# Patient Record
Sex: Female | Born: 1970 | Race: Black or African American | Hispanic: No | Marital: Single | Smoking: Former smoker
Health system: Southern US, Community
[De-identification: ages and names within clinical notes are randomized; demographics above are authoritative.]

## PROBLEM LIST (undated history)

## (undated) DIAGNOSIS — R519 Headache, unspecified: Secondary | ICD-10-CM

## (undated) DIAGNOSIS — I2510 Atherosclerotic heart disease of native coronary artery without angina pectoris: Secondary | ICD-10-CM

## (undated) DIAGNOSIS — I219 Acute myocardial infarction, unspecified: Secondary | ICD-10-CM

## (undated) DIAGNOSIS — O2230 Deep phlebothrombosis in pregnancy, unspecified trimester: Secondary | ICD-10-CM

## (undated) HISTORY — DX: Headache, unspecified: R51.9

---

## 2015-05-09 HISTORY — PX: CORONARY STENT PLACEMENT: SHX1402

## 2017-11-04 ENCOUNTER — Emergency Department (HOSPITAL_COMMUNITY)
Admission: EM | Admit: 2017-11-04 | Discharge: 2017-11-04 | Disposition: A | Discharge status: Home / Self Care | Payer: Self-pay | Attending: Emergency Medicine | Admitting: Emergency Medicine

## 2017-11-04 ENCOUNTER — Encounter (HOSPITAL_COMMUNITY): Payer: Self-pay

## 2017-11-04 ENCOUNTER — Emergency Department (HOSPITAL_BASED_OUTPATIENT_CLINIC_OR_DEPARTMENT_OTHER)
Admit: 2017-11-04 | Discharge: 2017-11-04 | Disposition: A | Discharge status: Home / Self Care | Payer: Self-pay | Attending: Emergency Medicine | Admitting: Emergency Medicine

## 2017-11-04 ENCOUNTER — Other Ambulatory Visit: Payer: Self-pay

## 2017-11-04 ENCOUNTER — Emergency Department (HOSPITAL_COMMUNITY): Payer: Self-pay

## 2017-11-04 DIAGNOSIS — I252 Old myocardial infarction: Secondary | ICD-10-CM | POA: Insufficient documentation

## 2017-11-04 DIAGNOSIS — Z79899 Other long term (current) drug therapy: Secondary | ICD-10-CM | POA: Insufficient documentation

## 2017-11-04 DIAGNOSIS — M79609 Pain in unspecified limb: Secondary | ICD-10-CM

## 2017-11-04 DIAGNOSIS — M79605 Pain in left leg: Secondary | ICD-10-CM | POA: Insufficient documentation

## 2017-11-04 DIAGNOSIS — M7989 Other specified soft tissue disorders: Secondary | ICD-10-CM

## 2017-11-04 HISTORY — DX: Deep phlebothrombosis in pregnancy, unspecified trimester: O22.30

## 2017-11-04 HISTORY — DX: Acute myocardial infarction, unspecified: I21.9

## 2017-11-04 LAB — CBC
HCT: 36.5 % (ref 36.0–46.0)
Hemoglobin: 12.2 g/dL (ref 12.0–15.0)
MCH: 29.7 pg (ref 26.0–34.0)
MCHC: 33.4 g/dL (ref 30.0–36.0)
MCV: 88.8 fL (ref 78.0–100.0)
Platelets: 384 10*3/uL (ref 150–400)
RBC: 4.11 MIL/uL (ref 3.87–5.11)
RDW: 13.3 % (ref 11.5–15.5)
WBC: 5.1 10*3/uL (ref 4.0–10.5)

## 2017-11-04 LAB — BASIC METABOLIC PANEL
Anion gap: 10 (ref 5–15)
BUN: 10 mg/dL (ref 6–20)
CO2: 23 mmol/L (ref 22–32)
Calcium: 9 mg/dL (ref 8.9–10.3)
Chloride: 106 mmol/L (ref 101–111)
Creatinine, Ser: 0.77 mg/dL (ref 0.44–1.00)
GFR calc Af Amer: >60 mL/min (ref >60)
GFR calc non Af Amer: >60 mL/min (ref >60)
Glucose, Bld: 90 mg/dL (ref 65–99)
Potassium: 3.7 mmol/L (ref 3.5–5.1)
Sodium: 139 mmol/L (ref 135–145)

## 2017-11-04 MED ORDER — TRAMADOL HCL 50 MG PO TABS
50.0000 mg | ORAL_TABLET | Freq: Once | ORAL | Status: AC
  Administered 2017-11-04: 50 mg via ORAL
  Filled 2017-11-04: qty 1

## 2017-11-04 MED ORDER — TRAMADOL HCL 50 MG PO TABS: 50.0000 mg | ORAL_TABLET | Freq: Four times a day (QID) | ORAL | 0 refills | Status: DC | PRN

## 2017-11-04 MED ORDER — IBUPROFEN 200 MG PO TABS
600.0000 mg | ORAL_TABLET | Freq: Once | ORAL | Status: AC
  Administered 2017-11-04: 600 mg via ORAL
  Filled 2017-11-04: qty 1

## 2017-11-04 NOTE — ED Triage Notes (Signed)
Pt states she has pain and swelling to her left leg/knee area. She has hx of dvt and wants to be evaluated. She states the pain is worse today and worse with walking, described as sharp pain.

## 2017-11-04 NOTE — Progress Notes (Signed)
VASCULAR LAB PRELIMINARY  PRELIMINARY  PRELIMINARY  PRELIMINARY  Left lower extremity venous duplex completed.    Preliminary report:  There is no DVT noted in the left lower extremity.  There is age indeterminate superficial thrombosis noted throughout the left greater saphenous vein from ankle to just below the saphenofemoral junction.   Called report to Cathie Olden, Kileigh Ortmann, RVT 11/04/2017, 2:49 PM

## 2017-11-04 NOTE — ED Provider Notes (Addendum)
[facility] EMERGENCY DEPARTMENT Provider Note   CSN: [id number] Arrival date & time: 11/04/17  1208     History   Chief Complaint Chief Complaint  Patient presents with  . Leg Pain    HPI Kristina Krause is a 47 y.o. female.  HPI  47 year old female presents today with complaints of left leg pain.  Patient notes a significant past medical history of DVT.  She notes that this was after a pregnancy.  She notes since that time she has had chronic left lower leg swelling.  She notes the swelling in her leg often waxes and wanes but is always present, she notes today swelling is identical to her baseline with no changes.  She notes over the last 4 months she has had worsening pain in the left leg, she notes it originally started in the bottom of her foot, but now is having pain in her knee.  She reports is a constant ache, worse with movement, worse with ambulation.  She denies any significant fever, warmth to touch or redness.  Patient denies any trauma to the knee.  Past Medical History:  Diagnosis Date  . 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 Myocardial infarct (HCC)     There are no active problems to display for this patient.   History reviewed. No pertinent surgical history.   OB History   None      Home Medications    Prior to Admission medications   Medication Sig Start Date End Date Taking? Authorizing Provider  clopidogrel (PLAVIX) 75 MG tablet Take 75 mg by mouth daily.   Yes [provider]  ibuprofen (ADVIL,MOTRIN) 200 MG tablet Take 600 mg by mouth every 6 (six) hours as needed (for pain).   Yes [provider]  traMADol (ULTRAM) 50 MG tablet Take 1 tablet (50 mg total) by mouth every 6 (six) hours as needed. 11/04/17   Eyvonne Mechanic, PA-C    Family History History reviewed. No pertinent family history.  Social History Social History   Tobacco Use  . Smoking status: Never Smoker  . Smokeless tobacco:  Never Used  Substance Use Topics  . Alcohol use: Yes    Comment: occ  . Drug use: Not on file     Allergies   Aspirin   Review of Systems Review of Systems  All other systems reviewed and are negative.  Physical Exam Updated Vital Signs BP 122/63 (BP Location: Left Arm)   Pulse 70   Temp 98.3 F (36.8 C) (Oral)   Resp 18   LMP 11/04/2017   SpO2 100%   Physical Exam  Constitutional: She is oriented to person, place, and time. She appears well-developed and well-nourished.  HENT:  Head: Normocephalic and atraumatic.  Eyes: Pupils are equal, round, and reactive to light. Conjunctivae are normal. Right eye exhibits no discharge. Left eye exhibits no discharge. No scleral icterus.  Neck: Normal range of motion. No JVD present. No tracheal deviation present.  Pulmonary/Chest: Effort normal. No stridor.  Musculoskeletal:  Left leg with edema, no warmth to touch, tenderness in the posterior calf and knee diffusely, no redness, patient has flexion greater than 90 degrees- pedal pulse two plus cap refill less than 2 sec  Neurological: She is alert and oriented to person, place, and time. Coordination normal.  Psychiatric: She has a normal mood and affect. Her behavior is normal. Judgment and thought content normal.  Nursing note and vitals reviewed.  ED Treatments / Results  Labs (all labs ordered are listed, but only abnormal results are displayed) Labs Reviewed  CBC  BASIC METABOLIC PANEL    EKG None  Radiology Dg Knee Complete 4 Views Left  Result Date: 11/04/2017 CLINICAL DATA:  Left knee pain for 3 weeks without known injury. EXAM: LEFT KNEE - COMPLETE 4+ VIEW COMPARISON:  None. FINDINGS: No evidence of fracture, dislocation, or joint effusion. No evidence of arthropathy or other focal bone abnormality. Soft tissues are unremarkable. IMPRESSION: Normal left knee. Electronically Signed   By: Lupita Raider, M.D.   On: 11/04/2017 16:32    Procedures Procedures  (including critical care time)  Medications Ordered in ED Medications  ibuprofen (ADVIL,MOTRIN) tablet 600 mg (600 mg Oral Given 11/04/17 1525)     Initial Impression / Assessment and Plan / ED Course  I have reviewed the triage vital signs and the nursing notes.  Pertinent labs & imaging results that were available during my care of the patient were reviewed by me and considered in my medical decision making (see chart for details).       Labs: CBC, BMP  Imaging: Lower extremity venous, DG knee  Consults:  Therapeutics: Ibuprofen  Discharge Meds: Ultram  Assessment/Plan: 47 year old female presents today with complaints of leg and knee pain.  Patient has chronic edema to the left lower extremity this is unchanged.  She has no signs of infection, no signs of septic arthritis, no DVT.  Patient does have superficial thrombosis.  Patient with good perfusion to the distal extremity.  Patient will have outpatient follow-up with orthopedics, she is given strict return precautions, she verbalized understanding and agreement to today's plan.    Final Clinical Impressions(s) / ED Diagnoses   Final diagnoses:  Left leg pain    ED Discharge Orders        Ordered    traMADol (ULTRAM) 50 MG tablet  Every 6 hours PRN     11/04/17 1708       HedgesTinnie Gens, PA-C 11/04/17 1709    Mykira Hofmeister, Tinnie Gens, PA-C 11/04/17 1718    Pricilla Loveless, MD 11/10/17 [phone]

## 2017-11-04 NOTE — ED Notes (Signed)
Patient transported to X-ray 

## 2017-11-04 NOTE — ED Provider Notes (Signed)
Patient placed in Quick Look pathway, seen and evaluated   Chief Complaint: Pain and swelling of left lower leg  HPI:   Kristina Krause is a 47 y.o. female with history of previous DVT who presents for evaluation of pain and swelling to her left leg.  Patient is not currently on any blood thinners.  Reports she has chronic swelling but over the past several days it has been worsening and she does not typically have pain but reports constant aching pain in the left lower leg.  Denies chest pain or shortness of breath.  ROS: + leg swelling and pain, - fevers or redness, - chest pain, SOB  Physical Exam:   Gen: No distress  Neuro: Awake and Alert  Skin: Warm    Focused Exam: 2+ pitting edema over the left lower leg, no overlying erythema, the leg is tender to palpation, 2+ DP and TP pulses.   Initiation of care has begun. The patient has been counseled on the process, plan, and necessity for staying for the completion/evaluation, and the remainder of the medical screening examination.  DVT study and basic labs ordered from triage.    Dartha Lodge, PA-C 11/04/17 1246    Tegeler, Canary Brim, MD 11/04/17 [phone]

## 2017-11-04 NOTE — Discharge Instructions (Addendum)
Please read attached information. If you experience any new or worsening signs or symptoms please return to the emergency room for evaluation. Please follow-up with your primary care provider or specialist as discussed. Please use medication prescribed only as directed and discontinue taking if you have any concerning signs or symptoms.   °

## 2017-11-04 NOTE — ED Notes (Addendum)
Pt verbalizes understanding of d/c instructions. Pt received prescriptions. Pt taken to lobby in wheelchair at d/c with all belongings.   

## 2017-11-10 ENCOUNTER — Emergency Department (HOSPITAL_COMMUNITY)
Admission: EM | Admit: 2017-11-10 | Discharge: 2017-11-10 | Discharge status: Against Medical Advice | Payer: Self-pay | Attending: Emergency Medicine | Admitting: Emergency Medicine

## 2017-11-10 ENCOUNTER — Emergency Department (HOSPITAL_BASED_OUTPATIENT_CLINIC_OR_DEPARTMENT_OTHER)
Admit: 2017-11-10 | Discharge: 2017-11-10 | Disposition: A | Discharge status: Home / Self Care | Payer: Self-pay | Attending: Emergency Medicine | Admitting: Emergency Medicine

## 2017-11-10 ENCOUNTER — Encounter (HOSPITAL_COMMUNITY): Payer: Self-pay | Admitting: Emergency Medicine

## 2017-11-10 DIAGNOSIS — M79609 Pain in unspecified limb: Secondary | ICD-10-CM

## 2017-11-10 DIAGNOSIS — I8002 Phlebitis and thrombophlebitis of superficial vessels of left lower extremity: Secondary | ICD-10-CM | POA: Insufficient documentation

## 2017-11-10 DIAGNOSIS — Z86718 Personal history of other venous thrombosis and embolism: Secondary | ICD-10-CM | POA: Insufficient documentation

## 2017-11-10 DIAGNOSIS — Z7982 Long term (current) use of aspirin: Secondary | ICD-10-CM | POA: Insufficient documentation

## 2017-11-10 DIAGNOSIS — Z7901 Long term (current) use of anticoagulants: Secondary | ICD-10-CM | POA: Insufficient documentation

## 2017-11-10 MED ORDER — IBUPROFEN 600 MG PO TABS: 600.0000 mg | ORAL_TABLET | Freq: Four times a day (QID) | ORAL | 0 refills | Status: DC | PRN

## 2017-11-10 MED ORDER — ACETAMINOPHEN 325 MG PO TABS
650.0000 mg | ORAL_TABLET | Freq: Once | ORAL | Status: AC
  Administered 2017-11-10: 650 mg via ORAL
  Filled 2017-11-10: qty 2

## 2017-11-10 NOTE — Progress Notes (Signed)
Left lower extremity venous duplex has been completed. Negative for DVT. There is evidence of age indeterminate superficial vein thrombosis involving the great saphenous vein of the left lower extremity. Results were given to Fayrene HelperBowie Tran PA.  11/10/17 2:56 PM Kristina Krause RVT

## 2017-11-10 NOTE — ED Provider Notes (Signed)
[facility] EMERGENCY DEPARTMENT Provider Note   CSN: [id number] Arrival date & time: 11/10/17  0805     History   Chief Complaint Chief Complaint  Patient presents with  . Leg Pain    HPI Kristina Krause is a 47 y.o. female.  HPI   47 year old female with history of prior DVT, anxiety, presenting for evaluation of left knee pain.  Patient states she is always had trouble with her left leg with swelling and pain however it has increased in size and discomfort for more than a week.  Pain is primarily to the back of her left knee and thigh described as a throbbing heavy sensation worsening with prolonged standing.  At rest, pain did improve.  There is no associated fever, chills, lightheadedness, dizziness, chest pain, shortness of breath, productive cough, hemoptysis.  She denies any back pain, bowel bladder incontinence or saddle anesthesia.  No report of numbness or weakness.  Denies any recent injury.  Patient was seen on 11/04/2017 for her symptoms.  X-ray of her left knee, as well as a DVT study was performed which shows no definite evidence of DVT.  However there is evidence of superficial thrombophlebitis.  Patient was discharged home with pain medication.  Return again today stating that her symptoms have not improved.  She has been able to walk much, and currently using crutches.  Is requesting for work note.  Past Medical History:  Diagnosis Date  . 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 Myocardial infarct (HCC)     There are no active problems to display for this patient.   History reviewed. No pertinent surgical history.   OB History   None      Home Medications    Prior to Admission medications   Medication Sig Start Date End Date Taking? Authorizing Provider  aspirin EC 81 MG tablet Take 81 mg by mouth daily.   Yes [provider]  clopidogrel (PLAVIX) 75 MG tablet Take 75 mg by mouth daily.   Yes [provider]    lisinopril (PRINIVIL,ZESTRIL) 2.5 MG tablet Take 2.5 mg by mouth daily.   Yes [provider]  Metoprolol Succinate 25 MG CS24 Take 25 mg by mouth daily.   Yes [provider]  traMADol (ULTRAM) 50 MG tablet Take 1 tablet (50 mg total) by mouth every 6 (six) hours as needed. Patient taking differently: Take 50 mg by mouth every 6 (six) hours as needed for moderate pain.  11/04/17  Yes Hedges, Tinnie Gens, PA-C    Family History No family history on file.  Social History Social History   Tobacco Use  . Smoking status: Never Smoker  . Smokeless tobacco: Never Used  Substance Use Topics  . Alcohol use: Yes    Comment: occ  . Drug use: Not on file     Allergies   Aspirin   Review of Systems Review of Systems  All other systems reviewed and are negative.    Physical Exam Updated Vital Signs BP 115/72 (BP Location: Right Arm)   Pulse 62   Temp 98.2 F (36.8 C) (Oral)   Resp 17   LMP 11/04/2017   SpO2 99%   Physical Exam  Constitutional: She appears well-developed and well-nourished. No distress.  HENT:  Head: Atraumatic.  Eyes: Conjunctivae are normal.  Neck: Neck supple.  Cardiovascular: Normal rate, regular rhythm and intact distal pulses.  Pulmonary/Chest: Effort normal and breath sounds normal.  Musculoskeletal: She exhibits edema (  Left lower extremity: Nonpitting edema throughout  as compared to right, tenderness to popliteal fossa and posterior calf without palpable cords erythema or rash.  Knee is nontender to palpation with normal flexion and extension.).  Left lower extremity compartments are soft  Neurological: She is alert.  Skin: No rash noted.  Psychiatric: She has a normal mood and affect.  Nursing note and vitals reviewed.    ED Treatments / Results  Labs (all labs ordered are listed, but only abnormal results are displayed) Labs Reviewed - No data to display  EKG None  Radiology No results found.  Procedures Procedures  (including critical care time)  Medications Ordered in ED Medications  acetaminophen (TYLENOL) tablet 650 mg (has no administration in time range)     Initial Impression / Assessment and Plan / ED Course  I have reviewed the triage vital signs and the nursing notes.  Pertinent labs & imaging results that were available during my care of the patient were reviewed by me and considered in my medical decision making (see chart for details).     BP 115/72 (BP Location: Right Arm)   Pulse 62   Temp 98.2 F (36.8 C) (Oral)   Resp 17   LMP 11/04/2017   SpO2 99%    Final Clinical Impressions(s) / ED Diagnoses   Final diagnoses:  Superficial thrombophlebitis of left leg    ED Discharge Orders    None     3:53 PM Patient here with continued discomfort and pain involving her left lower extremity.  She was previously diagnosed with superficial thrombophlebitis.  She report history of DVT and was voicing concern.  A repeat ultrasound performed today without evidence of DVT but redemonstrate superficial thrombophlebitis.  Encourage warm compress, anti-inflammatory medication as needed.  Outpatient follow-up with PCP recommended.  Work note provided as requested.  I do not see any evidence of infection.  No significant discomfort with her knee concerning for bony or joint pathology.   Fayrene Helperran, Tymber Stallings, PA-C 11/10/17 1557    Eber HongMiller, Brian, MD 11/11/17 [phone]

## 2017-11-10 NOTE — Discharge Instructions (Signed)
Apply warm moist compress over the area of pain 3-5 times a day until symptoms improve.  Take ibuprofen with food as needed for pain.  Use number below to find a primary care provider for further management of your health.

## 2017-11-10 NOTE — ED Triage Notes (Addendum)
Patient complains of left knee pain started last Thursday, patient states when she puts pressure on the knee sharp pain increases. Seen for same last Thursday, negative xray, vascular duplex negative for DVT but showed "age indeterminate superficial thrombosis noted throughout the left greater saphenous vein from ankle to just below the saphenofemoral junction". Denies shortness of breath, denies any other symptoms. Alert and oriented and in no apparent distress. Reports history of DVT, patient takes plavix daily.

## 2017-11-10 NOTE — ED Notes (Addendum)
Pt came to this RN and stated she was going to another ED because her Ultra sound wasn't happening quickly enough. Chris PA met pt in hallway to explain risks of leaving without US or possible treatment. Pt became defensive and stated "We were looking at her like she was crazy" and patient then walked down hallway and left.  

## 2017-11-10 NOTE — ED Notes (Addendum)
Pt sitting in room, changing into gown.

## 2017-11-10 NOTE — ED Notes (Signed)
Patient transported to Ultrasound 

## 2017-11-10 NOTE — ED Notes (Signed)
US called and told Pt is still here and in room 9.

## 2017-11-10 NOTE — ED Notes (Signed)
[name] spoke with patient about waiting for vascular US and informed pt she would call vascular and get and ETA.

## 2017-11-22 ENCOUNTER — Encounter (HOSPITAL_COMMUNITY): Payer: Self-pay | Admitting: Emergency Medicine

## 2017-11-22 ENCOUNTER — Emergency Department (HOSPITAL_COMMUNITY)
Admission: EM | Admit: 2017-11-22 | Discharge: 2017-11-22 | Disposition: A | Discharge status: Home / Self Care | Payer: Self-pay | Attending: Emergency Medicine | Admitting: Emergency Medicine

## 2017-11-22 DIAGNOSIS — Z7902 Long term (current) use of antithrombotics/antiplatelets: Secondary | ICD-10-CM | POA: Insufficient documentation

## 2017-11-22 DIAGNOSIS — Z7982 Long term (current) use of aspirin: Secondary | ICD-10-CM | POA: Insufficient documentation

## 2017-11-22 DIAGNOSIS — Z79899 Other long term (current) drug therapy: Secondary | ICD-10-CM | POA: Insufficient documentation

## 2017-11-22 DIAGNOSIS — M25562 Pain in left knee: Secondary | ICD-10-CM | POA: Insufficient documentation

## 2017-11-22 NOTE — ED Triage Notes (Signed)
Per pt, states left leg pain for a couple of weeks-[facility]-states she was negative for DVT-states now having left knee pain-states it's "clicking"-no injury or trauma

## 2017-11-22 NOTE — ED Notes (Signed)
ED Provider at bedside. 

## 2017-11-22 NOTE — Discharge Instructions (Addendum)
Take Tylenol or other over-the-counter pain medicine as needed for pain.  Contact a doctor if: The pain does not stop. The pain changes or gets worse. You have a fever along with knee pain. Your knee gives out or locks up. Your knee swells, and becomes worse. Get help right away if: Your knee feels warm. You cannot move your knee. You have very bad knee pain. You have chest pain. You have trouble breathing. You develop a fever. You have an increased amount of swelling or color change.

## 2017-11-22 NOTE — ED Provider Notes (Addendum)
[facility]-EMERGENCY DEPT Provider Note   CSN: [id number] Arrival date & time: 11/22/17  0907     History   Chief Complaint Chief Complaint  Patient presents with  . Knee Pain    HPI Tienna Krause is a 47 y.o. female presenting for 2 weeks of left knee pain.  Patient has been seen in the emergency department twice in the last month for the same pain.  Patient states that pain is a sharp popping sensation along the medial side of her left knee.  Patient states that pain is 8/10 when it comes on while walking.  Patient states that she has taken ibuprofen for pain but this is not helped.  Patient states that pain is worse with walking and goes away when she is sitting or laying.  Patient is not in pain at this time. Patient has a pertinent past medical history of deep vein thrombosis of the left leg.  Patient has had 2 Doppler ultrasounds in the past month both negative for deep vein thromboses.  Patient states that this episode of pain does not feel like a DVT, she states is concerned about her meniscus.  Pertinent negatives fever, color change, warmth, rash, swelling, history of trauma.  HPI  Past Medical History:  Diagnosis Date  . 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 Myocardial infarct (HCC)     There are no active problems to display for this patient.   History reviewed. No pertinent surgical history.   OB History   None      Home Medications    Prior to Admission medications   Medication Sig Start Date End Date Taking? Authorizing Provider  aspirin EC 81 MG tablet Take 81 mg by mouth daily.    [provider]  clopidogrel (PLAVIX) 75 MG tablet Take 75 mg by mouth daily.    [provider]  ibuprofen (ADVIL,MOTRIN) 600 MG tablet Take 1 tablet (600 mg total) by mouth every 6 (six) hours as needed for moderate pain. 11/10/17   Fayrene Helper, PA-C  lisinopril (PRINIVIL,ZESTRIL) 2.5 MG tablet Take 2.5 mg by mouth daily.     [provider]  Metoprolol Succinate 25 MG CS24 Take 25 mg by mouth daily.    [provider]  traMADol (ULTRAM) 50 MG tablet Take 1 tablet (50 mg total) by mouth every 6 (six) hours as needed. Patient taking differently: Take 50 mg by mouth every 6 (six) hours as needed for moderate pain.  11/04/17   Eyvonne Mechanic, PA-C    Family History No family history on file.  Social History Social History   Tobacco Use  . Smoking status: Never Smoker  . Smokeless tobacco: Never Used  Substance Use Topics  . Alcohol use: Yes    Comment: occ  . Drug use: Not on file     Allergies   Aspirin   Review of Systems Review of Systems  Constitutional: Negative.  Negative for chills, fatigue and fever.  HENT: Negative.  Negative for congestion, ear pain, rhinorrhea, sore throat and trouble swallowing.   Eyes: Negative.  Negative for visual disturbance.  Respiratory: Negative.  Negative for cough, chest tightness, shortness of breath and wheezing.   Cardiovascular: Negative.  Negative for chest pain and leg swelling.  Gastrointestinal: Negative.  Negative for abdominal pain, blood in stool, diarrhea, nausea and vomiting.  Genitourinary: Negative.  Negative for dysuria, flank pain, hematuria and pelvic pain.  Musculoskeletal:  Left knee pain.  Skin: Negative.  Negative for color change and rash.  Neurological: Negative.  Negative for dizziness, syncope, weakness, light-headedness and headaches.     Physical Exam Updated Vital Signs BP 137/65 (BP Location: Right Arm)   Pulse 74   Temp 98 F (36.7 C) (Oral)   Resp 17   LMP 11/04/2017   SpO2 100%   Physical Exam  Constitutional: She is oriented to person, place, and time. She appears well-developed and well-nourished. No distress.  HENT:  Head: Normocephalic and atraumatic.  Neck: Normal range of motion. Neck supple. No tracheal deviation present.  Cardiovascular: Normal rate, regular rhythm, normal heart  sounds and intact distal pulses.  Pulmonary/Chest: Effort normal and breath sounds normal. No respiratory distress.  Abdominal: Soft. There is no tenderness.  Musculoskeletal: Normal range of motion.       Left knee: She exhibits no MCL laxity. Tenderness found. Medial joint line tenderness noted. No lateral joint line, no MCL, no LCL and no patellar tendon tenderness noted.  Left lower leg below knee is diffusely swollen more than the right leg.  Patient states that this is her normal amount of swelling of her left leg. No color change, no warmth, no palpable cords negative Homans bilaterally.  Lower legs, are not tender to palpation bilaterally.  Pedal pulses are intact bilaterally.  Lower extremity sensation/movement/strength intact bilaterally.  Left knee: Mild medial joint line tenderness to palpation.  McMurray test positive for pain, no clicking or popping or catching noted.  Negative anterior/posterior drawer.  Neurological: She is alert and oriented to person, place, and time.  Skin: Skin is dry. Capillary refill takes less than 2 seconds.  Psychiatric: She has a normal mood and affect. Her behavior is normal.     ED Treatments / Results  Labs (all labs ordered are listed, but only abnormal results are displayed) Labs Reviewed - No data to display  EKG None  Radiology No results found.  Procedures Procedures (including critical care time)  Medications Ordered in ED Medications - No data to display   Initial Impression / Assessment and Plan / ED Course  I have reviewed the triage vital signs and the nursing notes.  Pertinent labs & imaging results that were available during my care of the patient were reviewed by me and considered in my medical decision making (see chart for details).    Left knee pain: Patient presenting with 2 weeks of left knee pain.  History of present illness and physical exam suggests likely musculoskeletal etiology of left knee pain.  Imaging  from previous visit shows no acute abnormalities.  Patient denies any new trauma or injury to the knee since that visit.  Patient has also had 2 vascular ultrasound lower extremity venous studies this month.  Both studies show no evidence for a DVT.  Patient denies any new swelling to the lower left leg, color change, warmth that would suggest new DVT.  Patient denies shortness of breath, chest pain, syncope, headache, dyspnea.  I believe the patient is safe for discharge with outpatient follow-up to orthopedic specialist, referral given.  [facility]  Patient informed that she can take over-the-counter anti-inflammatories for her pain.  Patient given extensive return precautions in room regarding DVT and septic joint symptoms.  Patient states understanding of return precautions, patient also given return precautions handout and AVS. Patient is agreeable with plan.  Patient also seen by Dr. Patria Maneampos who  agrees with plan for discharge with follow-up.    Final Clinical Impressions(s) / ED Diagnoses   Final diagnoses:  Acute pain of left knee    ED Discharge Orders    None       Elizabeth Palau 11/22/17 1250    Elizabeth Palau 11/22/17 1253    Azalia Bilis, MD 11/23/17 [phone]

## 2019-04-14 ENCOUNTER — Emergency Department (HOSPITAL_COMMUNITY): Payer: Self-pay

## 2019-04-14 ENCOUNTER — Encounter (HOSPITAL_COMMUNITY): Payer: Self-pay | Admitting: Emergency Medicine

## 2019-04-14 ENCOUNTER — Other Ambulatory Visit: Payer: Self-pay

## 2019-04-14 ENCOUNTER — Emergency Department (HOSPITAL_COMMUNITY)
Admission: EM | Admit: 2019-04-14 | Discharge: 2019-04-14 | Disposition: A | Discharge status: Home / Self Care | Payer: Self-pay | Attending: Emergency Medicine | Admitting: Emergency Medicine

## 2019-04-14 DIAGNOSIS — Z7902 Long term (current) use of antithrombotics/antiplatelets: Secondary | ICD-10-CM | POA: Insufficient documentation

## 2019-04-14 DIAGNOSIS — Z955 Presence of coronary angioplasty implant and graft: Secondary | ICD-10-CM | POA: Insufficient documentation

## 2019-04-14 DIAGNOSIS — Z79899 Other long term (current) drug therapy: Secondary | ICD-10-CM | POA: Insufficient documentation

## 2019-04-14 DIAGNOSIS — Z7982 Long term (current) use of aspirin: Secondary | ICD-10-CM | POA: Insufficient documentation

## 2019-04-14 DIAGNOSIS — R002 Palpitations: Secondary | ICD-10-CM | POA: Insufficient documentation

## 2019-04-14 DIAGNOSIS — I252 Old myocardial infarction: Secondary | ICD-10-CM | POA: Insufficient documentation

## 2019-04-14 LAB — I-STAT BETA HCG BLOOD, ED (MC, WL, AP ONLY): I-stat hCG, quantitative: <5 m[IU]/mL (ref <5)

## 2019-04-14 LAB — CBC
HCT: 37.9 % (ref 36.0–46.0)
Hemoglobin: 12.7 g/dL (ref 12.0–15.0)
MCH: 31.3 pg (ref 26.0–34.0)
MCHC: 33.5 g/dL (ref 30.0–36.0)
MCV: 93.3 fL (ref 80.0–100.0)
Platelets: 362 10*3/uL (ref 150–400)
RBC: 4.06 MIL/uL (ref 3.87–5.11)
RDW: 11.9 % (ref 11.5–15.5)
WBC: 6.9 10*3/uL (ref 4.0–10.5)
nRBC: 0 % (ref 0.0–0.2)

## 2019-04-14 LAB — BASIC METABOLIC PANEL
Anion gap: 8 (ref 5–15)
BUN: 9 mg/dL (ref 6–20)
CO2: 23 mmol/L (ref 22–32)
Calcium: 9.2 mg/dL (ref 8.9–10.3)
Chloride: 104 mmol/L (ref 98–111)
Creatinine, Ser: 0.7 mg/dL (ref 0.44–1.00)
GFR calc Af Amer: >60 mL/min (ref >60)
GFR calc non Af Amer: >60 mL/min (ref >60)
Glucose, Bld: 97 mg/dL (ref 70–99)
Potassium: 4 mmol/L (ref 3.5–5.1)
Sodium: 135 mmol/L (ref 135–145)

## 2019-04-14 LAB — TROPONIN I (HIGH SENSITIVITY): Troponin I (High Sensitivity): 3 ng/L (ref <18)

## 2019-04-14 MED ORDER — METOPROLOL SUCCINATE 25 MG PO CS24: 25.0000 mg | EXTENDED_RELEASE_CAPSULE | Freq: Every day | ORAL | 0 refills | Status: DC

## 2019-04-14 MED ORDER — HYDROXYZINE HCL 25 MG PO TABS: 25.0000 mg | ORAL_TABLET | Freq: Four times a day (QID) | ORAL | 0 refills | Status: DC

## 2019-04-14 MED ORDER — SODIUM CHLORIDE 0.9% FLUSH: 3.0000 mL | Freq: Once | INTRAVENOUS | Status: DC

## 2019-04-14 MED ORDER — CLOPIDOGREL BISULFATE 75 MG PO TABS: 75.0000 mg | ORAL_TABLET | Freq: Every day | ORAL | 0 refills | Status: DC

## 2019-04-14 MED ORDER — LISINOPRIL 2.5 MG PO TABS: 2.5000 mg | ORAL_TABLET | Freq: Every day | ORAL | 0 refills | Status: DC

## 2019-04-14 NOTE — Discharge Instructions (Addendum)
You were seen in the ER for palpitations and increased anxiety.  Work-up included chest x-ray, EKG, heart enzymes and screening lab work.  These were negative and normal.  I do not think symptoms are related to your heart or heart attack.  Common causes of palpitations and increased anxiety is poor sleep, increased caffeine intake, dehydration or other social stressors.  I have given you prescription for hydroxyzine 25 mg.  Please take this every 6 hours for the next few days to see if your symptoms improve.  This medicine can help calm you down and help manage anxiety.  I have refilled your medicines.  You need to establish care with a cardiologist, call [facility] to make an appointment for routine follow-up there.  Make an appointment with the primary care doctor who can assess degree of anxiety and help you manage this long-term  Return to the ER immediately if there is chest pain, shortness of breath, palpitations, lightheadedness or passing out with exertion or activity

## 2019-04-14 NOTE — ED Triage Notes (Addendum)
Pt reports for the past two weeks she has been very anxious, intermittent panic attacks and palpitations. Pt reports she had a heart attack 4 years ago with a stent placement. Denies chest pain. Hx of anxiety, no medication regimen. Reports decreased palpitations at this time.

## 2019-04-14 NOTE — ED Provider Notes (Signed)
[facility]   CSN: [id number] Arrival date & time: 04/14/19  1633     History   Chief Complaint Chief Complaint  Patient presents with  . Anxiety  . Panic Attack  . Palpitations    HPI Kristina Krause is a 48 y.o. female with history of DVT, MIs s/p stent 2014 here for evaluation of "severe anxiety".  Onset 3 to 4 weeks ago.  Reports intermittent palpitations that occur randomly throughout the day.  She has associated anxiety, "feeling on the edge" and uneasiness during palpitations.  No chest pain or SOB or light headedness or syncope with this.  Her symptoms have happened randomly throughout the day while driving, trying to sleep.  Has also noticed insomnia and difficulty resting and falling asleep.  Reports 20 years ago after she delivered her daughter she was diagnosed with a DVT.  At that time she developed very similar symptoms including anxiety and palpitations.  This went on for several years, untreated and eventually resolved.  Her symptoms came back again 4 years ago when she had an MI and had a stent.  She goes on frequent walks in the park and states palpitations and anxiety are never exertional.  Denies any significant stressors in her life or significant life, family, financial stressors.  She is not sure why she would be anxious.  She was prescribed medicines for anxiety in the past but did not want to take them.  She drinks 1 cup of coffee every morning.  Occasional alcohol use socially.  No tobacco use.  Admits she has missed her metoprolol, lisinopril and Plavix for last 1 month because her primary care doctor is not in the area and is not able to refill her medicines.  She denies illicit drug use.  No recent illnesses.  No associated exertional chest pain, shortness of breath.  No syncope.  No lower extremity edema, calf tenderness.  Reports chronic left lower extremity swelling since diagnosis of DVT but this is unchanged.   Denies depressed mood, suicidal ideations or plan.     HPI  Past Medical History:  Diagnosis Date  . DVT (deep vein thrombosis) in pregnancy   . Myocardial infarct (HCC)     There are no active problems to display for this patient.   Past Surgical History:  Procedure Laterality Date  . CORONARY STENT PLACEMENT  05/2015     OB History   No obstetric history on file.      Home Medications    Prior to Admission medications   Medication Sig Start Date End Date Taking? Authorizing Provider  aspirin EC 81 MG tablet Take 81 mg by mouth daily.    [provider]  clopidogrel (PLAVIX) 75 MG tablet Take 1 tablet (75 mg total) by mouth daily. 04/14/19   Liberty HandyGibbons, Earnest Mcgillis J, PA-C  hydrOXYzine (ATARAX/VISTARIL) 25 MG tablet Take 1 tablet (25 mg total) by mouth every 6 (six) hours. 04/14/19   Liberty HandyGibbons, Latrica Clowers J, PA-C  ibuprofen (ADVIL,MOTRIN) 600 MG tablet Take 1 tablet (600 mg total) by mouth every 6 (six) hours as needed for moderate pain. 11/10/17   Fayrene Helperran, Bowie, PA-C  lisinopril (ZESTRIL) 2.5 MG tablet Take 1 tablet (2.5 mg total) by mouth daily. 04/14/19   Liberty HandyGibbons, Catalino Plascencia J, PA-C  Metoprolol Succinate 25 MG CS24 Take 25 mg by mouth daily. 04/14/19   Liberty HandyGibbons, Jemia Fata J, PA-C  traMADol (ULTRAM) 50 MG tablet Take 1 tablet (50 mg total) by mouth every  6 (six) hours as needed. Patient taking differently: Take 50 mg by mouth every 6 (six) hours as needed for moderate pain.  11/04/17   Okey Regal, PA-C    Family History No family history on file.  Social History Social History   Tobacco Use  . Smoking status: Never Smoker  . Smokeless tobacco: Never Used  Substance Use Topics  . Alcohol use: Yes    Comment: occ  . Drug use: Not on file     Allergies   Aspirin and Ibuprofen   Review of Systems Review of Systems  Cardiovascular: Positive for palpitations.  Psychiatric/Behavioral: Positive for sleep disturbance. The patient is nervous/anxious.   All other systems  reviewed and are negative.    Physical Exam Updated Vital Signs BP 128/83 (BP Location: Left Arm)   Pulse 72   Temp 98.3 F (36.8 C) (Oral)   Resp 16   Ht 5\' 5"  (1.651 m)   Wt 84.4 kg   LMP 04/07/2019   SpO2 98%   BMI 30.95 kg/m   Physical Exam Vitals signs and nursing note reviewed.  Constitutional:      General: She is not in acute distress.    Appearance: She is well-developed.     Comments: NAD.  HENT:     Head: Normocephalic and atraumatic.     Right Ear: External ear normal.     Left Ear: External ear normal.     Nose: Nose normal.  Eyes:     General: No scleral icterus.    Conjunctiva/sclera: Conjunctivae normal.  Neck:     Musculoskeletal: Normal range of motion and neck supple.  Cardiovascular:     Rate and Rhythm: Normal rate and regular rhythm.     Heart sounds: Normal heart sounds.     Comments: 1+ radial and DP pulses bilaterally.  Trace ankle edema on the left.  No calf tenderness bilaterally.  No calf asymmetry. Pulmonary:     Effort: Pulmonary effort is normal.     Breath sounds: Normal breath sounds.  Musculoskeletal: Normal range of motion.        General: No deformity.  Skin:    General: Skin is warm and dry.     Capillary Refill: Capillary refill takes less than 2 seconds.  Neurological:     Mental Status: She is alert and oriented to person, place, and time.  Psychiatric:        Behavior: Behavior normal.        Thought Content: Thought content normal.        Judgment: Judgment normal.      ED Treatments / Results  Labs (all labs ordered are listed, but only abnormal results are displayed) Labs Reviewed  BASIC METABOLIC PANEL  CBC  I-STAT BETA HCG BLOOD, ED ([facility], [facility], [facility] ONLY)  TROPONIN I (HIGH SENSITIVITY)  TROPONIN I (HIGH SENSITIVITY)    EKG None  Radiology Dg Chest 2 View  Result Date: 04/14/2019 CLINICAL DATA:  Chest pain EXAM: CHEST - 2 VIEW COMPARISON:  None. FINDINGS: The heart size and mediastinal contours are within  normal limits. Both lungs are clear. The visualized skeletal structures are unremarkable. IMPRESSION: No acute abnormality of the lungs. Electronically Signed   By: Eddie Candle M.D.   On: 04/14/2019 18:51    Procedures Procedures (including critical care time)  Medications Ordered in ED Medications  sodium chloride flush (NS) 0.9 % injection 3 mL (has no administration in time range)     Initial Impression /  Assessment and Plan / ED Course  I have reviewed the triage vital signs and the nursing notes.  Pertinent labs & imaging results that were available during my care of the patient were reviewed by me and considered in my medical decision making (see chart for details).  I have reviewed patient's EMR.   She reports palpitations and anxiety but no associated CP, SOB, light headedness or syncope. None of her symptoms are on exertion.  CP focused work up initiated in triage by Charity fundraiser.  I have reviewed this and completely normal.    No significant electrolyte abnormalities, anemia, leukocytosis.  EKG without arrhythmias.  CXR negative. Trop undetectable.  VS including pulse and BP normal and stable here.   I considered life threatening process unlikely.  No findings in work up to suggest electrolyte or anemia related palpitations, arrhythmias.  She has no exertional symptoms, CP, SOB.  She reports social/event related anxiety int the past. She had similar symptoms when she had dx of DVT and MI.  Her anxiety may be a large component.    She has been out of chronic meds for 1 month, these were refilled. Given cardiac history recommended establishing care with [facility] cardiology for routine follow up.  Rx for hydroxyzine to help with anxiety given.  Return precautions given. She is in agreement with this.    Final Clinical Impressions(s) / ED Diagnoses   Final diagnoses:  Palpitations    ED Discharge Orders         Ordered    hydrOXYzine (ATARAX/VISTARIL) 25 MG tablet  Every 6 hours      04/14/19 2049    clopidogrel (PLAVIX) 75 MG tablet  Daily     04/14/19 2049    lisinopril (ZESTRIL) 2.5 MG tablet  Daily     04/14/19 2049    Metoprolol Succinate 25 MG CS24  Daily     04/14/19 2049           Jerrell Mylar 04/14/19 2253    Tilden Fossa, MD 04/15/19 0030

## 2019-04-17 ENCOUNTER — Emergency Department (HOSPITAL_COMMUNITY)
Admission: EM | Admit: 2019-04-17 | Discharge: 2019-04-17 | Disposition: A | Discharge status: Home / Self Care | Payer: Self-pay | Attending: Emergency Medicine | Admitting: Emergency Medicine

## 2019-04-17 ENCOUNTER — Emergency Department (HOSPITAL_COMMUNITY): Payer: Self-pay

## 2019-04-17 ENCOUNTER — Encounter (HOSPITAL_COMMUNITY): Payer: Self-pay | Admitting: Emergency Medicine

## 2019-04-17 DIAGNOSIS — I2510 Atherosclerotic heart disease of native coronary artery without angina pectoris: Secondary | ICD-10-CM | POA: Insufficient documentation

## 2019-04-17 DIAGNOSIS — Z7982 Long term (current) use of aspirin: Secondary | ICD-10-CM | POA: Insufficient documentation

## 2019-04-17 DIAGNOSIS — R079 Chest pain, unspecified: Secondary | ICD-10-CM

## 2019-04-17 DIAGNOSIS — R0789 Other chest pain: Secondary | ICD-10-CM | POA: Insufficient documentation

## 2019-04-17 LAB — CBC
HCT: 37.9 % (ref 36.0–46.0)
Hemoglobin: 12.7 g/dL (ref 12.0–15.0)
MCH: 30.8 pg (ref 26.0–34.0)
MCHC: 33.5 g/dL (ref 30.0–36.0)
MCV: 92 fL (ref 80.0–100.0)
Platelets: 352 10*3/uL (ref 150–400)
RBC: 4.12 MIL/uL (ref 3.87–5.11)
RDW: 11.9 % (ref 11.5–15.5)
WBC: 4.7 10*3/uL (ref 4.0–10.5)
nRBC: 0 % (ref 0.0–0.2)

## 2019-04-17 LAB — BASIC METABOLIC PANEL
Anion gap: 10 (ref 5–15)
BUN: 9 mg/dL (ref 6–20)
CO2: 22 mmol/L (ref 22–32)
Calcium: 8.9 mg/dL (ref 8.9–10.3)
Chloride: 102 mmol/L (ref 98–111)
Creatinine, Ser: 0.72 mg/dL (ref 0.44–1.00)
GFR calc Af Amer: >60 mL/min (ref >60)
GFR calc non Af Amer: >60 mL/min (ref >60)
Glucose, Bld: 103 mg/dL — ABNORMAL HIGH (ref 70–99)
Potassium: 3.7 mmol/L (ref 3.5–5.1)
Sodium: 134 mmol/L — ABNORMAL LOW (ref 135–145)

## 2019-04-17 LAB — TROPONIN I (HIGH SENSITIVITY)
Troponin I (High Sensitivity): 3 ng/L (ref <18)
Troponin I (High Sensitivity): <2 ng/L (ref <18)

## 2019-04-17 MED ORDER — CLOPIDOGREL BISULFATE 75 MG PO TABS: 75.0000 mg | ORAL_TABLET | Freq: Every day | ORAL | 0 refills | Status: DC

## 2019-04-17 MED ORDER — HYDROXYZINE HCL 25 MG PO TABS: 25.0000 mg | ORAL_TABLET | Freq: Four times a day (QID) | ORAL | 0 refills | Status: DC

## 2019-04-17 MED ORDER — LISINOPRIL 2.5 MG PO TABS: 2.5000 mg | ORAL_TABLET | Freq: Every day | ORAL | 0 refills | Status: DC

## 2019-04-17 MED ORDER — SODIUM CHLORIDE 0.9% FLUSH: 3.0000 mL | Freq: Once | INTRAVENOUS | Status: DC

## 2019-04-17 MED ORDER — METOPROLOL SUCCINATE 25 MG PO CS24: 25.0000 mg | EXTENDED_RELEASE_CAPSULE | Freq: Every day | ORAL | 0 refills | Status: DC

## 2019-04-17 MED FILL — LISINOPRIL 2.5 MG TABLET: 2.5 | 30 days supply | Qty: 30 | Fill #0

## 2019-04-17 MED FILL — METOPROLOL SUCCINATE ER 25: 25 | 30 days supply | Qty: 30 | Fill #0

## 2019-04-17 MED FILL — CLOPIDOGREL 75 MG TABLET: 75 | 30 days supply | Qty: 30 | Fill #0

## 2019-04-17 MED FILL — hydrOXYzine HCL 25 MG TABS: 25 | 4 days supply | Qty: 15 | Fill #0

## 2019-04-17 NOTE — ED Notes (Signed)
Patient verbalizes understanding of discharge instructions. Opportunity for questioning and answers were provided. Armband removed by staff, pt discharged from ED.  

## 2019-04-17 NOTE — Care Management (Signed)
ED CM consulted by EDP Petrucelli for medication assistance. EDCM reviewed chart and spoke with the pt about [facility] MATCH program ($3 co pay for each Rx through [facility] program, does not include refills, 7 day expiration of [place] letter and choice of pharmacies). Pt is eligible for [facility] MATCH program (unable to find pt listed in PROCARE per cardholder name inquiry) and has agreed to accept [facility] under terms discussed. PROCARE information entered. [place] letter completed and provided to pt.  EDCM updated EDP and ED RN.   Hezikiah Retzloff J. Clydene Laming, RN, BSN, [place] [phone]  [facility] set up appointment with [facility] on 05/17/2019 @ 3:30.  Spoke with pt at bedside and advised to please arrive 15 min early and take a picture ID and your current medications.  Pt verbalizes understanding of keeping appointment.

## 2019-04-17 NOTE — Discharge Instructions (Signed)
You were seen in the emergency department today for chest pain. Your work-up in the emergency department has been overall reassuring. Your labs have been fairly normal and or similar to previous blood work you have had done. Your EKG and the enzyme we use to check your heart did not show an acute heart attack at this time. Your chest x-ray was normal.   We have filled a 1 month supply of your prior prescriptions in the emergency department.  We are also sending you home with Atarax, medicine to take every 6 hours as needed for anxiety/stress.  We have prescribed you new medication(s) today. Discuss the medications prescribed today with your pharmacist as they can have adverse effects and interactions with your other medicines including over the counter and prescribed medications. Seek medical evaluation if you start to experience new or abnormal symptoms after taking one of these medicines, seek care immediately if you start to experience difficulty breathing, feeling of your throat closing, facial swelling, or rash as these could be indications of a more serious allergic reaction   We would like you to follow up closely with the cardiologist provided in your discharge instructions within 1-3 days.  We would also like you to follow-up with the primary care appointment we have established for you within the next 1 month.  Return to the ER immediately should you experience any new or worsening symptoms including but not limited to return of pain, worsened pain, vomiting, shortness of breath, dizziness, lightheadedness, passing out, or any other concerns that you may have.

## 2019-04-17 NOTE — ED Provider Notes (Signed)
[place] EMERGENCY DEPARTMENT Provider Note   CSN: [id number] Arrival date & time: 04/17/19  1025     History   Chief Complaint Chief Complaint  Patient presents with  . Chest Pain    HPI Kristina Krause is a 48 y.o. female with a history of CAD s/p PCI in 2014 in [place] & DVT during pregnancy > 20 years ago- no longer on anticoagulation who returns to the ED with complaints of chest discomfort since 20:00 last night. Patient states it feels like a pressure to her non-radiating central chest associated with anxiety/increased stress. No alleviating/aggravating factors. No change with exertion, deep breath, or supine. No intervention PTA. Seen in the ED for similar a few days ago but this feels a bit different.  Denies nausea, vomiting, diaphoresis, dyspnea, lightheadedness, dizziness, syncope,leg pain/swelling, hemoptysis, recent surgery/trauma, recent long travel, hormone use, personal hx of cancer, or hx of DVT/PE.  She states she has been quite stressed/anxious recently.  She has not had her medicines for the past 1 month secondary to possible insurance here in [place].  She was unable to fill her prescriptions from her last ER visit secondary to cost.  She states this pain does not feel the same as prior to her stent placement.        HPI  Past Medical History:  Diagnosis Date  . DVT (deep vein thrombosis) in pregnancy   . Myocardial infarct ([place])     There are no active problems to display for this patient.   Past Surgical History:  Procedure Laterality Date  . CORONARY STENT PLACEMENT  05/2015     OB History   No obstetric history on file.      Home Medications    Prior to Admission medications   Medication Sig Start Date End Date Taking? Authorizing Provider  aspirin EC 81 MG tablet Take 81 mg by mouth daily.    [provider]  clopidogrel (PLAVIX) 75 MG tablet Take 1 tablet (75 mg total) by mouth daily. 04/14/19   Kinnie Feil, PA-C  hydrOXYzine (ATARAX/VISTARIL) 25 MG tablet Take 1 tablet (25 mg total) by mouth every 6 (six) hours. 04/14/19   Kinnie Feil, PA-C  ibuprofen (ADVIL,MOTRIN) 600 MG tablet Take 1 tablet (600 mg total) by mouth every 6 (six) hours as needed for moderate pain. 11/10/17   Domenic Moras, PA-C  lisinopril (ZESTRIL) 2.5 MG tablet Take 1 tablet (2.5 mg total) by mouth daily. 04/14/19   Kinnie Feil, PA-C  Metoprolol Succinate 25 MG CS24 Take 25 mg by mouth daily. 04/14/19   Kinnie Feil, PA-C  traMADol (ULTRAM) 50 MG tablet Take 1 tablet (50 mg total) by mouth every 6 (six) hours as needed. Patient taking differently: Take 50 mg by mouth every 6 (six) hours as needed for moderate pain.  11/04/17   Okey Regal, PA-C    Family History No family history on file.  Social History Social History   Tobacco Use  . Smoking status: Never Smoker  . Smokeless tobacco: Never Used  Substance Use Topics  . Alcohol use: Yes    Comment: occ  . Drug use: Not on file     Allergies   Aspirin and Ibuprofen   Review of Systems Review of Systems  Constitutional: Negative for chills and fever.  HENT: Negative for congestion, ear pain and sore throat.   Respiratory: Negative for shortness of breath and wheezing.   Cardiovascular: Positive for chest pain.  Negative for leg swelling.  Gastrointestinal: Negative for abdominal pain, diarrhea, nausea and vomiting.  Neurological: Negative for dizziness, syncope, light-headedness and headaches.  Psychiatric/Behavioral: Negative for suicidal ideas. The patient is nervous/anxious.   All other systems reviewed and are negative.    Physical Exam Updated Vital Signs BP 129/84 (BP Location: Left Arm)   Pulse 64   Temp 98.6 F (37 C) (Oral)   Resp 18   LMP 04/07/2019   SpO2 98%   Physical Exam Vitals signs and nursing note reviewed.  Constitutional:      General: She is not in acute distress.    Appearance: She is well-developed. She  is not toxic-appearing.  HENT:     Head: Normocephalic and atraumatic.  Eyes:     General:        Right eye: No discharge.        Left eye: No discharge.     Conjunctiva/sclera: Conjunctivae normal.  Neck:     Musculoskeletal: Neck supple.  Cardiovascular:     Rate and Rhythm: Normal rate and regular rhythm.     Pulses:          Radial pulses are 2+ on the right side and 2+ on the left side.  Pulmonary:     Effort: Pulmonary effort is normal. No respiratory distress.     Breath sounds: Normal breath sounds. No wheezing, rhonchi or rales.  Chest:     Chest wall: No tenderness.  Abdominal:     General: There is no distension.     Palpations: Abdomen is soft.     Tenderness: There is no abdominal tenderness.  Musculoskeletal:     Right lower leg: She exhibits no tenderness. No edema.     Left lower leg: She exhibits no tenderness. No edema.  Skin:    General: Skin is warm and dry.     Findings: No rash.  Neurological:     Mental Status: She is alert.     Comments: Clear speech.   Psychiatric:        Behavior: Behavior normal.      ED Treatments / Results  Labs (all labs ordered are listed, but only abnormal results are displayed) Labs Reviewed  BASIC METABOLIC PANEL - Abnormal; Notable for the following components:      Result Value   Sodium 134 (*)    Glucose, Bld 103 (*)    All other components within normal limits  CBC  I-STAT BETA HCG BLOOD, ED ([facility], [facility], [facility] ONLY)  TROPONIN I (HIGH SENSITIVITY)  TROPONIN I (HIGH SENSITIVITY)    EKG EKG Interpretation  Date/Time:  Monday April 17 2019 10:35:58 EST Ventricular Rate:  67 PR Interval:  124 QRS Duration: 76 QT Interval:  390 QTC Calculation: 412 R Axis:   7 Text Interpretation: Normal sinus rhythm Septal infarct , age undetermined Abnormal ECG No significant change since last tracing Confirmed by Melene PlanFloyd, Dan [phone]([id number]) on 04/17/2019 11:42:00 AM   Radiology No results found.  Procedures Procedures  (including critical care time)  Medications Ordered in ED Medications  sodium chloride flush (NS) 0.9 % injection 3 mL (has no administration in time range)     Initial Impression / Assessment and Plan / ED Course  I have reviewed the triage vital signs and the nursing notes.  Pertinent labs & imaging results that were available during my care of the patient were reviewed by me and considered in my medical decision making (see chart for details).  Patient presents to the emergency department with chest discomfort & anxiety. Patient nontoxic appearing, in no apparent distress, vitals without significant abnormality. Fairly benign physical exam.  Work-up in the ER reviewed:  CBC: No anemia/leukocytosis.  BMP: No significant electrolyte derangement or renal dysfunction.  Troponin: 3, <2 EKG: NO significant change from prior CXR:  Negative, without infiltrate, effusion, pneumothorax, or fracture/dislocation.   Low risk [place], not hypoxic or tachycardic, doubt PE.  No widened mediastinum on imaging, symmetric pulses, doubt dissection.  Chest x-ray without pneumothorax.  Labs without critical anemia.  Patient does have hx of CAD however feel her chest pain is very atypical, chest pain > 12 hours, troponin downtrending and very low with initial 3, repeat < 2, EKG without significant change from prior, patient does not want to be admitted for her chest pain & I again do not suspect this is ACS and do not feel admission is absolutely necessary at this time. Feel she would benefit from cardiology follow up however.  She appears appropriate for discharge home.  Case management was consulted and assisted with delivery of patient's medicines for a 1 month supply to the emergency department.  They have also assisted with setting up primary care follow-up appointment.  Appreciate assistance.  I discussed results, treatment plan, need for follow-up, and return precautions with the patient. Provided  opportunity for questions, patient confirmed understanding and is in agreement with plan.   Findings and plan of care discussed with supervising physician Dr. Adela Lank who is in agreement.   Final Clinical Impressions(s) / ED Diagnoses   Final diagnoses:  Chest pain, unspecified type    ED Discharge Orders         Ordered    clopidogrel (PLAVIX) 75 MG tablet  Daily     04/17/19 1449    hydrOXYzine (ATARAX/VISTARIL) 25 MG tablet  Every 6 hours     04/17/19 1449    Metoprolol Succinate 25 MG CS24  Daily     04/17/19 1449    lisinopril (ZESTRIL) 2.5 MG tablet  Daily     04/17/19 1449           Petrucelli, [place] R, PA-C 04/17/19 1512    Melene Plan, DO 04/17/19 1518

## 2019-04-17 NOTE — ED Triage Notes (Signed)
Pt back today with c/o chest pain center of her chest , pt was seen 2 days and was told it was anxiety

## 2019-05-17 ENCOUNTER — Ambulatory Visit (INDEPENDENT_AMBULATORY_CARE_PROVIDER_SITE_OTHER): Payer: Self-pay | Admitting: Primary Care

## 2019-05-17 ENCOUNTER — Other Ambulatory Visit: Payer: Self-pay

## 2019-05-17 ENCOUNTER — Encounter (INDEPENDENT_AMBULATORY_CARE_PROVIDER_SITE_OTHER): Payer: Self-pay | Admitting: Primary Care

## 2019-05-17 VITALS — BP 132/84 | HR 84 | Temp 97.3°F | Ht 65.0 in | Wt 203.2 lb

## 2019-05-17 DIAGNOSIS — F411 Generalized anxiety disorder: Secondary | ICD-10-CM

## 2019-05-17 DIAGNOSIS — Z6833 Body mass index (BMI) 33.0-33.9, adult: Secondary | ICD-10-CM

## 2019-05-17 DIAGNOSIS — E6609 Other obesity due to excess calories: Secondary | ICD-10-CM

## 2019-05-17 DIAGNOSIS — Z13220 Encounter for screening for lipoid disorders: Secondary | ICD-10-CM

## 2019-05-17 DIAGNOSIS — N912 Amenorrhea, unspecified: Secondary | ICD-10-CM

## 2019-05-17 DIAGNOSIS — Z7689 Persons encountering health services in other specified circumstances: Secondary | ICD-10-CM

## 2019-05-17 MED ORDER — CLOPIDOGREL BISULFATE 75 MG PO TABS: 75.0000 mg | ORAL_TABLET | Freq: Every day | ORAL | 5 refills | Status: DC

## 2019-05-17 MED ORDER — LISINOPRIL 2.5 MG PO TABS: 2.5000 mg | ORAL_TABLET | Freq: Every day | ORAL | 5 refills | Status: DC

## 2019-05-17 MED ORDER — METOPROLOL SUCCINATE 25 MG PO CS24: 25.0000 mg | EXTENDED_RELEASE_CAPSULE | Freq: Every day | ORAL | 5 refills | Status: DC

## 2019-05-17 NOTE — Patient Instructions (Signed)

## 2019-05-17 NOTE — Progress Notes (Signed)
New Patient Office Visit  Subjective:  Patient ID: Kristina Krause, female    DOB: [birth year]  Age: 48 y.o. MRN: [id number]  CC:  Chief Complaint  Patient presents with  . New Patient (Initial Visit)    chest pain    HPI Kristina Krause presents for today to establish care. She has had several "panic attacks" in which her heart races and pass out. Bp slightly elevated reviewed nots previously Bp wnl.   Past Medical History:  Diagnosis Date  . DVT (deep vein thrombosis) in pregnancy   . Myocardial infarct [facility])     Past Surgical History:  Procedure Laterality Date  . CORONARY STENT PLACEMENT  05/2015    History reviewed. No pertinent family history.  Social History   Socioeconomic History  . Marital status: Single    Spouse name: Not on file  . Number of children: Not on file  . Years of education: Not on file  . Highest education level: Not on file  Occupational History  . Not on file  Tobacco Use  . Smoking status: Never Smoker  . Smokeless tobacco: Never Used  Substance and Sexual Activity  . Alcohol use: Yes    Comment: occ  . Drug use: Not on file  . Sexual activity: Not on file  Other Topics Concern  . Not on file  Social History Narrative  . Not on file   Social Determinants of Health   Financial Resource Strain:   . Difficulty of Paying Living Expenses: Not on file  Food Insecurity:   . Worried About Charity fundraiser in the Last Year: Not on file  . Ran Out of Food in the Last Year: Not on file  Transportation Needs:   . Lack of Transportation (Medical): Not on file  . Lack of Transportation (Non-Medical): Not on file  Physical Activity:   . Days of Exercise per Week: Not on file  . Minutes of Exercise per Session: Not on file  Stress:   . Feeling of Stress : Not on file  Social Connections:   . Frequency of Communication with Friends and Family: Not on file  . Frequency of Social Gatherings with Friends and Family: Not on file  . Attends  Religious Services: Not on file  . Active Member of Clubs or Organizations: Not on file  . Attends Archivist Meetings: Not on file  . Marital Status: Not on file  Intimate Partner Violence:   . Fear of Current or Ex-Partner: Not on file  . Emotionally Abused: Not on file  . Physically Abused: Not on file  . Sexually Abused: Not on file    ROS Review of Systems  Cardiovascular: Positive for palpitations.  All other systems reviewed and are negative.   Objective:   Today's Vitals: BP 132/84 (BP Location: Left Arm, Patient Position: Sitting, Cuff Size: Normal)   Pulse 84   Temp (!) 97.3 F (36.3 C) (Temporal)   Ht 5\' 5"  (1.651 m)   Wt 203 lb 3.2 oz (92.2 kg)   LMP 03/14/2019   SpO2 96%   BMI 33.81 kg/m   Physical Exam Vitals signs reviewed.  Constitutional:      Appearance: Normal appearance. She is obese.  HENT:     Head: Normocephalic and atraumatic.     Right Ear: Tympanic membrane normal.     Left Ear: Tympanic membrane normal.     Nose: Nose normal.  Eyes:     Extraocular Movements: Extraocular  movements intact.     Pupils: Pupils are equal, round, and reactive to light.  Neck:     Musculoskeletal: Normal range of motion and neck supple.  Cardiovascular:     Rate and Rhythm: Normal rate and regular rhythm.     Pulses: Normal pulses.     Heart sounds: Normal heart sounds.  Pulmonary:     Effort: Pulmonary effort is normal.     Breath sounds: Normal breath sounds.  Abdominal:     General: Bowel sounds are normal.     Palpations: Abdomen is soft.  Musculoskeletal: Normal range of motion.  Skin:    General: Skin is warm and dry.  Neurological:     Mental Status: She is alert and oriented to person, place, and time.  Psychiatric:        Mood and Affect: Mood normal.        Thought Content: Thought content normal.     Assessment & Plan:  Tally was seen today for new patient (initial visit).  Diagnoses and all orders for this  visit:  Encounter to establish care Gwinda Passe, NP-C will be your  (PCP) mastered prepared that is able to that will  diagnosed and treatment able to answer health concern as well as continuing care of varied medical conditions, not limited by cause, organ system, or diagnosis.   Generalized anxiety disorder  Class 1 obesity due to excess calories without serious comorbidity with body mass index (BMI) of 33.0 to 33.9 in adult Obesity is 30-39 indicating an excess in caloric intake or underlining conditions. This may lead to other co-morbidities .ie CVD, DM. Lifestyle modifications of diet and exercise may reduce obesity.   Screening, lipid -     Lipid panel; Future  Amenorrhea She is having  absence of menstrual cycle for at least 3 cycles if previously normal menstruation or 6 months with irregular cycles. Rule out vaginal dysfunction.  Other orders -     lisinopril (ZESTRIL) 2.5 MG tablet; Take 1 tablet (2.5 mg total) by mouth daily. -     Metoprolol Succinate 25 MG CS24; Take 25 mg by mouth daily. -     clopidogrel (PLAVIX) 75 MG tablet; Take 1 tablet (75 mg total) by mouth daily.    Outpatient Encounter Medications as of 05/17/2019  Medication Sig  . clopidogrel (PLAVIX) 75 MG tablet Take 1 tablet (75 mg total) by mouth daily.  Marland Kitchen lisinopril (ZESTRIL) 2.5 MG tablet Take 1 tablet (2.5 mg total) by mouth daily.  . Metoprolol Succinate 25 MG CS24 Take 25 mg by mouth daily.  . [DISCONTINUED] clopidogrel (PLAVIX) 75 MG tablet Take 1 tablet (75 mg total) by mouth daily.  . [DISCONTINUED] lisinopril (ZESTRIL) 2.5 MG tablet Take 1 tablet (2.5 mg total) by mouth daily.  . [DISCONTINUED] Metoprolol Succinate 25 MG CS24 Take 25 mg by mouth daily.  Marland Kitchen aspirin EC 81 MG tablet Take 81 mg by mouth daily.  . hydrOXYzine (ATARAX/VISTARIL) 25 MG tablet Take 1 tablet (25 mg total) by mouth every 6 (six) hours. (Patient not taking: Reported on 04/17/2019)  . [DISCONTINUED] ibuprofen (ADVIL,MOTRIN)  600 MG tablet Take 1 tablet (600 mg total) by mouth every 6 (six) hours as needed for moderate pain. (Patient not taking: Reported on 04/17/2019)  . [DISCONTINUED] traMADol (ULTRAM) 50 MG tablet Take 1 tablet (50 mg total) by mouth every 6 (six) hours as needed. (Patient not taking: Reported on 04/17/2019)   No facility-administered encounter medications on file as of  05/17/2019.    Follow-up: Return for schedule pap.   Grayce SessionsMichelle P Edwards, NP

## 2019-05-25 ENCOUNTER — Encounter (INDEPENDENT_AMBULATORY_CARE_PROVIDER_SITE_OTHER): Payer: Self-pay | Admitting: Primary Care

## 2019-05-25 ENCOUNTER — Other Ambulatory Visit: Payer: Self-pay

## 2019-05-25 ENCOUNTER — Ambulatory Visit (INDEPENDENT_AMBULATORY_CARE_PROVIDER_SITE_OTHER): Payer: Self-pay | Admitting: Primary Care

## 2019-05-25 ENCOUNTER — Other Ambulatory Visit (HOSPITAL_COMMUNITY)
Admission: RE | Admit: 2019-05-25 | Discharge: 2019-05-25 | Disposition: A | Discharge status: Home / Self Care | Payer: Medicaid Other | Source: Ambulatory Visit | Attending: Primary Care | Admitting: Primary Care

## 2019-05-25 VITALS — BP 119/69 | HR 58 | Temp 97.3°F | Ht 65.0 in | Wt 198.0 lb

## 2019-05-25 DIAGNOSIS — Z124 Encounter for screening for malignant neoplasm of cervix: Secondary | ICD-10-CM

## 2019-05-25 DIAGNOSIS — N951 Menopausal and female climacteric states: Secondary | ICD-10-CM

## 2019-05-25 DIAGNOSIS — Z131 Encounter for screening for diabetes mellitus: Secondary | ICD-10-CM

## 2019-05-25 DIAGNOSIS — Z1231 Encounter for screening mammogram for malignant neoplasm of breast: Secondary | ICD-10-CM

## 2019-05-25 DIAGNOSIS — Z113 Encounter for screening for infections with a predominantly sexual mode of transmission: Secondary | ICD-10-CM

## 2019-05-25 DIAGNOSIS — Z114 Encounter for screening for human immunodeficiency virus [HIV]: Secondary | ICD-10-CM

## 2019-05-25 NOTE — Progress Notes (Signed)
Established Patient Office Visit  Subjective:  Patient ID: Kristina Krause, female    DOB: 04-Feb-1971  Age: 48 y.o. MRN: [id number]  CC:  Chief Complaint  Patient presents with  . Gynecologic Exam    HPI Kristina Krause presents for well woman gyn exam.   Past Medical History:  Diagnosis Date  . DVT (deep vein thrombosis) in pregnancy   . Myocardial infarct [facility])     Past Surgical History:  Procedure Laterality Date  . CORONARY STENT PLACEMENT  05/2015    History reviewed. No pertinent family history.  Social History   Socioeconomic History  . Marital status: Single    Spouse name: Not on file  . Number of children: Not on file  . Years of education: Not on file  . Highest education level: Not on file  Occupational History  . Not on file  Tobacco Use  . Smoking status: Never Smoker  . Smokeless tobacco: Never Used  Substance and Sexual Activity  . Alcohol use: Yes    Comment: occ  . Drug use: Not on file  . Sexual activity: Not on file  Other Topics Concern  . Not on file  Social History Narrative  . Not on file   Social Determinants of Health   Financial Resource Strain:   . Difficulty of Paying Living Expenses: Not on file  Food Insecurity:   . Worried About Charity fundraiser in the Last Year: Not on file  . Ran Out of Food in the Last Year: Not on file  Transportation Needs:   . Lack of Transportation (Medical): Not on file  . Lack of Transportation (Non-Medical): Not on file  Physical Activity:   . Days of Exercise per Week: Not on file  . Minutes of Exercise per Session: Not on file  Stress:   . Feeling of Stress : Not on file  Social Connections:   . Frequency of Communication with Friends and Family: Not on file  . Frequency of Social Gatherings with Friends and Family: Not on file  . Attends Religious Services: Not on file  . Active Member of Clubs or Organizations: Not on file  . Attends Archivist Meetings: Not on file  .  Marital Status: Not on file  Intimate Partner Violence:   . Fear of Current or Ex-Partner: Not on file  . Emotionally Abused: Not on file  . Physically Abused: Not on file  . Sexually Abused: Not on file    Outpatient Medications Prior to Visit  Medication Sig Dispense Refill  . aspirin EC 81 MG tablet Take 81 mg by mouth daily.    . clopidogrel (PLAVIX) 75 MG tablet Take 1 tablet (75 mg total) by mouth daily. 30 tablet 5  . lisinopril (ZESTRIL) 2.5 MG tablet Take 1 tablet (2.5 mg total) by mouth daily. 30 tablet 5  . Metoprolol Succinate 25 MG CS24 Take 25 mg by mouth daily. 30 capsule 5  . hydrOXYzine (ATARAX/VISTARIL) 25 MG tablet Take 1 tablet (25 mg total) by mouth every 6 (six) hours. (Patient not taking: Reported on 04/17/2019) 15 tablet 0   No facility-administered medications prior to visit.    Allergies  Allergen Reactions  . Aspirin Nausea Only    Pt reports anything above 81 mg causes stomach aches    . Ibuprofen     cramping    ROS Review of Systems  All other systems reviewed and are negative.     Objective:  Physical Exam CONSTITUTIONAL: Well-developed, well-nourished obese female in no acute distress.  HENT:  Normocephalic, atraumatic, External right and left ear normal. Oropharynx is clear and moist EYES: Conjunctivae and EOM are normal. Pupils are equal, round, and reactive to light. No scleral icterus.  NECK: Normal range of motion, supple, no masses.  Normal thyroid.  SKIN: Skin is warm and dry. No rash noted. Not diaphoretic. No erythema. No pallor. NEUROLGIC: Alert and oriented to person, place, and time. Normal reflexes, muscle tone coordination. No cranial nerve deficit noted. PSYCHIATRIC: Normal mood and affect. Normal behavior. Normal judgment and thought content. CARDIOVASCULAR: Normal heart rate noted, regular rhythm RESPIRATORY: Clear to auscultation bilaterally. Effort and breath sounds normal, no problems with respiration noted. BREASTS:  refer for mammogram and taught SBE ABDOMEN: Soft, normal bowel sounds, no distention noted.  No tenderness, rebound or guarding.  PELVIC: Normal appearing external genitalia; normal appearing vaginal mucosa and cervix.  No abnormal discharge noted.  Pap smear obtained.  Normal uterine size, no other palpable masses, no uterine or adnexal tenderness. MUSCULOSKELETAL: Normal range of motion. No tenderness.  No cyanosis, clubbing, or edema.  BP 119/69 (BP Location: Right Arm, Patient Position: Sitting, Cuff Size: Large)   Pulse (!) 58   Temp (!) 97.3 F (36.3 C) (Temporal)   Ht 5\' 5"  (1.651 m)   Wt 198 lb (89.8 kg)   SpO2 99%   BMI 32.95 kg/m  Wt Readings from Last 3 Encounters:  05/25/19 198 lb (89.8 kg)  05/17/19 203 lb 3.2 oz (92.2 kg)  04/14/19 186 lb (84.4 kg)     There are no preventive care reminders to display for this patient.  There are no preventive care reminders to display for this patient.  No results found for: TSH Lab Results  Component Value Date   WBC 4.7 04/17/2019   HGB 12.7 04/17/2019   HCT 37.9 04/17/2019   MCV 92.0 04/17/2019   PLT 352 04/17/2019   Lab Results  Component Value Date   NA 134 (L) 04/17/2019   K 3.7 04/17/2019   CO2 22 04/17/2019   GLUCOSE 103 (H) 04/17/2019   BUN 9 04/17/2019   CREATININE 0.72 04/17/2019   CALCIUM 8.9 04/17/2019   ANIONGAP 10 04/17/2019   No results found for: CHOL No results found for: HDL No results found for: LDLCALC No results found for: TRIG No results found for: CHOLHDL No results found for: 13/02/2019    Assessment & Plan:   Problem List Items Addressed This Visit    None    Visit Diagnoses    Cervical cancer screening    -  Primary   Relevant Orders   Cytology - PAP([place])   Screening examination for sexually transmitted disease       Relevant Orders   Cervicovaginal ancillary only   HIV antibody (with reflex)   Encounter for screening for HIV       Encounter for screening mammogram for  malignant neoplasm of breast       Relevant Orders   MM Digital Screening      No orders of the defined types were placed in this encounter.   Follow-up: Return if symptoms worsen or fail to improve.    [id number], NP

## 2019-05-25 NOTE — Patient Instructions (Signed)
The USPSTF recommendations screening of cervical cancer every 3 years with cervical cytology.  All women age 49 to 65 years are at risk for cervical cancer because of potential exposure to high risk HPV types to sexual intercourse and should be screened.  Certainly risk factors further increased risk for cervical cancer including HIV infection, a compromised immune system, and utero exposure to diethylstilbestrol and previous treatment of high-grade precancerous lesions or cervical cancer.  Women with these risk factors should receive individual follow-up 

## 2019-05-25 NOTE — Progress Notes (Signed)
5'5"  97.3 198  

## 2019-05-26 LAB — FSH/LH
FSH: 17.1 m[IU]/mL
LH: 12.3 m[IU]/mL

## 2019-05-26 LAB — CERVICOVAGINAL ANCILLARY ONLY
Bacterial Vaginitis (gardnerella): NEGATIVE
Candida Glabrata: NEGATIVE
Candida Vaginitis: NEGATIVE
Chlamydia: NEGATIVE
Comment: NEGATIVE
Comment: NEGATIVE
Comment: NEGATIVE
Comment: NEGATIVE
Comment: NEGATIVE
Comment: NORMAL
Neisseria Gonorrhea: NEGATIVE
Trichomonas: NEGATIVE

## 2019-05-26 LAB — HIV ANTIBODY (ROUTINE TESTING W REFLEX): HIV Screen 4th Generation wRfx: NONREACTIVE

## 2019-05-26 LAB — PROGESTERONE: Progesterone: 9.6 ng/mL

## 2019-05-26 LAB — CYTOLOGY - PAP
Adequacy: ABSENT
Diagnosis: NEGATIVE

## 2019-06-07 ENCOUNTER — Encounter: Payer: Self-pay | Admitting: Primary Care

## 2019-06-20 ENCOUNTER — Telehealth: Payer: Self-pay

## 2019-06-20 NOTE — Telephone Encounter (Signed)
Called pt to changed scheduled appointment to virtual. Left message to call back.

## 2019-06-21 NOTE — Telephone Encounter (Signed)
Pt called and changed appointment to virtual.

## 2019-06-21 NOTE — Telephone Encounter (Signed)
Attempted to contact pt x 2 to change appointment to virtual. Left message to call back.  

## 2019-06-22 ENCOUNTER — Encounter: Payer: Self-pay | Admitting: Cardiology

## 2019-06-22 ENCOUNTER — Telehealth (INDEPENDENT_AMBULATORY_CARE_PROVIDER_SITE_OTHER): Payer: Self-pay | Admitting: Cardiology

## 2019-06-22 DIAGNOSIS — Z7189 Other specified counseling: Secondary | ICD-10-CM

## 2019-06-22 DIAGNOSIS — Z8249 Family history of ischemic heart disease and other diseases of the circulatory system: Secondary | ICD-10-CM

## 2019-06-22 DIAGNOSIS — R079 Chest pain, unspecified: Secondary | ICD-10-CM

## 2019-06-22 DIAGNOSIS — I2510 Atherosclerotic heart disease of native coronary artery without angina pectoris: Secondary | ICD-10-CM

## 2019-06-22 NOTE — Progress Notes (Signed)
Virtual Visit via Video Note   This visit type was conducted due to national recommendations for restrictions regarding the COVID-19 Pandemic (e.g. social distancing) in an effort to limit this patient's exposure and mitigate transmission in our community.  Due to her co-morbid illnesses, this patient is at least at moderate risk for complications without adequate follow up.  This format is felt to be most appropriate for this patient at this time.  All issues noted in this document were discussed and addressed.  A limited physical exam was performed with this format.  Please refer to the patient's chart for her consent to telehealth for [facility].   Date:  06/22/2019   ID:  Kristina Krause, DOB 1970/07/29, MRN [id number]  Patient Location: Home Provider Location: Home  PCP:  [name], NP  Cardiologist:  Buford Dresser, MD  Electrophysiologist:  None   Evaluation Performed:  Consultation - Kristina Krause was referred by Juluis Mire, NP for the evaluation of chest pain.  Chief Complaint:  Chest pain  History of Present Illness:    Kristina Krause is a 49 y.o. female with CAD s/p PCI 2016 (in [place]--no records), DVT during pregnancy in remote past, panic attacks who is seen as a new consult at the request of Juluis Mire for evaluation of chest pain.  The patient does not have symptoms concerning for COVID-19 infection (fever, chills, cough, or new shortness of breath).   Starting in October 2020, she began to get more symptoms. She was driving, immediately hit with a sense of fear, worried that she was having another heart attack. It affected her life, had to quit her job, had to stop driving because of the frequency of this. She went to the ER for this, was given a medication for anxiety but this made her symptoms worse. Trying to work on breathing techniques, management strategies to help symptoms naturally.  She is a Psychologist, sport and exercise, works in the hospital, hears a  lot of scary stories that has her worried. Has been back in [place] for about 2.5 years.  Symptoms before 2016 MI: Prior to MI, was in a very stressful time, had moved back home with her mom. No anxiety/palpitation sensations. Noticed that she was short of breath climbing stairs, chest was heavy with exertion. Then had an episode which she thought was indigestion, progressively more pain in the chest, didn't respond to medications, called EMS, found to have heart attack. [facility] to [facility] (now [facility]  Symptoms -Quality/associated symptoms: feels butterflies in her stomach, then feels numb in her chest, feels short of breath, feels heart beating hard/fast in her chest, feels like she might pass out.  -Frequency: about once/week but difficult to predict -Duration: lasts 30 minutes, waxing/waning -Aggravating/alleviating factors: worse with wine, caffeine.  -Prior cardiac history: MI in 2016 with stent in [place] -Prior ECG: 05/15/2019 NSR -Prior workup: in ER, normal troponins, normal ECG -Alcohol: tries to drink red wine for heart health -Tobacco: former smoker briefly in her early 67s, smoked for 2 years, quit after her heart attack -Comorbidities: never been told she has regular diabetes, was told she had gestational diabetes.  -Exercise level: trying to increase, rare intentional walking/exercise -Family history:  Mother and father each had MI later in life. Father died age 88, mother is living in her 3s  Is not on a statin. Started briefly and then stopped per personal preference. We discussed guidelines. She is willing to think about it.   Past Medical History:  Diagnosis Date  . DVT (deep vein thrombosis) in pregnancy   . Myocardial infarct [facility])    Past Surgical History:  Procedure Laterality Date  . CORONARY STENT PLACEMENT  05/2015     Current Meds  Medication Sig  . aspirin EC 81 MG tablet Take 81 mg by mouth daily.  . clopidogrel (PLAVIX) 75 MG tablet Take 1 tablet (75  mg total) by mouth daily.  Marland Kitchen lisinopril (ZESTRIL) 2.5 MG tablet Take 1 tablet (2.5 mg total) by mouth daily.  . Metoprolol Succinate 25 MG CS24 Take 25 mg by mouth daily.     Allergies:   Aspirin and Ibuprofen   Social History   Tobacco Use  . Smoking status: Former Smoker    Types: Cigarettes  . Smokeless tobacco: Never Used  Substance Use Topics  . Alcohol use: Yes    Comment: occ  . Drug use: Not on file     Family Hx: Mother and father each had MI later in life. Father died age 54, mother is living in her 1s  ROS:   Please see the history of present illness.    All other systems reviewed and are negative.   Prior CV studies:   The following studies were reviewed today: No records available, queried Care Everywhere  Labs/Other Tests and Data Reviewed:    EKG:  An ECG dated 04/17/19 was personally reviewed today and demonstrated:  NSR  Recent Labs: 04/17/2019: BUN 9; Creatinine, Ser 0.72; Hemoglobin 12.7; Platelets 352; Potassium 3.7; Sodium 134   Recent Lipid Panel No results found for: CHOL, TRIG, HDL, CHOLHDL, LDLCALC, LDLDIRECT  Wt Readings from Last 3 Encounters:  05/25/19 198 lb (89.8 kg)  05/17/19 203 lb 3.2 oz (92.2 kg)  04/14/19 186 lb (84.4 kg)     Objective:    Vital Signs:  There were no vitals taken for this visit.   VITAL SIGNS:  reviewed GEN:  no acute distress EYES:  sclerae anicteric, EOMI - Extraocular Movements Intact RESPIRATORY:  normal respiratory effort, symmetric expansion CARDIOVASCULAR:  no visible JVD SKIN:  no rash, lesions or ulcers. MUSCULOSKELETAL:  no obvious deformities. NEURO:  alert and oriented x 3, no obvious focal deficit PSYCH:  normal affect  ASSESSMENT & PLAN:    1. Chest pain, unspecified type   2. Coronary artery disease involving native coronary artery of native heart, angina presence unspecified   3. Cardiac risk counseling   4. Counseling on health promotion and disease prevention   5. Family history of  heart disease    Chest pain, unclear etiology, with a history of known CAD s/p MI and PCI in 2016 (no records available) -symptoms not like her prior, atypical, but she is higher risk with known ASCVD history -discussed treadmill stress, nuclear stress/lexiscan. Discussed pros and cons of each, including but not limited to false positive/false negative risk, radiation risk, and risk of IV contrast dye. Based on shared decision making, decision was made to pursue lexiscan nuclear stress test -counseled that if this is normal, she should consider non-cardiac sources of symptoms, but need to rule out the heart first  CV risk and secondary prevention: also has family history (though not premature) of CAD -recommend heart healthy/Mediterranean diet, with whole grains, fruits, vegetable, fish, lean meats, nuts, and olive oil. Limit salt. -recommend moderate walking, 3-5 times/week for 30-50 minutes each session. Aim for at least 150 minutes.week. Goal should be pace of 3 miles/hours, or walking 1.5 miles in 30 minutes -recommend avoidance  of tobacco products. Avoid excess alcohol. -reviewed medications. No indication for DAPT, consider stopping clopidogrel after stress test. Also not on statin. We discussed today. She will consider.  COVID-19 Education: The signs and symptoms of COVID-19 were discussed with the patient and how to seek care for testing (follow up with PCP or arrange E-visit).  The importance of social distancing was discussed today.  Time:   Today, I have spent 33 minutes via videa with the patient with telehealth technology discussing the above problems.    Patient Instructions  Medication Instructions:  Your Physician recommend you continue on your current medication as directed.    *If you need a refill on your cardiac medications before your next appointment, please call your pharmacy*  Lab Work: Your physician recommends that you return for lab work ( Fasting  Lipid)   Testing/Procedures: Your physician has requested that you have a lexiscan myoview. For further information please visit [email or web address] Please follow instruction sheet, as given. [address]   Follow-Up: At [facility], you and your health needs are our priority.  As part of our continuing mission to provide you with exceptional heart care, we have created designated Provider Care Teams.  These Care Teams include your primary Cardiologist (physician) and Advanced Practice Providers (APPs -  Physician Assistants and Nurse Practitioners) who all work together to provide you with the care you need, when you need it.  Your next appointment:   4 week(s)  The format for your next appointment:   In Person  Provider:   Jodelle Red, MD  You are scheduled for a Myocardial Perfusion Imaging Study.  Please arrive 15 minutes prior to your appointment time for registration and insurance purposes.  The test will take approximately 3 to 4 hours to complete; you may bring reading material.  If someone comes with you to your appointment, they will need to remain in the main lobby due to limited space in the testing area. **If you are pregnant or breastfeeding, please notify the nuclear lab prior to your appointment**  How to prepare for your Myocardial Perfusion Test: . Do not eat or drink 3 hours prior to your test, except you may have water. . Do not consume products containing caffeine (regular or decaffeinated) 12 hours prior to your test. (ex: coffee, chocolate, sodas, tea). . Do bring a list of your current medications with you.  If not listed below, you may take your medications as normal. . Do wear comfortable clothes (no dresses or overalls) and walking shoes, tennis shoes preferred (No heels or open toe shoes are allowed). . Do NOT wear cologne, perfume, aftershave, or lotions (deodorant is allowed). . If these instructions are not followed, your  test will have to be rescheduled.  Please report to [address], [address] for your test.  If you have questions or concerns about your appointment, you can call the Nuclear Lab at [phone].  If you cannot keep your appointment, please provide 24 hours notification to the Nuclear Lab, to avoid a possible $50 charge to your account.     Signed, Jodelle Red, MD  06/22/2019 11:16 AM    [facility]

## 2019-06-22 NOTE — Patient Instructions (Signed)
Medication Instructions:  Your Physician recommend you continue on your current medication as directed.    *If you need a refill on your cardiac medications before your next appointment, please call your pharmacy*  Lab Work: Your physician recommends that you return for lab work ( Fasting Lipid)   Testing/Procedures: Your physician has requested that you have a lexiscan myoview. For further information please visit [email or web address] Please follow instruction sheet, as given. [address]   Follow-Up: At [facility], you and your health needs are our priority.  As part of our continuing mission to provide you with exceptional heart care, we have created designated Provider Care Teams.  These Care Teams include your primary Cardiologist (physician) and Advanced Practice Providers (APPs -  Physician Assistants and Nurse Practitioners) who all work together to provide you with the care you need, when you need it.  Your next appointment:   4 week(s)  The format for your next appointment:   In Person  Provider:   Jodelle Red, MD  You are scheduled for a Myocardial Perfusion Imaging Study.  Please arrive 15 minutes prior to your appointment time for registration and insurance purposes.  The test will take approximately 3 to 4 hours to complete; you may bring reading material.  If someone comes with you to your appointment, they will need to remain in the main lobby due to limited space in the testing area. **If you are pregnant or breastfeeding, please notify the nuclear lab prior to your appointment**  How to prepare for your Myocardial Perfusion Test: . Do not eat or drink 3 hours prior to your test, except you may have water. . Do not consume products containing caffeine (regular or decaffeinated) 12 hours prior to your test. (ex: coffee, chocolate, sodas, tea). . Do bring a list of your current medications with you.  If not listed below, you may take  your medications as normal. . Do wear comfortable clothes (no dresses or overalls) and walking shoes, tennis shoes preferred (No heels or open toe shoes are allowed). . Do NOT wear cologne, perfume, aftershave, or lotions (deodorant is allowed). . If these instructions are not followed, your test will have to be rescheduled.  Please report to [address] for your test.  If you have questions or concerns about your appointment, you can call the Nuclear Lab at [phone].  If you cannot keep your appointment, please provide 24 hours notification to the Nuclear Lab, to avoid a possible $50 charge to your account.

## 2019-06-26 ENCOUNTER — Telehealth (INDEPENDENT_AMBULATORY_CARE_PROVIDER_SITE_OTHER): Payer: Self-pay

## 2019-06-26 ENCOUNTER — Encounter (INDEPENDENT_AMBULATORY_CARE_PROVIDER_SITE_OTHER): Payer: Self-pay | Admitting: Primary Care

## 2019-06-26 ENCOUNTER — Telehealth (HOSPITAL_COMMUNITY): Payer: Self-pay

## 2019-06-26 NOTE — Telephone Encounter (Signed)
CMA was able to see results, but PCP had not addressed them. Please address so that patient can be informed of lab results.

## 2019-06-26 NOTE — Telephone Encounter (Signed)
Patient called in regards to lab results from Dec 17.  Please address.

## 2019-06-26 NOTE — Progress Notes (Signed)
I have called patient to inform her of labs. Labs are all normal.

## 2019-06-26 NOTE — Telephone Encounter (Signed)
Instructions left on the patient's answering machine, Asked to call back with any questions. S.Kyelle Urbas EMTP 

## 2019-06-29 ENCOUNTER — Ambulatory Visit (HOSPITAL_COMMUNITY): Payer: Self-pay | Attending: Cardiology

## 2019-06-29 ENCOUNTER — Other Ambulatory Visit: Payer: Self-pay

## 2019-06-29 VITALS — Ht 65.0 in | Wt 198.0 lb

## 2019-06-29 DIAGNOSIS — I2510 Atherosclerotic heart disease of native coronary artery without angina pectoris: Secondary | ICD-10-CM | POA: Insufficient documentation

## 2019-06-29 DIAGNOSIS — R079 Chest pain, unspecified: Secondary | ICD-10-CM | POA: Insufficient documentation

## 2019-06-29 LAB — MYOCARDIAL PERFUSION IMAGING
LV dias vol: 73 mL (ref 46–106)
LV sys vol: 32 mL
Peak HR: 129 {beats}/min
Rest HR: 71 {beats}/min
SDS: 2
SRS: 4
SSS: 6
TID: 1

## 2019-06-29 MED ORDER — REGADENOSON 0.4 MG/5ML IV SOLN
0.4000 mg | Freq: Once | INTRAVENOUS | Status: AC
  Administered 2019-06-29: 0.4 mg via INTRAVENOUS

## 2019-06-29 MED ORDER — TECHNETIUM TC 99M TETROFOSMIN IV KIT
10.3000 | PACK | Freq: Once | INTRAVENOUS | Status: AC | PRN
  Administered 2019-06-29: 10.3 via INTRAVENOUS
  Filled 2019-06-29: qty 11

## 2019-06-29 MED ORDER — TECHNETIUM TC 99M TETROFOSMIN IV KIT
32.4000 | PACK | Freq: Once | INTRAVENOUS | Status: AC | PRN
  Administered 2019-06-29: 32.4 via INTRAVENOUS
  Filled 2019-06-29: qty 33

## 2019-07-12 ENCOUNTER — Ambulatory Visit: Payer: Medicaid Other

## 2019-07-21 ENCOUNTER — Ambulatory Visit: Payer: Self-pay | Admitting: Cardiology

## 2019-07-27 ENCOUNTER — Telehealth: Payer: Self-pay

## 2019-07-27 NOTE — Telephone Encounter (Signed)
Attempted to contact pt to reschedule appointment due to weather conditions. Left message to call back.

## 2019-07-28 ENCOUNTER — Ambulatory Visit: Payer: Self-pay | Admitting: Cardiology

## 2019-07-28 NOTE — Telephone Encounter (Signed)
Appointment rescheduled for 3/3

## 2019-08-09 ENCOUNTER — Ambulatory Visit: Payer: Self-pay | Admitting: Cardiology

## 2019-08-14 ENCOUNTER — Emergency Department (HOSPITAL_COMMUNITY)
Admission: EM | Admit: 2019-08-14 | Discharge: 2019-08-14 | Disposition: A | Discharge status: Home / Self Care | Payer: Medicaid Other | Attending: Emergency Medicine | Admitting: Emergency Medicine

## 2019-08-14 ENCOUNTER — Emergency Department (HOSPITAL_COMMUNITY): Payer: Medicaid Other

## 2019-08-14 ENCOUNTER — Other Ambulatory Visit: Payer: Self-pay

## 2019-08-14 ENCOUNTER — Encounter (HOSPITAL_COMMUNITY): Payer: Self-pay | Admitting: Emergency Medicine

## 2019-08-14 DIAGNOSIS — Z955 Presence of coronary angioplasty implant and graft: Secondary | ICD-10-CM | POA: Insufficient documentation

## 2019-08-14 DIAGNOSIS — I2510 Atherosclerotic heart disease of native coronary artery without angina pectoris: Secondary | ICD-10-CM | POA: Insufficient documentation

## 2019-08-14 DIAGNOSIS — Z87891 Personal history of nicotine dependence: Secondary | ICD-10-CM | POA: Insufficient documentation

## 2019-08-14 DIAGNOSIS — R2240 Localized swelling, mass and lump, unspecified lower limb: Secondary | ICD-10-CM | POA: Insufficient documentation

## 2019-08-14 DIAGNOSIS — R0789 Other chest pain: Secondary | ICD-10-CM

## 2019-08-14 DIAGNOSIS — F419 Anxiety disorder, unspecified: Secondary | ICD-10-CM | POA: Insufficient documentation

## 2019-08-14 DIAGNOSIS — Z7982 Long term (current) use of aspirin: Secondary | ICD-10-CM | POA: Insufficient documentation

## 2019-08-14 DIAGNOSIS — Z79899 Other long term (current) drug therapy: Secondary | ICD-10-CM | POA: Insufficient documentation

## 2019-08-14 DIAGNOSIS — I252 Old myocardial infarction: Secondary | ICD-10-CM | POA: Insufficient documentation

## 2019-08-14 LAB — CBC
HCT: 37.8 % (ref 36.0–46.0)
Hemoglobin: 12.6 g/dL (ref 12.0–15.0)
MCH: 30.4 pg (ref 26.0–34.0)
MCHC: 33.3 g/dL (ref 30.0–36.0)
MCV: 91.1 fL (ref 80.0–100.0)
Platelets: 407 10*3/uL — ABNORMAL HIGH (ref 150–400)
RBC: 4.15 MIL/uL (ref 3.87–5.11)
RDW: 12 % (ref 11.5–15.5)
WBC: 4.7 10*3/uL (ref 4.0–10.5)
nRBC: 0 % (ref 0.0–0.2)

## 2019-08-14 LAB — BASIC METABOLIC PANEL
Anion gap: 9 (ref 5–15)
BUN: 6 mg/dL (ref 6–20)
CO2: 23 mmol/L (ref 22–32)
Calcium: 9 mg/dL (ref 8.9–10.3)
Chloride: 106 mmol/L (ref 98–111)
Creatinine, Ser: 0.76 mg/dL (ref 0.44–1.00)
GFR calc Af Amer: >60 mL/min (ref >60)
GFR calc non Af Amer: >60 mL/min (ref >60)
Glucose, Bld: 105 mg/dL — ABNORMAL HIGH (ref 70–99)
Potassium: 3.9 mmol/L (ref 3.5–5.1)
Sodium: 138 mmol/L (ref 135–145)

## 2019-08-14 LAB — I-STAT BETA HCG BLOOD, ED (MC, WL, AP ONLY): I-stat hCG, quantitative: <5 m[IU]/mL (ref <5)

## 2019-08-14 LAB — TROPONIN I (HIGH SENSITIVITY)
Troponin I (High Sensitivity): 3 ng/L (ref <18)
Troponin I (High Sensitivity): 3 ng/L (ref <18)

## 2019-08-14 MED ORDER — SODIUM CHLORIDE 0.9% FLUSH: 3.0000 mL | Freq: Once | INTRAVENOUS | Status: DC

## 2019-08-14 NOTE — ED Provider Notes (Signed)
[facility] EMERGENCY DEPARTMENT Provider Note   CSN: [id number] Arrival date & time: 08/14/19  1324     History Chief Complaint  Patient presents with  . Chest Pain    Kristina Krause is a 49 y.o. female.  Patient with history of MI 4 years ago on Plavix status post stenting presents the emergency department with chest discomfort which started yesterday.  Patient went woke up several times during the night.  She admits to feeling very anxious.  She has a 3 out of 10 mid chest discomfort that does not radiate.  She describes it as a light pressure.  She does not have any associated diaphoresis or vomiting.  Her symptoms feel very different than when she had her heart attack a few years ago.  Patient denies any fevers, cough, shortness of breath.  She endorses mild chronic lower extremity swelling which is unchanged.  No abdominal pain, diarrhea, urinary symptoms.  She is taking her medications.  She had a Myoview stress test on 06/29/2019 which was normal.  History of DVT with pregnancy.        Past Medical History:  Diagnosis Date  . DVT (deep vein thrombosis) in pregnancy   . Myocardial infarct [facility])     Patient Active Problem List   Diagnosis Date Noted  . CAD (coronary artery disease) 06/22/2019    Past Surgical History:  Procedure Laterality Date  . CORONARY STENT PLACEMENT  05/2015     OB History   No obstetric history on file.     No family history on file.  Social History   Tobacco Use  . Smoking status: Former Smoker    Types: Cigarettes  . Smokeless tobacco: Never Used  Substance Use Topics  . Alcohol use: Yes    Comment: occ  . Drug use: Not on file    Home Medications Prior to Admission medications   Medication Sig Start Date End Date Taking? Authorizing Provider  aspirin EC 81 MG tablet Take 81 mg by mouth daily.    [provider]  clopidogrel (PLAVIX) 75 MG tablet Take 1 tablet (75 mg total) by mouth daily. 05/17/19    [name], NP  lisinopril (ZESTRIL) 2.5 MG tablet Take 1 tablet (2.5 mg total) by mouth daily. 05/17/19   [name], NP  Metoprolol Succinate 25 MG CS24 Take 25 mg by mouth daily. 05/17/19   [name], NP    Allergies    Aspirin and Ibuprofen  Review of Systems   Review of Systems  Constitutional: Negative for diaphoresis and fever.  Eyes: Negative for redness.  Respiratory: Negative for cough and shortness of breath.   Cardiovascular: Positive for chest pain. Negative for palpitations and leg swelling.  Gastrointestinal: Negative for abdominal pain, nausea and vomiting.  Genitourinary: Negative for dysuria.  Musculoskeletal: Negative for back pain and neck pain.  Skin: Negative for rash.  Neurological: Negative for syncope and light-headedness.  Psychiatric/Behavioral: The patient is nervous/anxious.     Physical Exam Updated Vital Signs BP (!) 129/56 (BP Location: Right Arm)   Pulse 86   Temp 97.8 F (36.6 C) (Oral)   Resp 18   LMP 07/21/2019   SpO2 100%   Physical Exam Vitals and nursing note reviewed.  Constitutional:      Appearance: She is well-developed. She is not diaphoretic.  HENT:     Head: Normocephalic and atraumatic.     Mouth/Throat:     Mouth: Mucous membranes are  not dry.  Eyes:     Conjunctiva/sclera: Conjunctivae normal.  Neck:     Vascular: Normal carotid pulses. No carotid bruit or JVD.     Trachea: Trachea normal. No tracheal deviation.  Cardiovascular:     Rate and Rhythm: Normal rate and regular rhythm.     Pulses: No decreased pulses.     Heart sounds: Normal heart sounds, S1 normal and S2 normal. No murmur.  Pulmonary:     Effort: Pulmonary effort is normal. No respiratory distress.     Breath sounds: No wheezing, rhonchi or rales.  Chest:     Chest wall: No tenderness.  Abdominal:     General: Bowel sounds are normal.     Palpations: Abdomen is soft.     Tenderness: There is no abdominal tenderness. There  is no guarding or rebound.  Musculoskeletal:        General: Normal range of motion.     Cervical back: Normal range of motion and neck supple. No muscular tenderness.     Right lower leg: No tenderness. No edema.     Left lower leg: No tenderness. No edema.  Skin:    General: Skin is warm and dry.     Coloration: Skin is not pale.  Neurological:     Mental Status: She is alert.  Psychiatric:        Mood and Affect: Mood is anxious.     ED Results / Procedures / Treatments   Labs (all labs ordered are listed, but only abnormal results are displayed) Labs Reviewed  BASIC METABOLIC PANEL - Abnormal; Notable for the following components:      Result Value   Glucose, Bld 105 (*)    All other components within normal limits  CBC - Abnormal; Notable for the following components:   Platelets 407 (*)    All other components within normal limits  I-STAT BETA HCG BLOOD, ED ([facility], [facility], [facility] ONLY)  TROPONIN I (HIGH SENSITIVITY)  TROPONIN I (HIGH SENSITIVITY)    EKG EKG Interpretation  Date/Time:  Monday August 14 2019 13:25:55 EST Ventricular Rate:  83 PR Interval:  130 QRS Duration: 74 QT Interval:  358 QTC Calculation: 420 R Axis:   29 Text Interpretation: Normal sinus rhythm Septal infarct , age undetermined Abnormal ECG No significant change since last tracing Confirmed by Tilden Fossa [phone]) on 08/14/2019 3:47:47 PM   Radiology DG Chest 2 View  Result Date: 08/14/2019 CLINICAL DATA:  Chest pain. EXAM: CHEST - 2 VIEW COMPARISON:  April 17, 2019. FINDINGS: The heart size and mediastinal contours are within normal limits. Both lungs are clear. No pneumothorax or pleural effusion is noted. The visualized skeletal structures are unremarkable. IMPRESSION: No active cardiopulmonary disease. Electronically Signed   By: Lupita Raider M.D.   On: 08/14/2019 13:51    Procedures Procedures (including critical care time)  Medications Ordered in ED Medications  sodium chloride flush  (NS) 0.9 % injection 3 mL (has no administration in time range)    ED Course  I have reviewed the triage vital signs and the nursing notes.  Pertinent labs & imaging results that were available during my care of the patient were reviewed by me and considered in my medical decision making (see chart for details).  Patient seen and examined.  History of previous MI and stent.  Symptoms are somewhat atypical for ACS.  First troponin negative.  EKG reviewed and is normal and unchanged.  Chest x-ray is clear.  Awaiting second troponin.  Patient clearly states that her current symptoms are different than her previous MI.  Symptoms are not suspicious for PE at this time.  Patient is not tachycardic, hypoxic, hypotensive or complaining of shortness of breath.  Vital signs reviewed and are as follows: BP (!) 129/56 (BP Location: Right Arm)   Pulse 86   Temp 97.8 F (36.6 C) (Oral)   Resp 18   LMP 07/21/2019   SpO2 100%   5:40 PM Troponin unchanged. Pt updated. Discussed with Dr. Madilyn Hook.   Plan: D/c home.  Patient is comfortable with this.  She states that she has an upcoming appointment with her cardiologist, Dr. Cristal Deer, tomorrow.  Patient was counseled to return with severe chest pain, especially if the pain is crushing or pressure-like and spreads to the arms, back, neck, or jaw, or if they have sweating, nausea, or shortness of breath with the pain. They were encouraged to call 911 with these symptoms.   They were also told to return if their chest pain gets worse and does not go away with rest, they have an attack of chest pain lasting longer than usual despite rest and treatment with the medications their caregiver has prescribed, if they wake from sleep with chest pain or shortness of breath, if they feel dizzy or faint, if they have chest pain not typical of their usual pain, or if they have any other emergent concerns regarding their health.  The patient verbalized understanding and  agreed.       MDM Rules/Calculators/A&P                      Patient presents with chest discomfort in setting of previous MI, recent reassuring stress test.  Patient admits to feeling very anxious regarding her chest symptoms.  Her current symptoms are different than her previous MI.  It is atypical in the sense that she does not have associated vomiting, diaphoresis and symptoms occur at rest and not with exertion.  Pain also does not radiate.  Work-up in the ED today with 2 normal troponins, nonischemic EKG, clear chest x-ray.  Her vital signs are reassuring and she is not tachycardic, hypoxic, or hypotensive.  Patient looks very comfortable although a bit anxious.  She has upcoming follow-up with her cardiologist (tomorrow) and would also likely benefit from treatment of anxiety.  Will defer this to primary care doctor.   Final Clinical Impression(s) / ED Diagnoses Final diagnoses:  Chest discomfort    Rx / DC Orders ED Discharge Orders    None       Renne Crigler, Cordelia Poche 08/14/19 1745    Tilden Fossa, MD 08/14/19 1859

## 2019-08-14 NOTE — ED Triage Notes (Signed)
Pt c/o non-radiating central chest pain that started this am. Denies shortness of breath. Hx MI and stent.

## 2019-08-14 NOTE — Discharge Instructions (Signed)
Please read and follow all provided instructions.  Your diagnoses today include:  1. Chest discomfort     Tests performed today include:  An EKG of your heart  A chest x-ray  Cardiac enzymes - a blood test for heart muscle damage  Blood counts and electrolytes  Vital signs. See below for your results today.   Medications prescribed:   None  Take any prescribed medications only as directed.  Follow-up instructions: Please follow-up with your primary care provider as soon as you can for further evaluation of your symptoms.   Return instructions:  SEEK IMMEDIATE MEDICAL ATTENTION IF:  You have severe chest pain, especially if the pain is crushing or pressure-like and spreads to the arms, back, neck, or jaw, or if you have sweating, nausea (feeling sick to your stomach), or shortness of breath. THIS IS AN EMERGENCY. Don't wait to see if the pain will go away. Get medical help at once. Call 911 or 0 (operator). DO NOT drive yourself to the hospital.   Your chest pain gets worse and does not go away with rest.   You have an attack of chest pain lasting longer than usual, despite rest and treatment with the medications your caregiver has prescribed.   You wake from sleep with chest pain or shortness of breath.  You feel dizzy or faint.  You have chest pain not typical of your usual pain for which you originally saw your caregiver.   You have any other emergent concerns regarding your health.  Additional Information: Chest pain comes from many different causes. Your caregiver has diagnosed you as having chest pain that is not specific for one problem, but does not require admission.  You are at low risk for an acute heart condition or other serious illness.   Your vital signs today were: BP 122/62   Pulse (!) 56   Temp 97.8 F (36.6 C) (Oral)   Resp 14   LMP 07/21/2019   SpO2 100%  If your blood pressure (BP) was elevated above 135/85 this visit, please have this  repeated by your doctor within one month. --------------

## 2019-08-15 ENCOUNTER — Encounter: Payer: Self-pay | Admitting: Cardiology

## 2019-08-15 ENCOUNTER — Ambulatory Visit (INDEPENDENT_AMBULATORY_CARE_PROVIDER_SITE_OTHER): Payer: Self-pay | Admitting: Cardiology

## 2019-08-15 VITALS — BP 112/76 | HR 60 | Temp 97.3°F | Ht 64.0 in | Wt 205.0 lb

## 2019-08-15 DIAGNOSIS — I2510 Atherosclerotic heart disease of native coronary artery without angina pectoris: Secondary | ICD-10-CM

## 2019-08-15 DIAGNOSIS — Z9861 Coronary angioplasty status: Secondary | ICD-10-CM | POA: Insufficient documentation

## 2019-08-15 DIAGNOSIS — Z7189 Other specified counseling: Secondary | ICD-10-CM

## 2019-08-15 DIAGNOSIS — Z8249 Family history of ischemic heart disease and other diseases of the circulatory system: Secondary | ICD-10-CM

## 2019-08-15 NOTE — Progress Notes (Signed)
Cardiology Office Note:    Date:  08/15/2019   ID:  [name], DOB [birth year], MRN [id number]  PCP:  [name], NP  Cardiologist:  Buford Dresser, MD  Referring MD: [name], NP   CC: follow up  History of Present Illness:    Kristina Krause is a 49 y.o. female with a hx of CAD s/p PCI 2016 (in [place]--no records), DVT during pregnancy in remote past, panic attacks who is seen for follow up. I initially met her 06/12/19 via televisit for chest pain.  Cardiac history: MI treated in 2016 at [facility] in [place] (now [place]. [place] Had [place] 06/2019 for atypical chest pain--low risk, normal EF, small scar but no ischemia.   Today: Went to ER yesterday afternoon for chest pain. Noted as 3/10 chest discomfort, nonradiating, nonexertional. No associated symptoms. hsTn 3, 3. ECG without acute changes. Low suspicion for cardiac etiology per notes.  Today feeling better, no chest discomfort. Her menses started today, and she has noticed occasional varied symptoms with her menses in the past as well.  Struggling a lot with anxiety, has affected her ability to hold a job. We discussed this at length today. She no longer feels like she can drive significant distances because she is so worried. She has had intermittent issues with this in the past and has tried medications (recently hydroxyzine, but she believes zoloft and buspar in the past as well). She has previously been able to manage by using relaxation techniques and improving her diet, but this has not improved her symptoms significantly at this time.   Denies shortness of breath at rest or with normal exertion. No PND, orthopnea, LE edema or unexpected weight gain. No syncope.  Past Medical History:  Diagnosis Date  . DVT (deep vein thrombosis) in pregnancy   . Myocardial infarct [facility]     Past Surgical History:  Procedure Laterality Date  . CORONARY STENT PLACEMENT  05/2015    Current Medications: Current  Outpatient Medications on File Prior to Visit  Medication Sig  . aspirin EC 81 MG tablet Take 81 mg by mouth daily.  . clopidogrel (PLAVIX) 75 MG tablet Take 1 tablet (75 mg total) by mouth daily.  Marland Kitchen lisinopril (ZESTRIL) 2.5 MG tablet Take 1 tablet (2.5 mg total) by mouth daily.  . Metoprolol Succinate 25 MG CS24 Take 25 mg by mouth daily.   No current facility-administered medications on file prior to visit.     Allergies:   Aspirin and Ibuprofen   Social History   Tobacco Use  . Smoking status: Former Smoker    Types: Cigarettes  . Smokeless tobacco: Never Used  Substance Use Topics  . Alcohol use: Yes    Comment: occ  . Drug use: Not on file    Family History: Mother and father each had MI later in life. Father died age 20, mother is living in her 38s  ROS:   Please see the history of present illness.  Additional pertinent ROS otherwise unremarkable    EKGs/Labs/Other Studies Reviewed:    The following studies were reviewed today: Nuclear stress 06/29/19  The left ventricular ejection fraction is normal (55-65%).  Nuclear stress EF: 57%. Mild hypokinesis of the septal wall region  There was no ST segment deviation noted during stress.  Findings consistent with prior myocardial infarction.  This is an intermediate risk study based upon prior anteroseptal infarct pattern. However, there is no ischemia identified.  EKG:  EKG is personally reviewed.  The ekg ordered today demonstrates NSR, prior septal infarct  Recent Labs: 08/14/2019: BUN 6; Creatinine, Ser 0.76; Hemoglobin 12.6; Platelets 407; Potassium 3.9; Sodium 138  Recent Lipid Panel No results found for: CHOL, TRIG, HDL, CHOLHDL, VLDL, LDLCALC, LDLDIRECT  Physical Exam:    VS:  BP 112/76   Pulse 60   Temp (!) 97.3 F (36.3 C)   Ht 5' 4"  (1.626 m)   Wt 205 lb (93 kg)   LMP 07/21/2019   SpO2 98%   BMI 35.19 kg/m     Wt Readings from Last 3 Encounters:  08/15/19 205 lb (93 kg)  06/29/19 198 lb  (89.8 kg)  05/25/19 198 lb (89.8 kg)    GEN: Well nourished, well developed in no acute distress HEENT: Normal, moist mucous membranes NECK: No JVD CARDIAC: regular rhythm, normal S1 and S2, no rubs or gallops. No murmurs. VASCULAR: Radial and DP pulses 2+ bilaterally. No carotid bruits RESPIRATORY:  Clear to auscultation without rales, wheezing or rhonchi  ABDOMEN: Soft, non-tender, non-distended MUSCULOSKELETAL:  Ambulates independently SKIN: Warm and dry, no edema NEUROLOGIC:  Alert and oriented x 3. No focal neuro deficits noted. PSYCHIATRIC:  Normal affect    ASSESSMENT:    1. Coronary artery disease involving native coronary artery of native heart, angina presence unspecified   2. S/P coronary angioplasty   3. Family history of heart disease   4. Cardiac risk counseling   5. Counseling on health promotion and disease prevention    PLAN:    History of CAD with prior MI and PCI in [place]: has intermittent atypical chest pain.  -ECG unchanged today -lexiscan without ischemia 06/2019 -we spoke at length today--significant lifestyle limiting anxiety symptoms. I recommended she discuss with her PCP to see if she needs medication to help her with this, as diet/lifestyle changes alone have not been able to manage -she continues on aspirin 81 mg and clopidogrel 75 mg daily. We may be able to stop the clopidogrel, would like records from [place] if possible -on metoprolol and lisinopril. Unclear what her EF was at the time of her MI, records would be helpful. EF on nuclear stress 57% -I would recommend a statin given her known CAD history. We have previously discussed. She prefers to manage with lifestyle, but I would continue to readdress given the guidelines  Cardiac risk counseling and prevention recommendations: -recommend heart healthy/Mediterranean diet, with whole grains, fruits, vegetable, fish, lean meats, nuts, and olive oil. Limit salt. -recommend moderate walking, 3-5  times/week for 30-50 minutes each session. Aim for at least 150 minutes.week. Goal should be pace of 3 miles/hours, or walking 1.5 miles in 30 minutes -recommend avoidance of tobacco products. Avoid excess alcohol.  Plan for follow up: 6 mos or sooner as needed  Buford Dresser, MD, PhD [place]  [facility]    Medication Adjustments/Labs and Tests Ordered: Current medicines are reviewed at length with the patient today.  Concerns regarding medicines are outlined above.  Orders Placed This Encounter  Procedures  . EKG 12-Lead   No orders of the defined types were placed in this encounter.   Patient Instructions  Medication Instructions:  Your Physician recommend you continue on your current medication as directed.    *If you need a refill on your cardiac medications before your next appointment, please call your pharmacy*   Lab Work: Your physician recommends that you return for lab work today ( lipid)  If you have labs (blood work) drawn today and  your tests are completely normal, you will receive your results only by: Marland Kitchen MyChart Message (if you have MyChart) OR . A paper copy in the mail If you have any lab test that is abnormal or we need to change your treatment, we will call you to review the results.   Testing/Procedures: None   Follow-Up: At [facility], you and your health needs are our priority.  As part of our continuing mission to provide you with exceptional heart care, we have created designated Provider Care Teams.  These Care Teams include your primary Cardiologist (physician) and Advanced Practice Providers (APPs -  Physician Assistants and Nurse Practitioners) who all work together to provide you with the care you need, when you need it.  We recommend signing up for the patient portal called "MyChart".  Sign up information is provided on this After Visit Summary.  MyChart is used to connect with patients for Virtual Visits (Telemedicine).   Patients are able to view lab/test results, encounter notes, upcoming appointments, etc.  Non-urgent messages can be sent to your provider as well.   To learn more about what you can do with MyChart, go to [email or web address].    Your next appointment:   6 month(s)  The format for your next appointment:   In Person  Provider:   Buford Dresser, MD      Signed, Buford Dresser, MD PhD 08/15/2019 6:17 PM    [place]

## 2019-08-15 NOTE — Patient Instructions (Signed)
Medication Instructions:  Your Physician recommend you continue on your current medication as directed.    *If you need a refill on your cardiac medications before your next appointment, please call your pharmacy*   Lab Work: Your physician recommends that you return for lab work today ( lipid)  If you have labs (blood work) drawn today and your tests are completely normal, you will receive your results only by: Marland Kitchen MyChart Message (if you have MyChart) OR . A paper copy in the mail If you have any lab test that is abnormal or we need to change your treatment, we will call you to review the results.   Testing/Procedures: None   Follow-Up: At [facility], you and your health needs are our priority.  As part of our continuing mission to provide you with exceptional heart care, we have created designated Provider Care Teams.  These Care Teams include your primary Cardiologist (physician) and Advanced Practice Providers (APPs -  Physician Assistants and Nurse Practitioners) who all work together to provide you with the care you need, when you need it.  We recommend signing up for the patient portal called "MyChart".  Sign up information is provided on this After Visit Summary.  MyChart is used to connect with patients for Virtual Visits (Telemedicine).  Patients are able to view lab/test results, encounter notes, upcoming appointments, etc.  Non-urgent messages can be sent to your provider as well.   To learn more about what you can do with MyChart, go to [email or web address].    Your next appointment:   6 month(s)  The format for your next appointment:   In Person  Provider:   Jodelle Red, MD

## 2019-08-16 ENCOUNTER — Telehealth: Payer: Self-pay | Admitting: Cardiology

## 2019-08-16 ENCOUNTER — Telehealth: Payer: Self-pay

## 2019-08-16 DIAGNOSIS — Z79899 Other long term (current) drug therapy: Secondary | ICD-10-CM

## 2019-08-16 LAB — LIPID PANEL
Chol/HDL Ratio: 3.9 ratio (ref 0.0–4.4)
Cholesterol, Total: 235 mg/dL — ABNORMAL HIGH (ref 100–199)
HDL: 60 mg/dL (ref >39)
LDL Chol Calc (NIH): 157 mg/dL — ABNORMAL HIGH (ref 0–99)
Triglycerides: 104 mg/dL (ref 0–149)
VLDL Cholesterol Cal: 18 mg/dL (ref 5–40)

## 2019-08-16 MED ORDER — ROSUVASTATIN CALCIUM 20 MG PO TABS: 20.0000 mg | ORAL_TABLET | Freq: Every day | ORAL | 11 refills | Status: DC

## 2019-08-16 NOTE — Telephone Encounter (Signed)
Pt updated with lab results

## 2019-08-16 NOTE — Addendum Note (Signed)
Addended by: Parke Poisson on: 08/16/2019 03:32 PM   Modules accepted: Orders

## 2019-08-16 NOTE — Telephone Encounter (Signed)
Kristina Krause is returning Kristina Krause's phone call in regards to lab results.

## 2019-08-16 NOTE — Telephone Encounter (Signed)
See previous encounter

## 2019-09-22 ENCOUNTER — Encounter (HOSPITAL_COMMUNITY): Payer: Self-pay

## 2019-09-22 ENCOUNTER — Emergency Department (HOSPITAL_COMMUNITY)
Admission: EM | Admit: 2019-09-22 | Discharge: 2019-09-22 | Disposition: A | Discharge status: Home / Self Care | Payer: Medicaid Other | Attending: Emergency Medicine | Admitting: Emergency Medicine

## 2019-09-22 ENCOUNTER — Other Ambulatory Visit: Payer: Self-pay

## 2019-09-22 ENCOUNTER — Ambulatory Visit (HOSPITAL_COMMUNITY)
Admission: EM | Admit: 2019-09-22 | Discharge: 2019-09-22 | Disposition: A | Discharge status: Home / Self Care | Payer: Self-pay

## 2019-09-22 ENCOUNTER — Encounter (HOSPITAL_COMMUNITY): Payer: Self-pay | Admitting: Emergency Medicine

## 2019-09-22 DIAGNOSIS — R002 Palpitations: Secondary | ICD-10-CM | POA: Insufficient documentation

## 2019-09-22 DIAGNOSIS — Z5321 Procedure and treatment not carried out due to patient leaving prior to being seen by health care provider: Secondary | ICD-10-CM | POA: Insufficient documentation

## 2019-09-22 NOTE — ED Triage Notes (Signed)
Patient reports feeling like she has an arrhthymia or an irregular heart beat over the past week. Reports hx of anxiety and palpitations but feels like this is different. Denies any chest pain or shortness of breath.

## 2019-09-22 NOTE — ED Notes (Signed)
Patient came up to RN stating she could no longer wait and is leaving.

## 2019-09-22 NOTE — ED Triage Notes (Addendum)
Pt presents to UC with palpitations x 2 days. Pt states she have anxiety and panic attacks history, but this time she states "feels different". Pt reports she had a heart in 2016, pt have 1 stent. Pt denies chest pain, nausea, vomiting.

## 2019-10-06 ENCOUNTER — Other Ambulatory Visit: Payer: Self-pay | Admitting: Cardiology

## 2019-10-06 MED ORDER — CLOPIDOGREL BISULFATE 75 MG PO TABS: 75.0000 mg | ORAL_TABLET | Freq: Every day | ORAL | 11 refills | Status: DC

## 2019-10-06 MED ORDER — LISINOPRIL 2.5 MG PO TABS: 2.5000 mg | ORAL_TABLET | Freq: Every day | ORAL | 11 refills | Status: DC

## 2019-10-06 MED ORDER — METOPROLOL SUCCINATE 25 MG PO CS24: 25.0000 mg | EXTENDED_RELEASE_CAPSULE | Freq: Every day | ORAL | 11 refills | Status: DC

## 2019-10-06 NOTE — Telephone Encounter (Signed)
 *  STAT* If patient is at the pharmacy, call can be transferred to refill team.   1. Which medications need to be refilled? (please list name of each medication and dose if known)   lisinopril (ZESTRIL) 2.5 MG tablet clopidogrel (PLAVIX) 75 MG tablet Metoprolol Succinate 25 MG CS24  2. Which pharmacy/location (including street and city if local pharmacy) is medication to be sent to?   [id number] - [place], [place] - [address] AT [facility] [address]  3. Do they need a 30 day or 90 day supply? 30 days   PATIENT IS COMPLETELY OUT OF MEDICATION

## 2019-10-09 ENCOUNTER — Other Ambulatory Visit: Payer: Self-pay | Admitting: Cardiology

## 2019-10-17 ENCOUNTER — Ambulatory Visit (INDEPENDENT_AMBULATORY_CARE_PROVIDER_SITE_OTHER): Payer: Self-pay | Admitting: Cardiology

## 2019-10-17 ENCOUNTER — Other Ambulatory Visit: Payer: Self-pay

## 2019-10-17 ENCOUNTER — Telehealth: Payer: Self-pay | Admitting: Radiology

## 2019-10-17 VITALS — BP 118/78 | HR 60 | Ht 65.0 in | Wt 210.4 lb

## 2019-10-17 DIAGNOSIS — Z9861 Coronary angioplasty status: Secondary | ICD-10-CM

## 2019-10-17 DIAGNOSIS — R002 Palpitations: Secondary | ICD-10-CM

## 2019-10-17 DIAGNOSIS — I2510 Atherosclerotic heart disease of native coronary artery without angina pectoris: Secondary | ICD-10-CM

## 2019-10-17 DIAGNOSIS — Z8249 Family history of ischemic heart disease and other diseases of the circulatory system: Secondary | ICD-10-CM

## 2019-10-17 DIAGNOSIS — Z7189 Other specified counseling: Secondary | ICD-10-CM

## 2019-10-17 MED ORDER — ASPIRIN EC 81 MG PO TBEC: 81.0000 mg | DELAYED_RELEASE_TABLET | Freq: Every day | ORAL | Status: DC

## 2019-10-17 NOTE — Progress Notes (Signed)
Cardiology Office Note:    Date:  10/17/2019   ID:  Kristina Krause, DOB 1971-03-31, MRN [id number]  PCP:  Kristina Perna, NP  Cardiologist:  Kristina Dresser, MD  Referring MD: Kristina Perna, NP   CC: follow up  History of Present Illness:    Kristina Krause is a 49 y.o. female with a hx of CAD s/p PCI 2016 (in [place]--no records), DVT during pregnancy in remote past, panic attacks who is seen for follow up. I initially met her 06/12/19 via televisit for chest pain.  Cardiac history: MI treated in 2016 at [facility] in [place] (now [place]. [place] Had Progress 06/2019 for atypical chest pain--low risk, normal EF, small scar but no ischemia.   Today: For a week before her 09/22/19 ER visit, she was having palpitations. She was noticing palpitations very frequently. Went to ER, ECG sinus with a PVC. Didn't wait to be seen. Never had this type of sensation before. No syncope. No clear trigger. Better when she lays down, tries to relax. Sometimes is constant for more than 24 hours.  Was constant for the first two weeks, now easing up some. Tried to eat more meat, helped somewhat. Wonders if she has low magnesium as she read about this online.   Palpitations haven't happened yet today, but did happen yesterday. Discussed options for monitoring today.  Denies chest pain, shortness of breath at rest or with normal exertion. No PND, orthopnea, LE edema or unexpected weight gain. No syncope or palpitations.  Past Medical History:  Diagnosis Date  . DVT (deep vein thrombosis) in pregnancy   . Myocardial infarct [facility])     Past Surgical History:  Procedure Laterality Date  . CORONARY STENT PLACEMENT  05/2015    Current Medications: Current Outpatient Medications on File Prior to Visit  Medication Sig  . lisinopril (ZESTRIL) 2.5 MG tablet Take 1 tablet (2.5 mg total) by mouth daily.  . metoprolol succinate (TOPROL-XL) 25 MG 24 hr tablet Take 1 tablet (25 mg total) by mouth daily.  .  rosuvastatin (CRESTOR) 20 MG tablet Take 1 tablet (20 mg total) by mouth daily.  . clopidogrel (PLAVIX) 75 MG tablet Take 1 tablet (75 mg total) by mouth daily. (Patient not taking: Reported on 10/17/2019)   No current facility-administered medications on file prior to visit.     Allergies:   Aspirin and Ibuprofen   Social History   Tobacco Use  . Smoking status: Former Smoker    Types: Cigarettes  . Smokeless tobacco: Never Used  Substance Use Topics  . Alcohol use: Yes    Comment: occ  . Drug use: Not on file    Family History: Mother and father each had MI later in life. Father died age 55, mother is living in her 25s  ROS:   Please see the history of present illness.  Additional pertinent ROS otherwise unremarkable    EKGs/Labs/Other Studies Reviewed:    The following studies were reviewed today: Nuclear stress 06/29/19  The left ventricular ejection fraction is normal (55-65%).  Nuclear stress EF: 57%. Mild hypokinesis of the septal wall region  There was no ST segment deviation noted during stress.  Findings consistent with prior myocardial infarction.  This is an intermediate risk study based upon prior anteroseptal infarct pattern. However, there is no ischemia identified.  EKG:  EKG is personally reviewed.  The ekg ordered today demonstrates NSR, lack of anterior forces in V1-V2 similar to prior  Recent Labs: 08/14/2019: BUN 6;  Creatinine, Ser 0.76; Hemoglobin 12.6; Platelets 407; Potassium 3.9; Sodium 138  Recent Lipid Panel    Component Value Date/Time   CHOL 235 (H) 08/15/2019 1550   TRIG 104 08/15/2019 1550   HDL 60 08/15/2019 1550   CHOLHDL 3.9 08/15/2019 1550   LDLCALC 157 (H) 08/15/2019 1550    Physical Exam:    VS:  BP 118/78   Pulse 60   Ht 5' 5"  (1.651 m)   Wt 210 lb 6.4 oz (95.4 kg)   SpO2 99%   BMI 35.01 kg/m     Wt Readings from Last 3 Encounters:  10/17/19 210 lb 6.4 oz (95.4 kg)  09/22/19 188 lb (85.3 kg)  08/15/19 205 lb (93  kg)    GEN: Well nourished, well developed in no acute distress HEENT: Normal, moist mucous membranes NECK: No JVD CARDIAC: regular rhythm, normal S1 and S2, no rubs or gallops. No murmur. VASCULAR: Radial and DP pulses 2+ bilaterally. No carotid bruits RESPIRATORY:  Clear to auscultation without rales, wheezing or rhonchi  ABDOMEN: Soft, non-tender, non-distended MUSCULOSKELETAL:  Ambulates independently SKIN: Warm and dry, no edema NEUROLOGIC:  Alert and oriented x 3. No focal neuro deficits noted. PSYCHIATRIC:  Normal affect    ASSESSMENT:    1. Palpitations   2. Coronary artery disease involving native coronary artery of native heart, angina presence unspecified   3. S/P coronary angioplasty   4. Family history of heart disease   5. Cardiac risk counseling   6. Counseling on health promotion and disease prevention    PLAN:    Palpitations: -7 day zio monitor ordered today, instructed on use  History of CAD with prior MI and PCI in [place]: has intermittent atypical chest pain.  -ECG unchanged today -lexiscan without ischemia 06/2019 -she continues on aspirin 81 mg and clopidogrel 75 mg daily. We may be able to stop the clopidogrel, would like records from [place] if possible -on metoprolol and lisinopril. Unclear what her EF was at the time of her MI, records would be helpful. EF on nuclear stress 57% -I would recommend a statin given her known CAD history. We have previously discussed. She prefers to manage with lifestyle, but I would continue to readdress given the guidelines  Cardiac risk counseling and prevention recommendations: Secondary prevention given known history, also with family history of heart disease. -recommend heart healthy/Mediterranean diet, with whole grains, fruits, vegetable, fish, lean meats, nuts, and olive oil. Limit salt. -recommend moderate walking, 3-5 times/week for 30-50 minutes each session. Aim for at least 150 minutes.week. Goal should be pace of  3 miles/hours, or walking 1.5 miles in 30 minutes -recommend avoidance of tobacco products. Avoid excess alcohol.  Plan for follow up: 6 mos or sooner as needed  Kristina Dresser, MD, PhD [place]  [facility]    Medication Adjustments/Labs and Tests Ordered: Current medicines are reviewed at length with the patient today.  Concerns regarding medicines are outlined above.  Orders Placed This Encounter  Procedures  . LONG TERM MONITOR (3-14 DAYS)  . EKG 12-Lead   Meds ordered this encounter  Medications  . aspirin EC 81 MG tablet    Sig: Take 1 tablet (81 mg total) by mouth daily.    Dispense:       Patient Instructions  Medication Instructions:  Resume Aspirin 81 mg daily  *If you need a refill on your cardiac medications before your next appointment, please call your pharmacy*   Lab Work: None   Testing/Procedures:  Our physician has recommended that you wear an 7  DAY ZIO-PATCH monitor. The Zio patch cardiac monitor continuously records heart rhythm data for up to 14 days, this is for patients being evaluated for multiple types heart rhythms. For the first 24 hours post application, please avoid getting the Zio monitor wet in the shower or by excessive sweating during exercise. After that, feel free to carry on with regular activities. Keep soaps and lotions away from the ZIO XT Patch.   Someone will call from our office to mail monitor.      Follow-Up: At [facility], you and your health needs are our priority.  As part of our continuing mission to provide you with exceptional heart care, we have created designated Provider Care Teams.  These Care Teams include your primary Cardiologist (physician) and Advanced Practice Providers (APPs -  Physician Assistants and Nurse Practitioners) who all work together to provide you with the care you need, when you need it.  We recommend signing up for the patient portal called "MyChart".  Sign up information is  provided on this After Visit Summary.  MyChart is used to connect with patients for Virtual Visits (Telemedicine).  Patients are able to view lab/test results, encounter notes, upcoming appointments, etc.  Non-urgent messages can be sent to your provider as well.   To learn more about what you can do with MyChart, go to [email or web address].    Your next appointment:   6 month(s)  The format for your next appointment:   In Person  Provider:   Buford Dresser, MD  [place] Instructions   Your physician has requested you wear your ZIO patch monitor___7____days.   This is a single patch monitor.  Ir[employer] supplies one patch monitor per enrollment.  Additional stickers are not available.   Please do not apply patch if you will be having a Nuclear Stress Test, Echocardiogram, Cardiac CT, MRI, or Chest Xray during the time frame you would be wearing the monitor. The patch cannot be worn during these tests.  You cannot remove and re-apply the ZIO XT patch monitor.   Your ZIO patch monitor will be sent USPS Priority mail from [facility] directly to your home address. The monitor may also be mailed to a PO BOX if home delivery is not available.   It may take 3-5 days to receive your monitor after you have been enrolled.   Once you have received you monitor, please review enclosed instructions.  Your monitor has already been registered assigning a specific monitor serial # to you.   Applying the monitor   Shave hair from upper left chest.   Hold abrader disc by orange tab.  Rub abrader in 40 strokes over left upper chest as indicated in your monitor instructions.   Clean area with 4 enclosed alcohol pads .  Use all pads to assure are is cleaned thoroughly.  Let dry.   Apply patch as indicated in monitor instructions.  Patch will be place under collarbone on left side of chest with arrow pointing upward.   Rub patch adhesive wings for 2 minutes.Remove white  label marked "1".  Remove white label marked "2".  Rub patch adhesive wings for 2 additional minutes.   While looking in a mirror, press and release button in center of patch.  A small green light will flash 3-4 times .  This will be your only indicator the monitor has been turned on.     Do not  shower for the first 24 hours.  You may shower after the first 24 hours.   Press button if you feel a symptom. You will hear a small click.  Record Date, Time and Symptom in the Patient Log Book.   When you are ready to remove patch, follow instructions on last 2 pages of Patient Log Book.  Stick patch monitor onto last page of Patient Log Book.   Place Patient Log Book in [place] box.  Use locking tab on box and tape box closed securely.  The Orange and [employer] has [employer] on it.  Please place in mailbox as soon as possible.  Your physician should have your test results approximately 7 days after the monitor has been mailed back to [facility]   Call [place] at [phone] if you have questions regarding your ZIO XT patch monitor.  Call them immediately if you see an orange light blinking on your monitor.   If your monitor falls off in less than 4 days contact our Monitor department at [phone].  If your monitor becomes loose or falls off after 4 days call [employer] at [phone] for suggestions on securing your monitor.      Signed, Kristina Dresser, MD PhD 10/17/2019     JAARS

## 2019-10-17 NOTE — Patient Instructions (Signed)
Medication Instructions:  Resume Aspirin 81 mg daily  *If you need a refill on your cardiac medications before your next appointment, please call your pharmacy*   Lab Work: None   Testing/Procedures: Our physician has recommended that you wear an Gate monitor. The Zio patch cardiac monitor continuously records heart rhythm data for up to 14 days, this is for patients being evaluated for multiple types heart rhythms. For the first 24 hours post application, please avoid getting the Zio monitor wet in the shower or by excessive sweating during exercise. After that, feel free to carry on with regular activities. Keep soaps and lotions away from the ZIO XT Patch.   Someone will call from our office to mail monitor.      Follow-Up: At [facility], you and your health needs are our priority.  As part of our continuing mission to provide you with exceptional heart care, we have created designated Provider Care Teams.  These Care Teams include your primary Cardiologist (physician) and Advanced Practice Providers (APPs -  Physician Assistants and Nurse Practitioners) who all work together to provide you with the care you need, when you need it.  We recommend signing up for the patient portal called "MyChart".  Sign up information is provided on this After Visit Summary.  MyChart is used to connect with patients for Virtual Visits (Telemedicine).  Patients are able to view lab/test results, encounter notes, upcoming appointments, etc.  Non-urgent messages can be sent to your provider as well.   To learn more about what you can do with MyChart, go to [email or web address].    Your next appointment:   6 month(s)  The format for your next appointment:   In Person  Provider:   Buford Dresser, MD  [place] Instructions   Your physician has requested you wear your ZIO patch monitor___7____days.   This is a single patch monitor.  Irhythm supplies one patch  monitor per enrollment.  Additional stickers are not available.   Please do not apply patch if you will be having a Nuclear Stress Test, Echocardiogram, Cardiac CT, MRI, or Chest Xray during the time frame you would be wearing the monitor. The patch cannot be worn during these tests.  You cannot remove and re-apply the ZIO XT patch monitor.   Your ZIO patch monitor will be sent USPS Priority mail from [facility] directly to your home address. The monitor may also be mailed to a PO BOX if home delivery is not available.   It may take 3-5 days to receive your monitor after you have been enrolled.   Once you have received you monitor, please review enclosed instructions.  Your monitor has already been registered assigning a specific monitor serial # to you.   Applying the monitor   Shave hair from upper left chest.   Hold abrader disc by orange tab.  Rub abrader in 40 strokes over left upper chest as indicated in your monitor instructions.   Clean area with 4 enclosed alcohol pads .  Use all pads to assure are is cleaned thoroughly.  Let dry.   Apply patch as indicated in monitor instructions.  Patch will be place under collarbone on left side of chest with arrow pointing upward.   Rub patch adhesive wings for 2 minutes.Remove white label marked "1".  Remove white label marked "2".  Rub patch adhesive wings for 2 additional minutes.   While looking in a mirror, press and release button  in center of patch.  A small green light will flash 3-4 times .  This will be your only indicator the monitor has been turned on.     Do not shower for the first 24 hours.  You may shower after the first 24 hours.   Press button if you feel a symptom. You will hear a small click.  Record Date, Time and Symptom in the Patient Log Book.   When you are ready to remove patch, follow instructions on last 2 pages of Patient Log Book.  Stick patch monitor onto last page of Patient Log Book.   Place Patient  Log Book in [place] box.  Use locking tab on box and tape box closed securely.  The Orange and [employer] has [employer] on it.  Please place in mailbox as soon as possible.  Your physician should have your test results approximately 7 days after the monitor has been mailed back to [facility].   Call [facility] Customer Care at [phone] if you have questions regarding your ZIO XT patch monitor.  Call them immediately if you see an orange light blinking on your monitor.   If your monitor falls off in less than 4 days contact our Monitor department at [phone].  If your monitor becomes loose or falls off after 4 days call [employer] at [phone] for suggestions on securing your monitor.

## 2019-10-17 NOTE — Telephone Encounter (Signed)
Enrolled patient for a 7 day Zio monitor to be mailed to patients home.  

## 2019-10-22 ENCOUNTER — Ambulatory Visit (INDEPENDENT_AMBULATORY_CARE_PROVIDER_SITE_OTHER): Payer: Self-pay

## 2019-10-22 DIAGNOSIS — R002 Palpitations: Secondary | ICD-10-CM

## 2019-12-05 ENCOUNTER — Encounter: Payer: Self-pay | Admitting: Cardiology

## 2019-12-15 ENCOUNTER — Telehealth: Payer: Self-pay | Admitting: Cardiology

## 2019-12-15 NOTE — Telephone Encounter (Signed)
Patient c/o Palpitations:  High priority if patient c/o lightheadedness, shortness of breath, or chest pain  1) How long have you had palpitations/irregular HR/ Afib? Are you having the symptoms now? A month, yes  2) Are you currently experiencing lightheadedness, SOB or CP? no  3) Do you have a history of afib (atrial fibrillation) or irregular heart rhythm? no  4) Have you checked your BP or HR? (document readings if available): BP is normal  5) Are you experiencing any other symptoms? no  Patient states she has been having an irregular heart rhythm and it has not been getting any better. She state some weeks better than others and that it comes and goes. She is not having any other symptoms and is scheduled 12/18/2019 for a virtual appointment.

## 2019-12-15 NOTE — Telephone Encounter (Signed)
Spoke to pt who report she is still having the same symptoms of palpation but feel they have worsened this week. She report symptoms is occuring off and on all day and doesn't know if it could be related to her menstrual cycle. Pt denies dizziness or feeling like she is going to black out, but voiced she just feels real anxious during episode.  Pt also report she wore monitor for 14 days and mailed it back 2 weeks ago. Results have not been uploaded to chart but will route to MD for further recommendations.

## 2019-12-18 ENCOUNTER — Telehealth (INDEPENDENT_AMBULATORY_CARE_PROVIDER_SITE_OTHER): Payer: Self-pay | Admitting: Cardiology

## 2019-12-18 ENCOUNTER — Encounter: Payer: Self-pay | Admitting: Cardiology

## 2019-12-18 DIAGNOSIS — Z8249 Family history of ischemic heart disease and other diseases of the circulatory system: Secondary | ICD-10-CM

## 2019-12-18 DIAGNOSIS — Z9861 Coronary angioplasty status: Secondary | ICD-10-CM

## 2019-12-18 DIAGNOSIS — R002 Palpitations: Secondary | ICD-10-CM

## 2019-12-18 DIAGNOSIS — I2510 Atherosclerotic heart disease of native coronary artery without angina pectoris: Secondary | ICD-10-CM

## 2019-12-18 NOTE — Telephone Encounter (Signed)
Pt discussed concerns with MD at virtual appointment today 7/12.

## 2019-12-18 NOTE — Patient Instructions (Signed)
Medication Instructions:  Your Physician recommend you continue on your current medication as directed.    *If you need a refill on your cardiac medications before your next appointment, please call your pharmacy*   Lab Work: Your physician recommends that you return for lab work (TSH, T3, free t4, CBC, BMP)   If you have labs (blood work) drawn today and your tests are completely normal, you will receive your results only by: Marland Kitchen MyChart Message (if you have MyChart) OR . A paper copy in the mail If you have any lab test that is abnormal or we need to change your treatment, we will call you to review the results.   Testing/Procedures: None   Follow-Up: At [facility], you and your health needs are our priority.  As part of our continuing mission to provide you with exceptional heart care, we have created designated Provider Care Teams.  These Care Teams include your primary Cardiologist (physician) and Advanced Practice Providers (APPs -  Physician Assistants and Nurse Practitioners) who all work together to provide you with the care you need, when you need it.  We recommend signing up for the patient portal called "MyChart".  Sign up information is provided on this After Visit Summary.  MyChart is used to connect with patients for Virtual Visits (Telemedicine).  Patients are able to view lab/test results, encounter notes, upcoming appointments, etc.  Non-urgent messages can be sent to your provider as well.   To learn more about what you can do with MyChart, go to [email or web address].    Your next appointment:   1 month(s)  The format for your next appointment:   Either In Person or Virtual  Provider:   Jodelle Red, MD

## 2019-12-18 NOTE — Progress Notes (Addendum)
Virtual Visit via Telephone Note   This visit type was conducted due to national recommendations for restrictions regarding the COVID-19 Pandemic (e.g. social distancing) in an effort to limit this patient's exposure and mitigate transmission in our community.  Due to her co-morbid illnesses, this patient is at least at moderate risk for complications without adequate follow up.  This format is felt to be most appropriate for this patient at this time.  The patient did not have access to video technology/had technical difficulties with video requiring transitioning to audio format only (telephone).  All issues noted in this document were discussed and addressed.  No physical exam could be performed with this format.  Please refer to the patient's chart for her  consent to telehealth for [facility].   The patient was identified using 2 identifiers. Patient Location: Other:  Her workplace Provider Location: Home Office  Date:  12/18/2019   ID:  Kristina Krause, DOB August 06, 1970, MRN [id number]  PCP:  [name], NP  Cardiologist:  Buford Dresser, MD  Referring MD: [name], NP   CC: follow up  History of Present Illness:    Kristina Krause is a 49 y.o. female with a hx of CAD s/p PCI 2016 (in [place]--no records), DVT during pregnancy in remote past, panic attacks who is seen for follow up. I initially met her 06/12/19 via televisit for chest pain.  Cardiac history: MI treated in 2016 at [facility] in [place] (now [place]. [place]). Had [place] 06/2019 for atypical chest pain--low risk, normal EF, small scar but no ischemia.   Today: Having a lot more palpitations. Last week was especially bad. Has panic/anxiety with these episodes. Worse with her menstrual cycle. Happening every day, multiple times throughout the day. Happens nearly constantly on days she is on her menses, occurs 1-2 times/day on non-menstrual days. Was the most intense it has been last week. Feels like  fluttering  Wore monitor for a few days (4-5 days) then it fell off. Sent it back about 2 weeks ago. No results in our system.  Get results from the monitor, get thyroid, CBC, BMET, try doubling metoprolol over her menses. Send lab slips, [place] appt second week of august. 15 min  Denies chest pain, shortness of breath at rest or with normal exertion. No PND, orthopnea, LE edema or unexpected weight gain. No syncope.  Past Medical History:  Diagnosis Date  . DVT (deep vein thrombosis) in pregnancy   . Myocardial infarct [facility])     Past Surgical History:  Procedure Laterality Date  . CORONARY STENT PLACEMENT  05/2015    Current Medications: Current Outpatient Medications on File Prior to Visit  Medication Sig  . aspirin EC 81 MG tablet Take 1 tablet (81 mg total) by mouth daily.  Marland Kitchen lisinopril (ZESTRIL) 2.5 MG tablet Take 1 tablet (2.5 mg total) by mouth daily.  . metoprolol succinate (TOPROL-XL) 25 MG 24 hr tablet Take 1 tablet (25 mg total) by mouth daily.  . rosuvastatin (CRESTOR) 20 MG tablet Take 1 tablet (20 mg total) by mouth daily.   No current facility-administered medications on file prior to visit.     Allergies:   Aspirin and Ibuprofen   Social History   Tobacco Use  . Smoking status: Former Smoker    Types: Cigarettes  . Smokeless tobacco: Never Used  Substance Use Topics  . Alcohol use: Yes    Comment: occ  . Drug use: Not on file  Family History: Mother and father each had MI later in life. Father died age 70, mother is living in her 65s  ROS:   Please see the history of present illness.  Additional pertinent ROS otherwise unremarkable    EKGs/Labs/Other Studies Reviewed:    The following studies were reviewed today: Nuclear stress 06/29/19  The left ventricular ejection fraction is normal (55-65%).  Nuclear stress EF: 57%. Mild hypokinesis of the septal wall region  There was no ST segment deviation noted during  stress.  Findings consistent with prior myocardial infarction.  This is an intermediate risk study based upon prior anteroseptal infarct pattern. However, there is no ischemia identified.  EKG:  EKG is personally reviewed.  The ekg ordered 10/17/19 demonstrates NSR, lack of anterior forces in V1-V2 similar to prior  Recent Labs: 08/14/2019: BUN 6; Creatinine, Ser 0.76; Hemoglobin 12.6; Platelets 407; Potassium 3.9; Sodium 138  Recent Lipid Panel    Component Value Date/Time   CHOL 235 (H) 08/15/2019 1550   TRIG 104 08/15/2019 1550   HDL 60 08/15/2019 1550   CHOLHDL 3.9 08/15/2019 1550   LDLCALC 157 (H) 08/15/2019 1550    Physical Exam:    VS:  There were no vitals taken for this visit.    Wt Readings from Last 3 Encounters:  10/17/19 210 lb 6.4 oz (95.4 kg)  09/22/19 188 lb (85.3 kg)  08/15/19 205 lb (93 kg)    Speaking comfortably on the phone, no audible wheezing In no acute distress Alert and oriented Normal affect Normal speech  ASSESSMENT:    1. Palpitations   2. Coronary artery disease involving native coronary artery of native heart, angina presence unspecified   3. S/P coronary angioplasty   4. Family history of heart disease    PLAN:    Palpitations: -returned Zio monitor, we will follow up on the results of this -discussed options for further management. She would like to get results of Zio first, but she is amenable to increasing metoprolol, especially around her menses, to see if this helps -will check thyroid studies, CBC, BMET to rule out other causes. She prefers to have these drawn at [facility]; we will send her lab slips.  History of CAD with prior MI and PCI in [place]:  -no angina -lexiscan without ischemia 06/2019 -continue aspirin 81 mg -on metoprolol and lisinopril. Unclear what her EF was at the time of her MI, records would be helpful. EF on nuclear stress 57% -continue rosuvastatin 20 mg daily. Ordered for recheck lipids/LFTs after start of  statin, not yet completed. Would get these with other labs, as above  Cardiac risk counseling and prevention recommendations: Secondary prevention given known history, also with family history of heart disease. -recommend heart healthy/Mediterranean diet, with whole grains, fruits, vegetable, fish, lean meats, nuts, and olive oil. Limit salt. -recommend moderate walking, 3-5 times/week for 30-50 minutes each session. Aim for at least 150 minutes.week. Goal should be pace of 3 miles/hours, or walking 1.5 miles in 30 minutes -recommend avoidance of tobacco products. Avoid excess alcohol.  Plan for follow up: 1 month to monitor symptoms  Buford Dresser, MD, PhD [place]  [facility]    Medication Adjustments/Labs and Tests Ordered: Current medicines are reviewed at length with the patient today.  Concerns regarding medicines are outlined above.  Orders Placed This Encounter  Procedures  . TSH+T4F+T3Free  . CBC  . Basic metabolic panel   No orders of the defined types were placed in this  encounter.   Patient Instructions  Medication Instructions:  Your Physician recommend you continue on your current medication as directed.    *If you need a refill on your cardiac medications before your next appointment, please call your pharmacy*   Lab Work: Your physician recommends that you return for lab work (TSH, T3, free t4, CBC, BMP)   If you have labs (blood work) drawn today and your tests are completely normal, you will receive your results only by: Marland Kitchen MyChart Message (if you have MyChart) OR . A paper copy in the mail If you have any lab test that is abnormal or we need to change your treatment, we will call you to review the results.   Testing/Procedures: None   Follow-Up: At [facility], you and your health needs are our priority.  As part of our continuing mission to provide you with exceptional heart care, we have created designated Provider Care Teams.  These  Care Teams include your primary Cardiologist (physician) and Advanced Practice Providers (APPs -  Physician Assistants and Nurse Practitioners) who all work together to provide you with the care you need, when you need it.  We recommend signing up for the patient portal called "MyChart".  Sign up information is provided on this After Visit Summary.  MyChart is used to connect with patients for Virtual Visits (Telemedicine).  Patients are able to view lab/test results, encounter notes, upcoming appointments, etc.  Non-urgent messages can be sent to your provider as well.   To learn more about what you can do with MyChart, go to [email or web address].    Your next appointment:   1 month(s)  The format for your next appointment:   Either In Person or Virtual  Provider:   Buford Dresser, MD     Signed, Buford Dresser, MD PhD 12/18/2019     [place]

## 2019-12-25 ENCOUNTER — Telehealth: Payer: Self-pay | Admitting: Cardiology

## 2019-12-25 NOTE — Telephone Encounter (Signed)
Pt updated with monitor report and verbalized understanding.   

## 2019-12-25 NOTE — Telephone Encounter (Signed)
Pt calling to return phone call back regarding her monitor readings. Please return call

## 2019-12-26 ENCOUNTER — Telehealth (INDEPENDENT_AMBULATORY_CARE_PROVIDER_SITE_OTHER): Payer: Self-pay | Admitting: Primary Care

## 2019-12-26 NOTE — Telephone Encounter (Signed)
Patient called to ask if she could get a script for gel that she use to take at the beginning of the year.  Please advise and call patient to discuss at [phone]

## 2019-12-28 LAB — BASIC METABOLIC PANEL
BUN/Creatinine Ratio: 15 (ref 9–23)
BUN: 11 mg/dL (ref 6–24)
CO2: 25 mmol/L (ref 20–29)
Calcium: 9.3 mg/dL (ref 8.7–10.2)
Chloride: 103 mmol/L (ref 96–106)
Creatinine, Ser: 0.74 mg/dL (ref 0.57–1.00)
GFR calc Af Amer: 110 mL/min/{1.73_m2} (ref >59)
GFR calc non Af Amer: 95 mL/min/{1.73_m2} (ref >59)
Glucose: 91 mg/dL (ref 65–99)
Potassium: 4.4 mmol/L (ref 3.5–5.2)
Sodium: 139 mmol/L (ref 134–144)

## 2019-12-28 LAB — CBC
Hematocrit: 36.3 % (ref 34.0–46.6)
Hemoglobin: 12.3 g/dL (ref 11.1–15.9)
MCH: 30.9 pg (ref 26.6–33.0)
MCHC: 33.9 g/dL (ref 31.5–35.7)
MCV: 91 fL (ref 79–97)
Platelets: 393 10*3/uL (ref 150–450)
RBC: 3.98 x10E6/uL (ref 3.77–5.28)
RDW: 12.6 % (ref 11.7–15.4)
WBC: 5.7 10*3/uL (ref 3.4–10.8)

## 2019-12-28 LAB — LIPID PANEL
Chol/HDL Ratio: 5.5 ratio — ABNORMAL HIGH (ref 0.0–4.4)
Cholesterol, Total: 303 mg/dL — ABNORMAL HIGH (ref 100–199)
HDL: 55 mg/dL (ref >39)
LDL Chol Calc (NIH): 193 mg/dL — ABNORMAL HIGH (ref 0–99)
Triglycerides: 283 mg/dL — ABNORMAL HIGH (ref 0–149)
VLDL Cholesterol Cal: 55 mg/dL — ABNORMAL HIGH (ref 5–40)

## 2019-12-28 LAB — HEPATIC FUNCTION PANEL
ALT: 17 IU/L (ref 0–32)
AST: 12 IU/L (ref 0–40)
Albumin: 3.9 g/dL (ref 3.8–4.8)
Alkaline Phosphatase: 82 IU/L (ref 48–121)
Bilirubin Total: 0.4 mg/dL (ref 0.0–1.2)
Bilirubin, Direct: 0.07 mg/dL (ref 0.00–0.40)
Total Protein: 6.6 g/dL (ref 6.0–8.5)

## 2019-12-28 LAB — TSH+T4F+T3FREE
Free T4: 1.14 ng/dL (ref 0.82–1.77)
T3, Free: 2.4 pg/mL (ref 2.0–4.4)
TSH: 1.49 u[IU]/mL (ref 0.450–4.500)

## 2019-12-28 NOTE — Telephone Encounter (Signed)
Attempt to reach patient regarding what gel patient is requesting No answer and LVM.

## 2020-01-12 ENCOUNTER — Telehealth (INDEPENDENT_AMBULATORY_CARE_PROVIDER_SITE_OTHER): Payer: Self-pay | Admitting: Primary Care

## 2020-01-12 NOTE — Telephone Encounter (Signed)
Pt called in to request a Rx for bacteria vaginosis. Pt says that she get it frequently and provider sends in a Rx to pharmacy for her   Please assist.   Pharmacy:  [id number] [place], [place] - [address] AT [facility] OF [address] Phone:  [phone]  Fax:  [phone]

## 2020-01-16 NOTE — Telephone Encounter (Signed)
Contacted patient and advised that she needed an appointment before a prescription can be sent. She was not ready to schedule appointmetn yet. Needed to check work schedule first and will call back to schedule.

## 2020-01-22 ENCOUNTER — Telehealth: Payer: Self-pay | Admitting: Cardiology

## 2020-01-22 NOTE — Telephone Encounter (Signed)
    Pt said she spoke with Elease Hashimoto today but she forgot to ask her a question. She would like for Alisha to call her back

## 2020-01-22 NOTE — Telephone Encounter (Signed)
Left message to call back  

## 2020-01-24 ENCOUNTER — Other Ambulatory Visit: Payer: Self-pay

## 2020-01-24 ENCOUNTER — Ambulatory Visit (INDEPENDENT_AMBULATORY_CARE_PROVIDER_SITE_OTHER): Payer: Managed Care, Other (non HMO) | Admitting: Cardiology

## 2020-01-24 ENCOUNTER — Encounter: Payer: Self-pay | Admitting: Cardiology

## 2020-01-24 VITALS — BP 120/86 | HR 73 | Ht 65.0 in | Wt 216.8 lb

## 2020-01-24 DIAGNOSIS — R002 Palpitations: Secondary | ICD-10-CM

## 2020-01-24 DIAGNOSIS — Z7189 Other specified counseling: Secondary | ICD-10-CM

## 2020-01-24 DIAGNOSIS — I2510 Atherosclerotic heart disease of native coronary artery without angina pectoris: Secondary | ICD-10-CM

## 2020-01-24 DIAGNOSIS — Z79899 Other long term (current) drug therapy: Secondary | ICD-10-CM

## 2020-01-24 DIAGNOSIS — E782 Mixed hyperlipidemia: Secondary | ICD-10-CM | POA: Diagnosis not present

## 2020-01-24 MED ORDER — ATORVASTATIN CALCIUM 20 MG PO TABS: 20.0000 mg | ORAL_TABLET | Freq: Every day | ORAL | 3 refills | Status: DC

## 2020-01-24 NOTE — Telephone Encounter (Signed)
Questions and concerns addressed at office visit on 01/24/20 with Dr. Cristal Deer.

## 2020-01-24 NOTE — Progress Notes (Signed)
Cardiology Office Note:    Date:  01/24/2020   ID:  [name], DOB Feb 14, 1971, MRN [id number]  PCP:  [name], NP  Cardiologist:  Buford Dresser, MD  Referring MD: [name], NP   CC: follow up  History of Present Illness:    Kristina Krause is a 49 y.o. female with a hx of CAD s/p PCI 2016 (in [place]--no records), DVT during pregnancy in remote past, panic attacks who is seen for follow up. I initially met her 06/12/19 via televisit for chest pain.  Cardiac history: MI treated in 2016 at [facility] in [place] (now [place]. [place]). Had [place] 06/2019 for atypical chest pain--low risk, normal EF, small scar but no ischemia.   Today; Still having palpitations once in a while. Gained weight with the pandemic, working on cutting out carbs and sugar. Walking more.  Denies chest pain, shortness of breath at rest or with normal exertion. No PND, orthopnea, LE edema or unexpected weight gain. No syncope or palpitations.  Past Medical History:  Diagnosis Date  . DVT (deep vein thrombosis) in pregnancy   . Myocardial infarct [facility])     Past Surgical History:  Procedure Laterality Date  . CORONARY STENT PLACEMENT  05/2015    Current Medications: Current Outpatient Medications on File Prior to Visit  Medication Sig  . aspirin EC 81 MG tablet Take 1 tablet (81 mg total) by mouth daily.  Marland Kitchen lisinopril (ZESTRIL) 2.5 MG tablet Take 1 tablet (2.5 mg total) by mouth daily.  . metoprolol succinate (TOPROL-XL) 25 MG 24 hr tablet Take 1 tablet (25 mg total) by mouth daily.   No current facility-administered medications on file prior to visit.     Allergies:   Aspirin   Social History   Tobacco Use  . Smoking status: Former Smoker    Types: Cigarettes  . Smokeless tobacco: Never Used  Substance Use Topics  . Alcohol use: Yes    Comment: occ  . Drug use: Not on file    Family History: Mother and father each had MI later in life. Father died age 86, mother is  living in her 62s  ROS:   Please see the history of present illness.  Additional pertinent ROS otherwise unremarkable    EKGs/Labs/Other Studies Reviewed:    The following studies were reviewed today: Zio 2020-01-13 ~3.5 days of data recorded on Zio monitor. Patient had a min HR of 47 bpm, max HR of 138 bpm, and avg HR of 71 bpm. Predominant underlying rhythm was Sinus Rhythm. No SVT, atrial fibrillation, high degree block, or pauses noted. There was one 6 beat run of an unclear rhythm; does not appear consistent with VT (as per label) but possibly SVT with aberrancy.  Isolated atrial and ventricular ectopy was rare (<1%). There were 8 triggered events, which were sinus rhythm. Some of these events had single beats of ectopy (either PAC or PVC). No significant arrhythmias detected.  Nuclear stress 06/29/19  The left ventricular ejection fraction is normal (55-65%).  Nuclear stress EF: 57%. Mild hypokinesis of the septal wall region  There was no ST segment deviation noted during stress.  Findings consistent with prior myocardial infarction.  This is an intermediate risk study based upon prior anteroseptal infarct pattern. However, there is no ischemia identified.  EKG:  EKG is personally reviewed.  The ekg ordered today demonstrates NSR, lHR 73 bpm  Recent Labs: 12/25/2019: ALT 17; BUN 11; Creatinine, Ser 0.74; Hemoglobin 12.3; Platelets 393; Potassium  4.4; Sodium 139; TSH 1.490  Recent Lipid Panel    Component Value Date/Time   CHOL 303 (H) 12/25/2019 1548   TRIG 283 (H) 12/25/2019 1548   HDL 55 12/25/2019 1548   CHOLHDL 5.5 (H) 12/25/2019 1548   LDLCALC 193 (H) 12/25/2019 1548    Physical Exam:    VS:  BP 120/86   Pulse 73   Ht _0  (1.651 m)   Wt 216 lb 12.8 oz (98.3 kg)   LMP 01/03/2020   SpO2 99%   BMI 36.08 kg/m     Wt Readings from Last 3 Encounters:  01/24/20 216 lb 12.8 oz (98.3 kg)  10/17/19 210 lb 6.4 oz (95.4 kg)  09/22/19 188 lb (85.3 kg)    GEN: Well  nourished, well developed in no acute distress HEENT: Normal, moist mucous membranes NECK: No JVD CARDIAC: regular rhythm, normal S1 and S2, no rubs or gallops. No murmur. VASCULAR: Radial and DP pulses 2+ bilaterally. No carotid bruits RESPIRATORY:  Clear to auscultation without rales, wheezing or rhonchi  ABDOMEN: Soft, non-tender, non-distended MUSCULOSKELETAL:  Ambulates independently SKIN: Warm and dry, no edema NEUROLOGIC:  Alert and oriented x 3. No focal neuro deficits noted. PSYCHIATRIC:  Normal affect   ASSESSMENT:    1. Coronary artery disease involving native coronary artery of native heart without angina pectoris   2. Medication management   3. Palpitations   4. Mixed hyperlipidemia   5. Counseling on health promotion and disease prevention    PLAN:    Palpitations: -reviewed monitor, above -no significant arrhythmias, reassuring  History of CAD with prior MI and PCI in [place]:  -no angina -lexiscan without ischemia 06/2019 -continue aspirin 81 mg -on metoprolol and lisinopril. Unclear what her EF was at the time of her MI, records would be helpful. EF on nuclear stress 57% -statin as below -counseled on red flag warning signs that need immediate medical attention  Mixed hyperlipidemia -reviewed recent numbers, was not on rousvastatin at the time -willing to try atorvastatin 20 mg  -recheck lipids once on statin consistently  Cardiac risk counseling and prevention recommendations: Secondary prevention given known history, also with family history of heart disease. -recommend heart healthy/Mediterranean diet, with whole grains, fruits, vegetable, fish, lean meats, nuts, and olive oil. Limit salt. -recommend moderate walking, 3-5 times/week for 30-50 minutes each session. Aim for at least 150 minutes.week. Goal should be pace of 3 miles/hours, or walking 1.5 miles in 30 minutes -recommend avoidance of tobacco products. Avoid excess alcohol.  Plan for follow  up: 3 mos or sooner as needed  Buford Dresser, MD, PhD [place]  [facility]    Medication Adjustments/Labs and Tests Ordered: Current medicines are reviewed at length with the patient today.  Concerns regarding medicines are outlined above.  Orders Placed This Encounter  Procedures  . Lipid panel  . EKG 12-Lead   Meds ordered this encounter  Medications  . atorvastatin (LIPITOR) 20 MG tablet    Sig: Take 1 tablet (20 mg total) by mouth daily.    Dispense:  90 tablet    Refill:  3    Patient Instructions  Medication Instructions:  Restart Atorvastatin 20 mg daily  *If you need a refill on your cardiac medications before your next appointment, please call your pharmacy*   Lab Work: Your physician recommends that you return for lab work prior to your 3 month appointment.  If you have labs (blood work) drawn today and your tests are completely  normal, you will receive your results only by: Marland Kitchen MyChart Message (if you have MyChart) OR . A paper copy in the mail If you have any lab test that is abnormal or we need to change your treatment, we will call you to review the results.   Testing/Procedures: None   Follow-Up: At [facility], you and your health needs are our priority.  As part of our continuing mission to provide you with exceptional heart care, we have created designated Provider Care Teams.  These Care Teams include your primary Cardiologist (physician) and Advanced Practice Providers (APPs -  Physician Assistants and Nurse Practitioners) who all work together to provide you with the care you need, when you need it.  We recommend signing up for the patient portal called "MyChart".  Sign up information is provided on this After Visit Summary.  MyChart is used to connect with patients for Virtual Visits (Telemedicine).  Patients are able to view lab/test results, encounter notes, upcoming appointments, etc.  Non-urgent messages can be sent to your  provider as well.   To learn more about what you can do with MyChart, go to [email or web address].    Your next appointment:   3 month(s)  The format for your next appointment:   In Person  Provider:   Buford Dresser, MD       Signed, Buford Dresser, MD PhD 01/24/2020     [place]

## 2020-01-24 NOTE — Patient Instructions (Signed)
Medication Instructions:  Restart Atorvastatin 20 mg daily  *If you need a refill on your cardiac medications before your next appointment, please call your pharmacy*   Lab Work: Your physician recommends that you return for lab work prior to your 3 month appointment.  If you have labs (blood work) drawn today and your tests are completely normal, you will receive your results only by: Marland Kitchen MyChart Message (if you have MyChart) OR . A paper copy in the mail If you have any lab test that is abnormal or we need to change your treatment, we will call you to review the results.   Testing/Procedures: None   Follow-Up: At [facility], you and your health needs are our priority.  As part of our continuing mission to provide you with exceptional heart care, we have created designated Provider Care Teams.  These Care Teams include your primary Cardiologist (physician) and Advanced Practice Providers (APPs -  Physician Assistants and Nurse Practitioners) who all work together to provide you with the care you need, when you need it.  We recommend signing up for the patient portal called "MyChart".  Sign up information is provided on this After Visit Summary.  MyChart is used to connect with patients for Virtual Visits (Telemedicine).  Patients are able to view lab/test results, encounter notes, upcoming appointments, etc.  Non-urgent messages can be sent to your provider as well.   To learn more about what you can do with MyChart, go to [email or web address].    Your next appointment:   3 month(s)  The format for your next appointment:   In Person  Provider:   Jodelle Red, MD

## 2020-02-05 ENCOUNTER — Other Ambulatory Visit: Payer: Self-pay

## 2020-02-05 ENCOUNTER — Encounter: Payer: Self-pay | Admitting: Emergency Medicine

## 2020-02-05 ENCOUNTER — Emergency Department (INDEPENDENT_AMBULATORY_CARE_PROVIDER_SITE_OTHER)
Admission: EM | Admit: 2020-02-05 | Discharge: 2020-02-05 | Disposition: A | Discharge status: Home / Self Care | Payer: Managed Care, Other (non HMO) | Source: Home / Self Care

## 2020-02-05 DIAGNOSIS — R002 Palpitations: Secondary | ICD-10-CM

## 2020-02-05 DIAGNOSIS — R0789 Other chest pain: Secondary | ICD-10-CM | POA: Diagnosis not present

## 2020-02-05 NOTE — Discharge Instructions (Signed)
  Be sure to follow up with your cardiologist later this week to discuss your concerns about new medication. It is also important to establish care with a primary care provider for ongoing healthcare needs and further evaluation of symptoms if they persist.   Call 911 or have someone drive you to the hospital if symptoms significantly worsening.

## 2020-02-05 NOTE — ED Provider Notes (Signed)
[facility]    CSN: [id number] Arrival date & time: 02/05/20  7829      History   Chief Complaint Chief Complaint  Patient presents with  . Chest Pain    HPI Kristina Krause is a 49 y.o. female.   HPI Kristina Krause is a 49 y.o. female presenting to UC with c/o chest pain/pressure/discomfort since yesterday afternoon. Sensation has been persistent, difficult sensation for pt to describe.  Pt reports her cardiologist starting her on Lipitor about 2 weeks ago for elevated cholesterol, pt concerned that could be contributing to her symptoms, she did not take that medication today. She has had a mild intermittent cough last week. Denies congestion. No SOB. No HA, dizziness, fever, chills, n/v/d. No sick contacts.  Pt had a nuclear Echo in March 2021, was advised it looked normal.   Past Medical History:  Diagnosis Date  . DVT (deep vein thrombosis) in pregnancy   . Myocardial infarct [facility])     Patient Active Problem List   Diagnosis Date Noted  . S/P coronary angioplasty 08/15/2019  . CAD (coronary artery disease) 06/22/2019    Past Surgical History:  Procedure Laterality Date  . CORONARY STENT PLACEMENT  05/2015    OB History   No obstetric history on file.      Home Medications    Prior to Admission medications   Medication Sig Start Date End Date Taking? Authorizing Provider  aspirin EC 81 MG tablet Take 1 tablet (81 mg total) by mouth daily. 10/17/19   Jodelle Red, MD  atorvastatin (LIPITOR) 20 MG tablet Take 1 tablet (20 mg total) by mouth daily. 01/24/20 04/23/20  Jodelle Red, MD  lisinopril (ZESTRIL) 2.5 MG tablet Take 1 tablet (2.5 mg total) by mouth daily. 10/06/19   Jodelle Red, MD  metoprolol succinate (TOPROL-XL) 25 MG 24 hr tablet Take 1 tablet (25 mg total) by mouth daily. 10/10/19   Jodelle Red, MD    Family History Family History  Problem Relation Age of Onset  . Heart attack Mother   . Diabetes  Mother   . Kidney failure Mother   . Hypertension Mother   . Heart attack Father     Social History Social History   Tobacco Use  . Smoking status: Former Smoker    Types: Cigarettes  . Smokeless tobacco: Never Used  Vaping Use  . Vaping Use: Never used  Substance Use Topics  . Alcohol use: Yes    Comment: occ  . Drug use: Not on file     Allergies   Aspirin   Review of Systems Review of Systems  Constitutional: Negative for chills and fever.  HENT: Negative for congestion, ear pain, sore throat, trouble swallowing and voice change.   Respiratory: Positive for cough. Negative for shortness of breath.   Cardiovascular: Positive for chest pain and palpitations.  Gastrointestinal: Negative for abdominal pain, diarrhea, nausea and vomiting.  Musculoskeletal: Negative for arthralgias, back pain and myalgias.  Skin: Negative for rash.  Neurological: Negative for dizziness, syncope, light-headedness and headaches.  All other systems reviewed and are negative.    Physical Exam Triage Vital Signs ED Triage Vitals  Enc Vitals Group     BP 02/05/20 0946 123/74     Pulse Rate 02/05/20 0946 67     Resp --      Temp 02/05/20 0946 97.9 F (36.6 C)     Temp Source 02/05/20 0946 Oral     SpO2 02/05/20 0946 99 %  Weight --      Height --      Head Circumference --      Peak Flow --      Pain Score 02/05/20 0947 5     Pain Loc --      Pain Edu? --      Excl. in GC? --    No data found.  Updated Vital Signs BP 123/74 (BP Location: Right Arm)   Pulse 67   Temp 97.9 F (36.6 C) (Oral)   Ht 5\' 5"  (1.651 m)   Wt 209 lb (94.8 kg)   LMP 01/01/2020 (Approximate)   SpO2 99%   BMI 34.78 kg/m   Visual Acuity Right Eye Distance:   Left Eye Distance:   Bilateral Distance:    Right Eye Near:   Left Eye Near:    Bilateral Near:     Physical Exam Vitals and nursing note reviewed.  Constitutional:      General: She is not in acute distress.    Appearance: She is  well-developed. She is not ill-appearing, toxic-appearing or diaphoretic.     Comments: Pt sitting comfortably on exam bed, reading a book. NAD  HENT:     Head: Normocephalic and atraumatic.  Cardiovascular:     Rate and Rhythm: Normal rate and regular rhythm.  Pulmonary:     Effort: Pulmonary effort is normal.     Breath sounds: No decreased breath sounds, wheezing, rhonchi or rales.  Chest:     Chest wall: No mass, tenderness or crepitus.  Musculoskeletal:        General: Normal range of motion.     Cervical back: Normal range of motion and neck supple.  Skin:    General: Skin is warm and dry.  Neurological:     Mental Status: She is alert and oriented to person, place, and time.  Psychiatric:        Behavior: Behavior normal.      UC Treatments / Results  Labs (all labs ordered are listed, but only abnormal results are displayed) Labs Reviewed - No data to display  EKG Date/Time:02/05/2020   09:38:18 Ventricular Rate: 73 PR Interval: 146 QRS Duration: 74 QT Interval: 390 QTC Calculation: 429 P-R-T axes: 38    11    44 Text Interpretation: Normal sinus rhythm. Septal infarct, age undetermined. Abnormal ECG  No significant change since prior, 01/24/2020 performed by her cardiologist Dr. 01/26/2020, in office for routine f/u  Radiology No results found.  Procedures Procedures (including critical care time)  Medications Ordered in UC Medications - No data to display  Initial Impression / Assessment and Plan / UC Course  I have reviewed the triage vital signs and the nursing notes.  Pertinent labs & imaging results that were available during my care of the patient were reviewed by me and considered in my medical decision making (see chart for details).     Reassure pt of no change with EKG Encouraged f/u with cardiologist to discuss concerns about new medication possibly contributing to her symptoms. Discussed symptoms that warrant emergent  care in the ED. AVS given  Final Clinical Impressions(s) / UC Diagnoses   Final diagnoses:  Atypical chest pain  Palpitations     Discharge Instructions      Be sure to follow up with your cardiologist later this week to discuss your concerns about new medication. It is also important to establish care with a primary care provider for ongoing healthcare needs and further  evaluation of symptoms if they persist.   Call 911 or have someone drive you to the hospital if symptoms significantly worsening.      ED Prescriptions    None     PDMP not reviewed this encounter.   Lurene Shadow, [place] 02/05/20 1109

## 2020-02-05 NOTE — ED Triage Notes (Signed)
Chest pain started yesterday, HX MI

## 2020-02-06 ENCOUNTER — Telehealth: Payer: Self-pay | Admitting: Cardiology

## 2020-02-06 DIAGNOSIS — E7800 Pure hypercholesterolemia, unspecified: Secondary | ICD-10-CM

## 2020-02-06 NOTE — Telephone Encounter (Signed)
Patient states she went to Urgent Care yesterday for chest discomfort and she would like to discuss this with Dr. Cristal Deer. She states she has some questions about some medication.

## 2020-02-06 NOTE — Telephone Encounter (Signed)
LVM2CB 8/31 °

## 2020-02-06 NOTE — Telephone Encounter (Signed)
Called and spoke with pt, she states that she was recently placed on lipitor by Dr.Christopher and she has had issues with this medication. She states this wasn't about "panic" this was different. She states she would like her labs rechecked to see if she really needs to be on the statin medication.  She reports that she was in the store a few days ago and started feeling funny and thought she was going to pass out. She states she ate something to see if it would help and it did some. She then states she would bend over and hae chest pressure and this made her nervous so she went to urgent care to be evaluate. Pt was told her EKG was fine. She reports holding the lipitor yesterday. Pt states she took the medication again today and she felt dizzy again.   She reports that she had been on crestor before and this gave her SOB and pain in her back. She states she had been trying to control her cholesterol on her own and lose weight. But the day she came in for her lab work she was at a party that day and had eaten cake and came to have her labs drawn after eating so she was not fasting.  Again pt would like to have her labs redrawn while truly fasting to see if she can just control her cholesterol on her own.   Notified I would send this message to Dr.Christopher to review and see if we can repeat labs and if she had any advice with her lipitor. Pt verbalized understanding with no other questions at this time.

## 2020-02-07 NOTE — Telephone Encounter (Signed)
Left message to call back  

## 2020-02-07 NOTE — Telephone Encounter (Signed)
Her cholesterol is very high--her LDL is >190, which is consistent with possible genetic high cholesterol. If she would like to talk about other options, especially non-statin medications, we can refer her to Dr. Blanchie Dessert lipid clinic to discuss options. I would hold the lipitor for now if she feels poorly on it. Thanks.

## 2020-02-08 NOTE — Telephone Encounter (Signed)
Nurse answered the call

## 2020-02-08 NOTE — Telephone Encounter (Signed)
Left message to call back  

## 2020-02-16 NOTE — Telephone Encounter (Signed)
Pt updated and verbalized understanding. Referral placed and message sent to scheduling.

## 2020-02-16 NOTE — Addendum Note (Signed)
Addended by: Parke Poisson on: 02/16/2020 09:44 AM   Modules accepted: Orders

## 2020-02-28 ENCOUNTER — Ambulatory Visit (HOSPITAL_COMMUNITY)
Admission: EM | Admit: 2020-02-28 | Discharge: 2020-02-28 | Disposition: A | Discharge status: Home / Self Care | Payer: Managed Care, Other (non HMO) | Attending: Emergency Medicine | Admitting: Emergency Medicine

## 2020-02-28 ENCOUNTER — Other Ambulatory Visit: Payer: Self-pay

## 2020-02-28 ENCOUNTER — Encounter (HOSPITAL_COMMUNITY): Payer: Self-pay | Admitting: *Deleted

## 2020-02-28 ENCOUNTER — Ambulatory Visit (INDEPENDENT_AMBULATORY_CARE_PROVIDER_SITE_OTHER): Payer: Managed Care, Other (non HMO)

## 2020-02-28 DIAGNOSIS — Z79899 Other long term (current) drug therapy: Secondary | ICD-10-CM | POA: Diagnosis not present

## 2020-02-28 DIAGNOSIS — Z86718 Personal history of other venous thrombosis and embolism: Secondary | ICD-10-CM | POA: Insufficient documentation

## 2020-02-28 DIAGNOSIS — U071 COVID-19: Secondary | ICD-10-CM | POA: Diagnosis not present

## 2020-02-28 DIAGNOSIS — Z87891 Personal history of nicotine dependence: Secondary | ICD-10-CM | POA: Diagnosis not present

## 2020-02-28 DIAGNOSIS — R0602 Shortness of breath: Secondary | ICD-10-CM | POA: Diagnosis not present

## 2020-02-28 DIAGNOSIS — Z7982 Long term (current) use of aspirin: Secondary | ICD-10-CM | POA: Insufficient documentation

## 2020-02-28 DIAGNOSIS — Z8249 Family history of ischemic heart disease and other diseases of the circulatory system: Secondary | ICD-10-CM | POA: Insufficient documentation

## 2020-02-28 DIAGNOSIS — R05 Cough: Secondary | ICD-10-CM | POA: Diagnosis not present

## 2020-02-28 DIAGNOSIS — I252 Old myocardial infarction: Secondary | ICD-10-CM | POA: Diagnosis not present

## 2020-02-28 DIAGNOSIS — I2510 Atherosclerotic heart disease of native coronary artery without angina pectoris: Secondary | ICD-10-CM | POA: Diagnosis not present

## 2020-02-28 DIAGNOSIS — Z955 Presence of coronary angioplasty implant and graft: Secondary | ICD-10-CM | POA: Diagnosis not present

## 2020-02-28 DIAGNOSIS — R059 Cough, unspecified: Secondary | ICD-10-CM

## 2020-02-28 LAB — SARS CORONAVIRUS 2 (TAT 6-24 HRS): SARS Coronavirus 2: POSITIVE — AB

## 2020-02-28 MED ORDER — BENZONATATE 100 MG PO CAPS: 100.0000 mg | ORAL_CAPSULE | Freq: Three times a day (TID) | ORAL | 0 refills | Status: DC

## 2020-02-28 NOTE — ED Notes (Signed)
Attempted to call patient on phone, no answer. Left message on voicemail to return call.

## 2020-02-28 NOTE — ED Triage Notes (Signed)
Patient in with complaints of cough, headache, SOB and chills since Sunday. Patient states she took tylenol and tylenol cold and flu last night. Patient has not checked temperature at home.

## 2020-02-28 NOTE — Discharge Instructions (Signed)
Your chest xray is clear today.  Self isolate until covid results are back and negative.  Will notify you by phone of any positive findings. Your negative results will be sent through your MyChart.     Tessalon as needed for cough.  Plenty of fluids.  If symptoms worsen or do not improve in the next week to return to be seen or to follow up with your PCP.

## 2020-02-28 NOTE — ED Provider Notes (Signed)
[facility]    CSN: [id number] Arrival date & time: 02/28/20  1102      History   Chief Complaint Chief Complaint  Patient presents with  . Headache  . Shortness of Breath  . Chills    HPI Kristina Krause is a 49 y.o. female.   Kristina Krause presents with complaints of symptoms which started 2-3 days ago. Some pain with deep breathing. Followed by chills, headache, body aches. Today some shortness of breath and cough. Cough is dry but maybe a bit more loose this afternoon. Has had MI and stent in the past. No chest pain. Doesn't smoke. No leg pain or swelling. Some nasal drip. No sore throat. Felt chills but no specific known fever. Yesterday took tylenol flu, doesn't seem to have helped outside of headache. Helped her sleep last night. No change to baseline swelling to left lower extremity. 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  No other known exposures. No history of covid-19 and has not received vaccination.    ROS per HPI, negative if not otherwise mentioned.       Past Medical History:  Diagnosis Date  . DVT (deep vein thrombosis) in pregnancy   . Myocardial infarct [facility])     Patient Active Problem List   Diagnosis Date Noted  . S/P coronary angioplasty 08/15/2019  . CAD (coronary artery disease) 06/22/2019    Past Surgical History:  Procedure Laterality Date  . CORONARY STENT PLACEMENT  05/2015    OB History   No obstetric history on file.      Home Medications    Prior to Admission medications   Medication Sig Start Date End Date Taking? Authorizing Provider  aspirin EC 81 MG tablet Take 1 tablet (81 mg total) by mouth daily. 10/17/19  Yes Jodelle Red, MD  lisinopril (ZESTRIL) 2.5 MG tablet Take 1 tablet (2.5 mg total) by mouth daily. 10/06/19  Yes Jodelle Red, MD  metoprolol succinate (TOPROL-XL) 25 MG 24 hr tablet Take 1 tablet (25 mg total) by mouth daily. 10/10/19  Yes  Jodelle Red, MD  atorvastatin (LIPITOR) 20 MG tablet Take 1 tablet (20 mg total) by mouth daily. 01/24/20 04/23/20  Jodelle Red, MD  benzonatate (TESSALON) 100 MG capsule Take 1 capsule (100 mg total) by mouth every 8 (eight) hours. 02/28/20   Georgetta Haber, NP    Family History Family History  Problem Relation Age of Onset  . Heart attack Mother   . Diabetes Mother   . Kidney failure Mother   . Hypertension Mother   . Heart attack Father     Social History Social History   Tobacco Use  . Smoking status: Former Smoker    Types: Cigarettes  . Smokeless tobacco: Never Used  Vaping Use  . Vaping Use: Never used  Substance Use Topics  . Alcohol use: Yes    Comment: occ  . Drug use: Not on file     Allergies   Aspirin   Review of Systems Review of Systems   Physical Exam Triage Vital Signs ED Triage Vitals  Enc Vitals Group     BP 02/28/20 1318 121/67     Pulse Rate 02/28/20 1318 92     Resp 02/28/20 1318 18     Temp 02/28/20 1318 99.3 F (37.4 C)     Temp Source 02/28/20 1318 Oral     SpO2 02/28/20 1318 97 %  Weight --      Height --      Head Circumference --      Peak Flow --      Pain Score 02/28/20 1322 0     Pain Loc --      Pain Edu? --      Excl. in GC? --    No data found.  Updated Vital Signs BP 121/67 (BP Location: Left Arm)   Pulse 92   Temp 99.3 F (37.4 C) (Oral)   Resp 18   SpO2 97%   Visual Acuity Right Eye Distance:   Left Eye Distance:   Bilateral Distance:    Right Eye Near:   Left Eye Near:    Bilateral Near:     Physical Exam Constitutional:      General: She is not in acute distress.    Appearance: She is well-developed.  Cardiovascular:     Rate and Rhythm: Normal rate.     Comments: Baseline LLE edema  Pulmonary:     Effort: Pulmonary effort is normal.  Skin:    General: Skin is warm and dry.  Neurological:     Mental Status: She is alert and oriented to person, place, and time.       UC Treatments / Results  Labs (all labs ordered are listed, but only abnormal results are displayed) Labs Reviewed  SARS CORONAVIRUS 2 (TAT 6-24 HRS)    EKG   Radiology DG Chest 2 View  Result Date: 02/28/2020 CLINICAL DATA:  Productive cough, shortness of breath. EXAM: CHEST - 2 VIEW COMPARISON:  August 14, 2019. FINDINGS: The heart size and mediastinal contours are within normal limits. Both lungs are clear. No pneumothorax or pleural effusion is noted. The visualized skeletal structures are unremarkable. IMPRESSION: No active cardiopulmonary disease. Electronically Signed   By: Lupita Raider M.D.   On: 02/28/2020 14:59    Procedures Procedures (including critical care time)  Medications Ordered in UC Medications - No data to display  Initial Impression / Assessment and Plan / UC Course  I have reviewed the triage vital signs and the nursing notes.  Pertinent labs & imaging results that were available during my care of the patient were reviewed by me and considered in my medical decision making (see chart for details).     Non toxic. Benign physical exam.  Normal chest xray today. Covid testing pending and isolation instructions provided.  Cough. Return precautions provided. Patient verbalized understanding and agreeable to plan.   Final Clinical Impressions(s) / UC Diagnoses   Final diagnoses:  Cough     Discharge Instructions     Your chest xray is clear today.  Self isolate until covid results are back and negative.  Will notify you by phone of any positive findings. Your negative results will be sent through your MyChart.     Tessalon as needed for cough.  Plenty of fluids.  If symptoms worsen or do not improve in the next week to return to be seen or to follow up with your PCP.      ED Prescriptions    Medication Sig Dispense Auth. Provider   benzonatate (TESSALON) 100 MG capsule Take 1 capsule (100 mg total) by mouth every 8 (eight) hours. 21 capsule  Georgetta Haber, NP     PDMP not reviewed this encounter.   Georgetta Haber, NP 02/28/20 1931

## 2020-02-29 ENCOUNTER — Telehealth: Payer: Self-pay | Admitting: Family

## 2020-02-29 ENCOUNTER — Other Ambulatory Visit: Payer: Self-pay | Admitting: Family

## 2020-02-29 DIAGNOSIS — U071 COVID-19: Secondary | ICD-10-CM

## 2020-02-29 DIAGNOSIS — I2510 Atherosclerotic heart disease of native coronary artery without angina pectoris: Secondary | ICD-10-CM

## 2020-02-29 DIAGNOSIS — Z609 Problem related to social environment, unspecified: Secondary | ICD-10-CM

## 2020-02-29 NOTE — Progress Notes (Signed)
I connected by phone with Kristina Krause on 02/29/2020 at 4:22 PM to discuss the potential use of a new treatment for mild to moderate COVID-19 viral infection in non-hospitalized patients.  This patient is a 49 y.o. female that meets the FDA criteria for Emergency Use Authorization of COVID monoclonal antibody casirivimab/imdevimab.  Has a (+) direct SARS-CoV-2 viral test result  Has mild or moderate COVID-19   Is NOT hospitalized due to COVID-19  Is within 10 days of symptom onset  Has at least one of the high risk factor(s) for progression to severe COVID-19 and/or hospitalization as defined in EUA.  Specific high risk criteria : Cardiovascular disease or hypertension and Other high risk medical condition per CDC:  High SVI   I have spoken and communicated the following to the patient or parent/caregiver regarding COVID monoclonal antibody treatment:  1. FDA has authorized the emergency use for the treatment of mild to moderate COVID-19 in adults and pediatric patients with positive results of direct SARS-CoV-2 viral testing who are 93 years of age and older weighing at least 40 kg, and who are at high risk for progressing to severe COVID-19 and/or hospitalization.  2. The significant known and potential risks and benefits of COVID monoclonal antibody, and the extent to which such potential risks and benefits are unknown.  3. Information on available alternative treatments and the risks and benefits of those alternatives, including clinical trials.  4. Patients treated with COVID monoclonal antibody should continue to self-isolate and use infection control measures (e.g., wear mask, isolate, social distance, avoid sharing personal items, clean and disinfect "high touch" surfaces, and frequent handwashing) according to CDC guidelines.   5. The patient or parent/caregiver has the option to accept or refuse COVID monoclonal antibody treatment.  After reviewing this information with the  patient, The patient agreed to proceed with receiving casirivimab\imdevimab infusion and will be provided a copy of the Fact sheet prior to receiving the infusion.   [name], NP 02/29/2020 4:22 PM

## 2020-02-29 NOTE — Telephone Encounter (Signed)
Called to Discuss with patient about Covid symptoms and the use of the monoclonal antibody infusion for those with mild to moderate Covid symptoms and at a high risk of hospitalization.     Pt appears to qualify for this infusion due to co-morbid conditions and/or a member of an at-risk group in accordance with the FDA Emergency Use Authorization.   Kristina Krause was seen at the [facility] in [place] on 9/22 with headache, shortness of breath, chills, dry cough and body aches. Symptoms started 02/26/20. Qualifying risk factors include CAD and BMI > 25.   Spoke with Kristina Krause regarding the risks and benefits of treatment with Regeneron and she wishes to proceed. She will be checking with her Cardiologist prior to her appointment.   [name],   You have been scheduled to receive Regeneron (the monoclonal antibody we discussed) on : 03/02/11 at 2:30 pm  If you have been tested outside of a [facility] - you MUST bring a copy of your positive test with you the morning of your appointment. You may take a photo of this and upload to your MyChart portal or have the testing facility fax the result to [phone]    The address for the infusion clinic site is:  --GPS address is [address] - the parking is located near [employer] building where you will see  COVID19 Infusion feather banner marking the entrance to parking.   (see photos below)            --Enter into the 2nd entrance where the "wave, flag banner" is at the road. Turn into this 2nd entrance and immediately turn left to park in 1 of the 5 parking spots.   --Please stay in your car and call the desk for assistance inside [phone].   --Average time in department is roughly 2 hours for Regeneron treatment - this includes preparation of the medication, IV start and the required 1 hour monitoring after the infusion.    Should you develop worsening shortness of breath, chest pain or severe  breathing problems please do not wait for this appointment and go to the Emergency room for evaluation and treatment. You will undergo another oxygen screen before your infusion to ensure this is the best treatment option for you. There is a chance that the best decision may be to send you to the Emergency Room for evaluation at the time of your appointment.   The day of your visit you should: Marland Kitchen Get plenty of rest the night before and drink plenty of water . Eat a light meal/snack before coming and take your medications as prescribed  . Wear warm, comfortable clothes with a shirt that can roll-up over the elbow (will need IV start).  . Wear a mask  . Consider bringing some activity to help pass the time  Many commercial insurers are waiving bills related to COVID treatment however some have ranged from $300-640. We are starting to see some insurers send bills to patients later for the administration of the medication - we are learning more information but you may receive a bill after your appointment.  Please contact your insurance agent to discuss prior to your appointment if you would like further details about billing specific to your policy.    The CPT code is [phone] for your reference.    Marcos Eke, NP 02/29/2020  4:22 PM

## 2020-03-01 ENCOUNTER — Ambulatory Visit (HOSPITAL_COMMUNITY)
Admission: RE | Admit: 2020-03-01 | Discharge: 2020-03-01 | Disposition: A | Discharge status: Home / Self Care | Payer: Managed Care, Other (non HMO) | Source: Ambulatory Visit | Attending: Pulmonary Disease | Admitting: Pulmonary Disease

## 2020-03-01 ENCOUNTER — Telehealth: Payer: Self-pay | Admitting: Cardiology

## 2020-03-01 DIAGNOSIS — Z609 Problem related to social environment, unspecified: Secondary | ICD-10-CM | POA: Diagnosis present

## 2020-03-01 DIAGNOSIS — U071 COVID-19: Secondary | ICD-10-CM

## 2020-03-01 DIAGNOSIS — I2510 Atherosclerotic heart disease of native coronary artery without angina pectoris: Secondary | ICD-10-CM

## 2020-03-01 MED ORDER — DIPHENHYDRAMINE HCL 50 MG/ML IJ SOLN: 50.0000 mg | Freq: Once | INTRAMUSCULAR | Status: DC | PRN

## 2020-03-01 MED ORDER — METHYLPREDNISOLONE SODIUM SUCC 125 MG IJ SOLR: 125.0000 mg | Freq: Once | INTRAMUSCULAR | Status: DC | PRN

## 2020-03-01 MED ORDER — ALBUTEROL SULFATE HFA 108 (90 BASE) MCG/ACT IN AERS: 2.0000 | INHALATION_SPRAY | Freq: Once | RESPIRATORY_TRACT | Status: DC | PRN

## 2020-03-01 MED ORDER — ACETAMINOPHEN 325 MG PO TABS
650.0000 mg | ORAL_TABLET | Freq: Once | ORAL | Status: AC
  Administered 2020-03-01: 650 mg via ORAL
  Filled 2020-03-01: qty 2

## 2020-03-01 MED ORDER — SODIUM CHLORIDE 0.9 % IV SOLN: INTRAVENOUS | Status: DC | PRN

## 2020-03-01 MED ORDER — SODIUM CHLORIDE 0.9 % IV SOLN
1200.0000 mg | Freq: Once | INTRAVENOUS | Status: AC
  Administered 2020-03-01: 1200 mg via INTRAVENOUS

## 2020-03-01 MED ORDER — EPINEPHRINE 0.3 MG/0.3ML IJ SOAJ: 0.3000 mg | Freq: Once | INTRAMUSCULAR | Status: DC | PRN

## 2020-03-01 MED ORDER — FAMOTIDINE IN NACL 20-0.9 MG/50ML-% IV SOLN: 20.0000 mg | Freq: Once | INTRAVENOUS | Status: DC | PRN

## 2020-03-01 NOTE — Telephone Encounter (Signed)
Called patient back about her message. Patient stated she is concerned about getting the infusion, due to her blood type being O Neg. Informed patient that it is recommended to help those who have COVID. Will send message to Dr. Cristal Deer for further advisement. Will also send to DOD, Dr. Duke Salvia for advisement, since infusion is scheduled for later today.

## 2020-03-01 NOTE — Progress Notes (Signed)
  Diagnosis: COVID-19  Physician: Dr. Wright  Procedure: Covid Infusion Clinic Med: casirivimab\imdevimab infusion - Provided patient with casirivimab\imdevimab fact sheet for patients, parents and caregivers prior to infusion.  Complications: No immediate complications noted.  Discharge: Discharged home   Kristina Krause Marie C 03/01/2020  

## 2020-03-01 NOTE — Telephone Encounter (Signed)
Left detailed message, ok per DPR  

## 2020-03-01 NOTE — Telephone Encounter (Signed)
Patient states she tested positive for covid. She is scheduled today for an IV of regeneron and would like to know if it is safe for her.

## 2020-03-01 NOTE — Discharge Instructions (Signed)

## 2020-03-01 NOTE — Telephone Encounter (Signed)
Get the Remdesivir if it was recommended to her

## 2020-03-04 ENCOUNTER — Emergency Department (HOSPITAL_COMMUNITY): Payer: Managed Care, Other (non HMO)

## 2020-03-04 ENCOUNTER — Emergency Department (HOSPITAL_COMMUNITY)
Admission: EM | Admit: 2020-03-04 | Discharge: 2020-03-04 | Disposition: A | Discharge status: Home / Self Care | Payer: Managed Care, Other (non HMO) | Attending: Emergency Medicine | Admitting: Emergency Medicine

## 2020-03-04 ENCOUNTER — Encounter (INDEPENDENT_AMBULATORY_CARE_PROVIDER_SITE_OTHER): Payer: Self-pay | Admitting: Primary Care

## 2020-03-04 ENCOUNTER — Ambulatory Visit (INDEPENDENT_AMBULATORY_CARE_PROVIDER_SITE_OTHER): Payer: Managed Care, Other (non HMO) | Admitting: Primary Care

## 2020-03-04 ENCOUNTER — Other Ambulatory Visit: Payer: Self-pay

## 2020-03-04 ENCOUNTER — Ambulatory Visit: Payer: Self-pay

## 2020-03-04 ENCOUNTER — Encounter (HOSPITAL_COMMUNITY): Payer: Self-pay

## 2020-03-04 DIAGNOSIS — R0602 Shortness of breath: Secondary | ICD-10-CM | POA: Diagnosis present

## 2020-03-04 DIAGNOSIS — U071 COVID-19: Secondary | ICD-10-CM | POA: Insufficient documentation

## 2020-03-04 DIAGNOSIS — Z87891 Personal history of nicotine dependence: Secondary | ICD-10-CM | POA: Insufficient documentation

## 2020-03-04 DIAGNOSIS — Z7982 Long term (current) use of aspirin: Secondary | ICD-10-CM | POA: Diagnosis not present

## 2020-03-04 DIAGNOSIS — Z955 Presence of coronary angioplasty implant and graft: Secondary | ICD-10-CM | POA: Diagnosis not present

## 2020-03-04 DIAGNOSIS — I2510 Atherosclerotic heart disease of native coronary artery without angina pectoris: Secondary | ICD-10-CM | POA: Diagnosis not present

## 2020-03-04 DIAGNOSIS — J1282 Pneumonia due to coronavirus disease 2019: Secondary | ICD-10-CM | POA: Diagnosis not present

## 2020-03-04 LAB — CBC WITH DIFFERENTIAL/PLATELET
Abs Immature Granulocytes: 0 10*3/uL (ref 0.00–0.07)
Basophils Absolute: 0 10*3/uL (ref 0.0–0.1)
Basophils Relative: 0 %
Eosinophils Absolute: 0.1 10*3/uL (ref 0.0–0.5)
Eosinophils Relative: 2 %
HCT: 40.8 % (ref 36.0–46.0)
Hemoglobin: 13.2 g/dL (ref 12.0–15.0)
Lymphocytes Relative: 53 %
Lymphs Abs: 1.7 10*3/uL (ref 0.7–4.0)
MCH: 29.7 pg (ref 26.0–34.0)
MCHC: 32.4 g/dL (ref 30.0–36.0)
MCV: 91.9 fL (ref 80.0–100.0)
Monocytes Absolute: 0.2 10*3/uL (ref 0.1–1.0)
Monocytes Relative: 5 %
Neutro Abs: 1.3 10*3/uL — ABNORMAL LOW (ref 1.7–7.7)
Neutrophils Relative %: 40 %
Platelets: 288 10*3/uL (ref 150–400)
RBC: 4.44 MIL/uL (ref 3.87–5.11)
RDW: 12.4 % (ref 11.5–15.5)
WBC: 3.2 10*3/uL — ABNORMAL LOW (ref 4.0–10.5)
nRBC: 0 % (ref 0.0–0.2)
nRBC: 0 /100 WBC

## 2020-03-04 LAB — BASIC METABOLIC PANEL
Anion gap: 10 (ref 5–15)
BUN: 7 mg/dL (ref 6–20)
CO2: 21 mmol/L — ABNORMAL LOW (ref 22–32)
Calcium: 8.8 mg/dL — ABNORMAL LOW (ref 8.9–10.3)
Chloride: 106 mmol/L (ref 98–111)
Creatinine, Ser: 0.81 mg/dL (ref 0.44–1.00)
GFR calc Af Amer: >60 mL/min (ref >60)
GFR calc non Af Amer: >60 mL/min (ref >60)
Glucose, Bld: 110 mg/dL — ABNORMAL HIGH (ref 70–99)
Potassium: 4.3 mmol/L (ref 3.5–5.1)
Sodium: 137 mmol/L (ref 135–145)

## 2020-03-04 LAB — TROPONIN I (HIGH SENSITIVITY): Troponin I (High Sensitivity): 5 ng/L (ref <18)

## 2020-03-04 MED ORDER — ALBUTEROL SULFATE HFA 108 (90 BASE) MCG/ACT IN AERS: 2.0000 | INHALATION_SPRAY | Freq: Four times a day (QID) | RESPIRATORY_TRACT | 0 refills | Status: DC | PRN

## 2020-03-04 MED ORDER — IOHEXOL 350 MG/ML SOLN
75.0000 mL | Freq: Once | INTRAVENOUS | Status: AC | PRN
  Administered 2020-03-04: 75 mL via INTRAVENOUS

## 2020-03-04 NOTE — ED Triage Notes (Signed)
Pt reports COVID+ on Wed, took infusion on Friday and now having SOB and chest tightness.

## 2020-03-04 NOTE — Progress Notes (Signed)
Pt just left the ED  Wanting to know how to get herself better  She is very worried

## 2020-03-04 NOTE — Telephone Encounter (Signed)
Pt. Reports she has been diagnosed with COVID 19 last week and had antibody infusion. Seen in ED today - diagnosed with pneumonia. Instructed to call her"doctor so she can call some medicine in for me." Request visit. Virtual visit made for today.  Reason for Disposition . [1] Fever > 100.0 F (37.8 C) AND [2] bedridden (e.g., nursing home patient, CVA, chronic illness, recovering from surgery)  Answer Assessment - Initial Assessment Questions 1. COVID-19 DIAGNOSIS: "Who made your Coronavirus (COVID-19) diagnosis?" "Was it confirmed by a positive lab test?" If not diagnosed by a HCP, ask "Are there lots of cases (community spread) where you live?" (See public health department website, if unsure)     ED 2. COVID-19 EXPOSURE: "Was there any known exposure to COVID before the symptoms began?" CDC Definition of close contact: within 6 feet (2 meters) for a total of 15 minutes or more over a 24-hour period.      No 3. ONSET: "When did the COVID-19 symptoms start?"      Last week 4. WORST SYMPTOM: "What is your worst symptom?" (e.g., cough, fever, shortness of breath, muscle aches)     Cough, shortness of breath 5. COUGH: "Do you have a cough?" If Yes, ask: "How bad is the cough?"       Yes 6. FEVER: "Do you have a fever?" If Yes, ask: "What is your temperature, how was it measured, and when did it start?"     No 7. RESPIRATORY STATUS: "Describe your breathing?" (e.g., shortness of breath, wheezing, unable to speak)      SOB 8. BETTER-SAME-WORSE: "Are you getting better, staying the same or getting worse compared to yesterday?"  If getting worse, ask, "In what way?"     Worse 9. HIGH RISK DISEASE: "Do you have any chronic medical problems?" (e.g., asthma, heart or lung disease, weak immune system, obesity, etc.)     Heart 10. PREGNANCY: "Is there any chance you are pregnant?" "When was your last menstrual period?"       No 11. OTHER SYMPTOMS: "Do you have any other symptoms?"  (e.g., chills,  fatigue, headache, loss of smell or taste, muscle pain, sore throat; new loss of smell or taste especially support the diagnosis of COVID-19)       No  Protocols used: CORONAVIRUS (COVID-19) DIAGNOSED OR SUSPECTED-A-AH

## 2020-03-04 NOTE — ED Provider Notes (Addendum)
[facility] EMERGENCY DEPARTMENT Provider Note   CSN: [id number] Arrival date & time: 03/04/20  0056     History Chief Complaint  Patient presents with  . COVID  . Chest Pain    Kristina Krause is a 49 y.o. female.  HPI 49 year old female presents with shortness of breath and chest pain.  She was diagnosed with Covid-19 last week and today is her seventh day of symptoms total.  Got a monoclonal antibody infusion 3 days ago and since then has been feeling worse including worse cough, right-sided thoracic chest/back pain and shortness of breath.  Sputum is productive but no blood.  Some low-grade fevers.  No leg swelling but has prior history of DVT and was recently taken off of her blood thinner.  She also is worried because she is had an MI before.   Past Medical History:  Diagnosis Date  . DVT (deep vein thrombosis) in pregnancy   . Myocardial infarct [facility])     Patient Active Problem List   Diagnosis Date Noted  . S/P coronary angioplasty 08/15/2019  . CAD (coronary artery disease) 06/22/2019    Past Surgical History:  Procedure Laterality Date  . CORONARY STENT PLACEMENT  05/2015     OB History   No obstetric history on file.     Family History  Problem Relation Age of Onset  . Heart attack Mother   . Diabetes Mother   . Kidney failure Mother   . Hypertension Mother   . Heart attack Father     Social History   Tobacco Use  . Smoking status: Former Smoker    Types: Cigarettes  . Smokeless tobacco: Never Used  Vaping Use  . Vaping Use: Never used  Substance Use Topics  . Alcohol use: Yes    Comment: occ  . Drug use: Not on file    Home Medications Prior to Admission medications   Medication Sig Start Date End Date Taking? Authorizing Provider  aspirin EC 81 MG tablet Take 1 tablet (81 mg total) by mouth daily. 10/17/19   Jodelle Red, MD  atorvastatin (LIPITOR) 20 MG tablet Take 1 tablet (20 mg total) by mouth daily.  01/24/20 04/23/20  Jodelle Red, MD  benzonatate (TESSALON) 100 MG capsule Take 1 capsule (100 mg total) by mouth every 8 (eight) hours. 02/28/20   Linus Mako B, NP  lisinopril (ZESTRIL) 2.5 MG tablet Take 1 tablet (2.5 mg total) by mouth daily. 10/06/19   Jodelle Red, MD  metoprolol succinate (TOPROL-XL) 25 MG 24 hr tablet Take 1 tablet (25 mg total) by mouth daily. 10/10/19   Jodelle Red, MD    Allergies    Aspirin  Review of Systems   Review of Systems  Constitutional: Positive for fever.  Respiratory: Positive for cough, chest tightness and shortness of breath.   Cardiovascular: Positive for chest pain. Negative for leg swelling.  Gastrointestinal: Negative for vomiting.  All other systems reviewed and are negative.   Physical Exam Updated Vital Signs BP 117/67   Pulse (!) 53   Temp 98.6 F (37 C) (Oral)   Resp 18   SpO2 100%   Physical Exam Vitals and nursing note reviewed.  Constitutional:      General: She is not in acute distress.    Appearance: She is well-developed. She is not ill-appearing or diaphoretic.  HENT:     Head: Normocephalic and atraumatic.     Right Ear: External ear normal.     Left Ear:  External ear normal.     Nose: Nose normal.  Eyes:     General:        Right eye: No discharge.        Left eye: No discharge.  Cardiovascular:     Rate and Rhythm: Normal rate and regular rhythm.     Heart sounds: Normal heart sounds.  Pulmonary:     Effort: Pulmonary effort is normal.     Breath sounds: Normal breath sounds. No wheezing or rales.  Abdominal:     Palpations: Abdomen is soft.     Tenderness: There is no abdominal tenderness.  Skin:    General: Skin is warm and dry.  Neurological:     Mental Status: She is alert.  Psychiatric:        Mood and Affect: Mood is not anxious.     ED Results / Procedures / Treatments   Labs (all labs ordered are listed, but only abnormal results are displayed) Labs Reviewed    BASIC METABOLIC PANEL - Abnormal; Notable for the following components:      Result Value   CO2 21 (*)    Glucose, Bld 110 (*)    Calcium 8.8 (*)    All other components within normal limits  CBC WITH DIFFERENTIAL/PLATELET - Abnormal; Notable for the following components:   WBC 3.2 (*)    Neutro Abs 1.3 (*)    All other components within normal limits  BASIC METABOLIC PANEL  CBC WITH DIFFERENTIAL/PLATELET  I-STAT BETA HCG BLOOD, ED ([facility], [facility], [facility] ONLY)  TROPONIN I (HIGH SENSITIVITY)  TROPONIN I (HIGH SENSITIVITY)    EKG EKG Interpretation  Date/Time:  Monday March 04 2020 01:10:48 EDT Ventricular Rate:  74 PR Interval:  140 QRS Duration: 72 QT Interval:  382 QTC Calculation: 424 R Axis:   17 Text Interpretation: Normal sinus rhythm Normal ECG When compared with ECG of 10/06/2019, No significant change was found Confirmed by Dione Booze ([id number]) on 03/04/2020 1:21:07 AM   Radiology CT Angio Chest PE W and/or Wo Contrast  Result Date: 03/04/2020 CLINICAL DATA:  Shortness of breath and chest tightness. Reported COVID-19 positive EXAM: CT ANGIOGRAPHY CHEST WITH CONTRAST TECHNIQUE: Multidetector CT imaging of the chest was performed using the standard protocol during bolus administration of intravenous contrast. Multiplanar CT image reconstructions and MIPs were obtained to evaluate the vascular anatomy. CONTRAST:  80mL OMNIPAQUE IOHEXOL 350 MG/ML SOLN COMPARISON:  Chest radiograph March 04, 2020 FINDINGS: Cardiovascular: There is no demonstrable pulmonary embolus. There is no thoracic aortic aneurysm or dissection. The visualized great vessels appear unremarkable. There are foci of coronary artery calcification. There is no pericardial effusion or pericardial thickening. Mediastinum/Nodes: Visualized thyroid appears normal. There is no evident thoracic adenopathy. No esophageal lesions are appreciable. Lungs/Pleura: There are patchy areas of ill-defined airspace opacity bilaterally,  most notably in the right upper lobe. Scattered foci are noted elsewhere in each lower lobe as well as in the lingula. No consolidation noted. No pleural effusions appreciable. 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 Filter better seen on the scout topogram image. Visualized upper abdominal structures otherwise appear unremarkable. Musculoskeletal: Foci of degenerative change noted in the thoracic spine. No blastic or lytic bone lesions. No chest wall lesions evident. Review of the MIP images confirms the above findings. IMPRESSION: 1. No demonstrable pulmonary embolus. No thoracic aortic aneurysm or dissection. 2. Multiple foci of ill-defined airspace opacity, likely due to COVID-19  pneumonia. No consolidation. No pleural effusions. 3.  No evident adenopathy. 4. Inferior vena cava filter present, better seen on the scout topogram. Electronically Signed   By: Bretta Bang III M.D.   On: 03/04/2020 12:45   DG Chest Portable 1 View  Result Date: 03/04/2020 CLINICAL DATA:  COVID-19 positive, antibody effusion on Friday now with shortness of breath and chest tightness EXAM: PORTABLE CHEST 1 VIEW COMPARISON:  Radiograph 02/28/2019 FINDINGS: There is mild airways thickening. No consolidation or convincing features of edema. No pneumothorax or effusion. The cardiomediastinal contours are unremarkable. No acute osseous or soft tissue abnormality. IMPRESSION: Mild airways thickening could reflect some bronchitic change in the setting of COVID-19 infection. No focal consolidative opacity or other significant interval change. Electronically Signed   By: Kreg Shropshire M.D.   On: 03/04/2020 01:35    Procedures Procedures (including critical care time)  Medications Ordered in ED Medications  iohexol (OMNIPAQUE) 350 MG/ML injection 75 mL (75 mLs Intravenous Contrast Given 03/04/20 1235)    ED Course  I have reviewed the triage vital signs and the nursing  notes.  Pertinent labs & imaging results that were available during my care of the patient were reviewed by me and considered in my medical decision making (see chart for details).    MDM Rules/Calculators/A&P                          Given that the patient is having right-sided chest/back pain and her previous thromboembolism history, CT study ordered and is negative for PE.  She does seem to have some pneumonia which goes along with her Covid.  However she is not hypoxic or having increased work of breathing.  She appears stable for discharge home to continue to follow-up with supportive care. I have reviewed her ECG, labs, and CXR/CT images.  Kristina Krause was evaluated in Emergency Department on 03/04/2020 for the symptoms described in the history of present illness. She was evaluated in the context of the global COVID-19 pandemic, which necessitated consideration that the patient might be at risk for infection with the SARS-CoV-2 virus that causes COVID-19. Institutional protocols and algorithms that pertain to the evaluation of patients at risk for COVID-19 are in a state of rapid change based on information released by regulatory bodies including the CDC and federal and state organizations. These policies and algorithms were followed during the patient's care in the ED.  Final Clinical Impression(s) / ED Diagnoses Final diagnoses:  Pneumonia due to COVID-19 virus    Rx / DC Orders ED Discharge Orders    None       Pricilla Loveless, MD 03/04/20 1329    Pricilla Loveless, MD 03/04/20 1330

## 2020-03-04 NOTE — Progress Notes (Signed)
Established Patient Office Visit  Subjective:  Patient ID: Kristina Krause, female    DOB: [birth year]  Age: 49 y.o. MRN: [id number]  CC:  Chief Complaint  Patient presents with  . Covid Positive    with pneumonia     HPI Ms. Kristina Krause is a 49 year old female.  She was diagnosed with COVID on February 28, 2020.She present to and seen in the emergency room earlier today .Questioning to know how to get herself better.She is very worried   Past Medical History:  Diagnosis Date  . DVT (deep vein thrombosis) in pregnancy   . Myocardial infarct [facility])     Past Surgical History:  Procedure Laterality Date  . CORONARY STENT PLACEMENT  05/2015    Family History  Problem Relation Age of Onset  . Heart attack Mother   . Diabetes Mother   . Kidney failure Mother   . Hypertension Mother   . Heart attack Father     Social History   Socioeconomic History  . Marital status: Single    Spouse name: Not on file  . Number of children: Not on file  . Years of education: Not on file  . Highest education level: Not on file  Occupational History  . Not on file  Tobacco Use  . Smoking status: Former Smoker    Types: Cigarettes  . Smokeless tobacco: Never Used  Vaping Use  . Vaping Use: Never used  Substance and Sexual Activity  . Alcohol use: Yes    Comment: occ  . Drug use: Not on file  . Sexual activity: Not on file  Other Topics Concern  . Not on file  Social History Narrative  . Not on file   Social Determinants of Health   Financial Resource Strain:   . Difficulty of Paying Living Expenses: Not on file  Food Insecurity:   . Worried About Programme researcher, broadcasting/film/video in the Last Year: Not on file  . Ran Out of Food in the Last Year: Not on file  Transportation Needs:   . Lack of Transportation (Medical): Not on file  . Lack of Transportation (Non-Medical): Not on file  Physical Activity:   . Days of Exercise per Week: Not on file  . Minutes of Exercise per Session: Not on  file  Stress:   . Feeling of Stress : Not on file  Social Connections:   . Frequency of Communication with Friends and Family: Not on file  . Frequency of Social Gatherings with Friends and Family: Not on file  . Attends Religious Services: Not on file  . Active Member of Clubs or Organizations: Not on file  . Attends Banker Meetings: Not on file  . Marital Status: Not on file  Intimate Partner Violence:   . Fear of Current or Ex-Partner: Not on file  . Emotionally Abused: Not on file  . Physically Abused: Not on file  . Sexually Abused: Not on file    Outpatient Medications Prior to Visit  Medication Sig Dispense Refill  . lisinopril (ZESTRIL) 2.5 MG tablet Take 1 tablet (2.5 mg total) by mouth daily. 30 tablet 11  . metoprolol succinate (TOPROL-XL) 25 MG 24 hr tablet Take 1 tablet (25 mg total) by mouth daily. 30 tablet 11  . aspirin EC 81 MG tablet Take 1 tablet (81 mg total) by mouth daily.    Marland Kitchen atorvastatin (LIPITOR) 20 MG tablet Take 1 tablet (20 mg total) by mouth daily. (Patient not taking: Reported on  03/04/2020) 90 tablet 3  . benzonatate (TESSALON) 100 MG capsule Take 1 capsule (100 mg total) by mouth every 8 (eight) hours. (Patient not taking: Reported on 03/04/2020) 21 capsule 0   No facility-administered medications prior to visit.    Allergies  Allergen Reactions  . Aspirin Nausea Only    Pt reports anything above 81 mg causes stomach aches      ROS Review of Systems  Constitutional: Positive for fatigue and fever.  Respiratory: Positive for cough and shortness of breath.   Cardiovascular: Positive for chest pain.  All other systems reviewed and are negative.     Objective:    Physical Exam Vitals reviewed.  Constitutional:      Appearance: She is obese.  HENT:     Nose: Nose normal.  Cardiovascular:     Rate and Rhythm: Normal rate and regular rhythm.     Pulses: Normal pulses.     Heart sounds: Normal heart sounds.  Pulmonary:      Effort: Pulmonary effort is normal.     Breath sounds: Normal breath sounds.  Abdominal:     General: Bowel sounds are normal.  Musculoskeletal:        General: Normal range of motion.     Cervical back: Normal range of motion.  Skin:    General: Skin is warm and dry.  Neurological:     Mental Status: She is alert and oriented to person, place, and time.  Psychiatric:        Mood and Affect: Mood normal.        Behavior: Behavior normal.        Thought Content: Thought content normal.        Judgment: Judgment normal.     There were no vitals taken for this visit. Wt Readings from Last 3 Encounters:  02/05/20 209 lb (94.8 kg)  01/24/20 216 lb 12.8 oz (98.3 kg)  10/17/19 210 lb 6.4 oz (95.4 kg)     Health Maintenance Due  Topic Date Due  . Hepatitis C Screening  Never done  . COVID-19 Vaccine (1) Never done  . INFLUENZA VACCINE  Never done    There are no preventive care reminders to display for this patient.  Lab Results  Component Value Date   TSH 1.490 12/25/2019   Lab Results  Component Value Date   WBC 3.2 (L) 03/04/2020   HGB 13.2 03/04/2020   HCT 40.8 03/04/2020   MCV 91.9 03/04/2020   PLT 288 03/04/2020   Lab Results  Component Value Date   NA 137 03/04/2020   K 4.3 03/04/2020   CO2 21 (L) 03/04/2020   GLUCOSE 110 (H) 03/04/2020   BUN 7 03/04/2020   CREATININE 0.81 03/04/2020   BILITOT 0.4 12/25/2019   ALKPHOS 82 12/25/2019   AST 12 12/25/2019   ALT 17 12/25/2019   PROT 6.6 12/25/2019   ALBUMIN 3.9 12/25/2019   CALCIUM 8.8 (L) 03/04/2020   ANIONGAP 10 03/04/2020   Lab Results  Component Value Date   CHOL 303 (H) 12/25/2019   Lab Results  Component Value Date   HDL 55 12/25/2019   Lab Results  Component Value Date   LDLCALC 193 (H) 12/25/2019   Lab Results  Component Value Date   TRIG 283 (H) 12/25/2019   Lab Results  Component Value Date   CHOLHDL 5.5 (H) 12/25/2019   No results found for: HGBA1C    Assessment & Plan:   Kristina Krause was seen today for  covid positive.  Diagnoses and all orders for this visit:  COVID-19 Your test for COVID-19 was positive, meaning that you were infected with the novel coronavirus and could give the germ to others.  Please continue isolation at home, for at least 10 days since the start of your fever/cough/breathlessness and until you have had 3 consecutive days without fever (without taking a fever reducer) and with cough/breathlessness improving. Please continue good preventive care measures, including:  frequent hand-washing, avoid touching your face, cover coughs/sneezes, stay out of crowds and keep a 6 foot distance from others.  Recheck or go to the nearest hospital ED tent for re-assessment if fever/cough/breathlessness return. -     albuterol (VENTOLIN HFA) 108 (90 Base) MCG/ACT inhaler; Inhale 2 puffs into the lungs every 6 (six) hours as needed for wheezing or shortness of breath.    Meds ordered this encounter  Medications  . albuterol (VENTOLIN HFA) 108 (90 Base) MCG/ACT inhaler    Sig: Inhale 2 puffs into the lungs every 6 (six) hours as needed for wheezing or shortness of breath.    Dispense:  8 g    Refill:  0    Follow-up: Return if symptoms worsen or fail to improve.  Reviewed previous encounters labs and imaging  Grayce Sessions, NP

## 2020-03-04 NOTE — ED Notes (Signed)
Patient verbalizes understanding of discharge instructions. Opportunity for questioning and answers were provided. Pt discharged from ED ambulatory.  

## 2020-03-06 NOTE — Patient Instructions (Signed)
COVID-19 COVID-19 is a respiratory infection that is caused by a virus called severe acute respiratory syndrome coronavirus 2 (SARS-CoV-2). The disease is also known as coronavirus disease or novel coronavirus. In some people, the virus may not cause any symptoms. In others, it may cause a serious infection. The infection can get worse quickly and can lead to complications, such as:  Pneumonia, or infection of the lungs.  Acute respiratory distress syndrome or ARDS. This is a condition in which fluid build-up in the lungs prevents the lungs from filling with air and passing oxygen into the blood.  Acute respiratory failure. This is a condition in which there is not enough oxygen passing from the lungs to the body or when carbon dioxide is not passing from the lungs out of the body.  Sepsis or septic shock. This is a serious bodily reaction to an infection.  Blood clotting problems.  Secondary infections due to bacteria or fungus.  Organ failure. This is when your body's organs stop working. The virus that causes COVID-19 is contagious. This means that it can spread from person to person through droplets from coughs and sneezes (respiratory secretions). What are the causes? This illness is caused by a virus. You may catch the virus by:  Breathing in droplets from an infected person. Droplets can be spread by a person breathing, speaking, singing, coughing, or sneezing.  Touching something, like a table or a doorknob, that was exposed to the virus (contaminated) and then touching your mouth, nose, or eyes. What increases the risk? Risk for infection You are more likely to be infected with this virus if you:  Are within 6 feet (2 meters) of a person with COVID-19.  Provide care for or live with a person who is infected with COVID-19.  Spend time in crowded indoor spaces or live in shared housing. Risk for serious illness You are more likely to become seriously ill from the virus if you:   Are 50 years of age or older. The higher your age, the more you are at risk for serious illness.  Live in a nursing home or long-term care facility.  Have cancer.  Have a long-term (chronic) disease such as: ? Chronic lung disease, including chronic obstructive pulmonary disease or asthma. ? A long-term disease that lowers your body's ability to fight infection (immunocompromised). ? Heart disease, including heart failure, a condition in which the arteries that lead to the heart become narrow or blocked (coronary artery disease), a disease which makes the heart muscle thick, weak, or stiff (cardiomyopathy). ? Diabetes. ? Chronic kidney disease. ? Sickle cell disease, a condition in which red blood cells have an abnormal "sickle" shape. ? Liver disease.  Are obese. What are the signs or symptoms? Symptoms of this condition can range from mild to severe. Symptoms may appear any time from 2 to 14 days after being exposed to the virus. They include:  A fever or chills.  A cough.  Difficulty breathing.  Headaches, body aches, or muscle aches.  Runny or stuffy (congested) nose.  A sore throat.  New loss of taste or smell. Some people may also have stomach problems, such as nausea, vomiting, or diarrhea. Other people may not have any symptoms of COVID-19. How is this diagnosed? This condition may be diagnosed based on:  Your signs and symptoms, especially if: ? You live in an area with a COVID-19 outbreak. ? You recently traveled to or from an area where the virus is common. ? You   provide care for or live with a person who was diagnosed with COVID-19. ? You were exposed to a person who was diagnosed with COVID-19.  A physical exam.  Lab tests, which may include: ? Taking a sample of fluid from the back of your nose and throat (nasopharyngeal fluid), your nose, or your throat using a swab. ? A sample of mucus from your lungs (sputum). ? Blood tests.  Imaging tests, which  may include, X-rays, CT scan, or ultrasound. How is this treated? At present, there is no medicine to treat COVID-19. Medicines that treat other diseases are being used on a trial basis to see if they are effective against COVID-19. Your health care provider will talk with you about ways to treat your symptoms. For most people, the infection is mild and can be managed at home with rest, fluids, and over-the-counter medicines. Treatment for a serious infection usually takes places in a hospital intensive care unit (ICU). It may include one or more of the following treatments. These treatments are given until your symptoms improve.  Receiving fluids and medicines through an IV.  Supplemental oxygen. Extra oxygen is given through a tube in the nose, a face mask, or a hood.  Positioning you to lie on your stomach (prone position). This makes it easier for oxygen to get into the lungs.  Continuous positive airway pressure (CPAP) or bi-level positive airway pressure (BPAP) machine. This treatment uses mild air pressure to keep the airways open. A tube that is connected to a motor delivers oxygen to the body.  Ventilator. This treatment moves air into and out of the lungs by using a tube that is placed in your windpipe.  Tracheostomy. This is a procedure to create a hole in the neck so that a breathing tube can be inserted.  Extracorporeal membrane oxygenation (ECMO). This procedure gives the lungs a chance to recover by taking over the functions of the heart and lungs. It supplies oxygen to the body and removes carbon dioxide. Follow these instructions at home: Lifestyle  If you are sick, stay home except to get medical care. Your health care provider will tell you how long to stay home. Call your health care provider before you go for medical care.  Rest at home as told by your health care provider.  Do not use any products that contain nicotine or tobacco, such as cigarettes, e-cigarettes, and  chewing tobacco. If you need help quitting, ask your health care provider.  Return to your normal activities as told by your health care provider. Ask your health care provider what activities are safe for you. General instructions  Take over-the-counter and prescription medicines only as told by your health care provider.  Drink enough fluid to keep your urine pale yellow.  Keep all follow-up visits as told by your health care provider. This is important. How is this prevented?  There is no vaccine to help prevent COVID-19 infection. However, there are steps you can take to protect yourself and others from this virus. To protect yourself:   Do not travel to areas where COVID-19 is a risk. The areas where COVID-19 is reported change often. To identify high-risk areas and travel restrictions, check the CDC travel website: wwwnc.cdc.gov/travel/notices  If you live in, or must travel to, an area where COVID-19 is a risk, take precautions to avoid infection. ? Stay away from people who are sick. ? Wash your hands often with soap and water for 20 seconds. If soap and water   are not available, use an alcohol-based hand sanitizer. ? Avoid touching your mouth, face, eyes, or nose. ? Avoid going out in public, follow guidance from your state and local health authorities. ? If you must go out in public, wear a cloth face covering or face mask. Make sure your mask covers your nose and mouth. ? Avoid crowded indoor spaces. Stay at least 6 feet (2 meters) away from others. ? Disinfect objects and surfaces that are frequently touched every day. This may include:  Counters and tables.  Doorknobs and light switches.  Sinks and faucets.  Electronics, such as phones, remote controls, keyboards, computers, and tablets. To protect others: If you have symptoms of COVID-19, take steps to prevent the virus from spreading to others.  If you think you have a COVID-19 infection, contact your health care  provider right away. Tell your health care team that you think you may have a COVID-19 infection.  Stay home. Leave your house only to seek medical care. Do not use public transport.  Do not travel while you are sick.  Wash your hands often with soap and water for 20 seconds. If soap and water are not available, use alcohol-based hand sanitizer.  Stay away from other members of your household. Let healthy household members care for children and pets, if possible. If you have to care for children or pets, wash your hands often and wear a mask. If possible, stay in your own room, separate from others. Use a different bathroom.  Make sure that all people in your household wash their hands well and often.  Cough or sneeze into a tissue or your sleeve or elbow. Do not cough or sneeze into your hand or into the air.  Wear a cloth face covering or face mask. Make sure your mask covers your nose and mouth. Where to find more information  Centers for Disease Control and Prevention: www.cdc.gov/coronavirus/2019-ncov/index.html  World Health Organization: www.who.int/health-topics/coronavirus Contact a health care provider if:  You live in or have traveled to an area where COVID-19 is a risk and you have symptoms of the infection.  You have had contact with someone who has COVID-19 and you have symptoms of the infection. Get help right away if:  You have trouble breathing.  You have pain or pressure in your chest.  You have confusion.  You have bluish lips and fingernails.  You have difficulty waking from sleep.  You have symptoms that get worse. These symptoms may represent a serious problem that is an emergency. Do not wait to see if the symptoms will go away. Get medical help right away. Call your local emergency services (911 in the [place] Do not drive yourself to the hospital. Let the emergency medical personnel know if you think you have COVID-19. Summary  COVID-19 is a  respiratory infection that is caused by a virus. It is also known as coronavirus disease or novel coronavirus. It can cause serious infections, such as pneumonia, acute respiratory distress syndrome, acute respiratory failure, or sepsis.  The virus that causes COVID-19 is contagious. This means that it can spread from person to person through droplets from breathing, speaking, singing, coughing, or sneezing.  You are more likely to develop a serious illness if you are 50 years of age or older, have a weak immune system, live in a nursing home, or have chronic disease.  There is no medicine to treat COVID-19. Your health care provider will talk with you about ways to treat your symptoms.    Take steps to protect yourself and others from infection. Wash your hands often and disinfect objects and surfaces that are frequently touched every day. Stay away from people who are sick and wear a mask if you are sick. This information is not intended to replace advice given to you by your health care provider. Make sure you discuss any questions you have with your health care provider. Document Revised: 03/24/2019 Document Reviewed: 06/30/2018 Elsevier Patient Education  2020 Elsevier Inc.  

## 2020-03-08 ENCOUNTER — Ambulatory Visit: Payer: Managed Care, Other (non HMO) | Admitting: Internal Medicine

## 2020-03-14 ENCOUNTER — Encounter (INDEPENDENT_AMBULATORY_CARE_PROVIDER_SITE_OTHER): Payer: Self-pay | Admitting: Primary Care

## 2020-03-15 ENCOUNTER — Telehealth (INDEPENDENT_AMBULATORY_CARE_PROVIDER_SITE_OTHER): Payer: Self-pay | Admitting: Primary Care

## 2020-03-15 ENCOUNTER — Telehealth: Payer: Self-pay | Admitting: Cardiology

## 2020-03-15 NOTE — Telephone Encounter (Signed)
Pt states she has been trying to get a work note to return to work.  She stayed out a little longer than her work allotted so her work needs a Physicist, medical stating it is OK for her to return to work. Pt hopes to get the letter today.  Ok to put on [place], or she can pick up.

## 2020-03-15 NOTE — Telephone Encounter (Signed)
Returned call to patient-she states she needs a note to return to work next week after having covid.    She states she had covid and got PNA as well.   She has finally recovered and needs to return to work.      She saw PCP 9/27 in follow up.    Advised Dr. Cristal Deer unable to write note as she has not evaluated her since 8/18, prior to having covid.   Offered to schedule appointment but advised this would need to come from PCP since she evaluated her post covid.    She reports frustrations with PCP as she has been calling and no one is returning her call.    Encouraged to continue to attempt to reach out to PCP.   Patient verbalized understanding.

## 2020-03-15 NOTE — Telephone Encounter (Signed)
  Follow Up:    Pt said Alisha knew about a letter that she needs to return to work on Monday. She needs this letter today please if possible. Pt had COVID and the letter needs to state that pt is clear to return to work.Marland Kitchen

## 2020-03-18 NOTE — Telephone Encounter (Signed)
Please advise on if you can provide patient a return to work letter.

## 2020-03-19 ENCOUNTER — Encounter (INDEPENDENT_AMBULATORY_CARE_PROVIDER_SITE_OTHER): Payer: Self-pay | Admitting: Primary Care

## 2020-03-20 ENCOUNTER — Ambulatory Visit (INDEPENDENT_AMBULATORY_CARE_PROVIDER_SITE_OTHER): Payer: Self-pay | Admitting: *Deleted

## 2020-03-20 ENCOUNTER — Other Ambulatory Visit: Payer: Managed Care, Other (non HMO)

## 2020-03-20 DIAGNOSIS — Z20822 Contact with and (suspected) exposure to covid-19: Secondary | ICD-10-CM

## 2020-03-20 NOTE — Telephone Encounter (Signed)
  C/o covid symptoms getting better and now came back last night. C/o dry cough, SOB, heaviness in back and lungs and back achy. Unable to lay down to get better due to back pain. Denies fever, difficulty breathing. Patient reports going back to work and now symptoms have returned. Virtual appt 03/21/20. Care advise given. Patient verbalized understanding of care advise and to call back or go to [facility] or ED if symptoms worsen.    Reason for Disposition . [1] PERSISTING SYMPTOMS OF COVID-19 AND [2] symptoms WORSE  Answer Assessment - Initial Assessment Questions 1. COVID-19 ONSET: "When did the symptoms of COVID-19 first start?"     03/03/20 2. DIAGNOSIS CONFIRMATION: "How were you diagnosed?" (e.g., COVID-19 oral or nasal viral test; COVID-19 antibody test; doctor visit)     [facility]  3. MAIN SYMPTOM:  "What is your main concern or symptom right now?" (e.g., breathing difficulty, cough, fatigue. loss of smell)     Reoccurring symptoms. Was better after 14 days and now cough , heaviness in chest and lungs and back aches 4. SYMPTOM ONSET: "When did the pain  start?"     Last night  5. BETTER-SAME-WORSE: "Are you getting better, staying the same, or getting worse over the last 1 to 2 weeks?"     Getting worse 6. RECENT MEDICAL VISIT: "Have you been seen by a healthcare provider (doctor, NP, PA) for these persisting COVID-19 symptoms?" If Yes, ask: "When were you seen?" (e.g., date)     no 7. COUGH: "Do you have a cough?" If Yes, ask: "How bad is the cough?"       Yes dry cough  8. FEVER: "Do you have a fever?" If Yes, ask: "What is your temperature, how was it measured, and when did it start?"     no 9. BREATHING DIFFICULTY: "Are you having any trouble breathing?" If Yes, ask: "How bad is your breathing?" (e.g., mild, moderate, severe)    - MILD: No SOB at rest, mild SOB with walking, speaks normally in sentences, can lay down, no retractions, pulse < 100.    - MODERATE: SOB at rest, SOB with  minimal exertion and prefers to sit, cannot lie down flat, speaks in phrases, mild retractions, audible wheezing, pulse 100-120.    - SEVERE: Very SOB at rest, speaks in single words, struggling to breathe, sitting hunched forward, retractions, pulse > 120       Mild  10. HIGH RISK DISEASE: "Do you have any chronic medical problems?" (e.g., asthma, heart or lung disease, weak immune system, obesity, etc.)       Heart attack , stent  11. PREGNANCY: "Is there any chance you are pregnant?" "When was your last menstrual period?"       na 12. OTHER SYMPTOMS: "Do you have any other symptoms?"  (e.g., fatigue, headache, muscle pain, weakness)       Back achy, mild SOB, cough, taste and smell not back 100% yet  Protocols used: CORONAVIRUS (COVID-19) PERSISTING SYMPTOMS FOLLOW-UP CALL-A-AH

## 2020-03-21 ENCOUNTER — Ambulatory Visit (HOSPITAL_COMMUNITY)
Admission: RE | Admit: 2020-03-21 | Discharge: 2020-03-21 | Disposition: A | Discharge status: Home / Self Care | Payer: Managed Care, Other (non HMO) | Source: Ambulatory Visit | Attending: Primary Care | Admitting: Primary Care

## 2020-03-21 ENCOUNTER — Ambulatory Visit (INDEPENDENT_AMBULATORY_CARE_PROVIDER_SITE_OTHER): Payer: Managed Care, Other (non HMO) | Admitting: Primary Care

## 2020-03-21 ENCOUNTER — Encounter (INDEPENDENT_AMBULATORY_CARE_PROVIDER_SITE_OTHER): Payer: Self-pay | Admitting: Primary Care

## 2020-03-21 ENCOUNTER — Other Ambulatory Visit: Payer: Self-pay

## 2020-03-21 DIAGNOSIS — J1282 Pneumonia due to coronavirus disease 2019: Secondary | ICD-10-CM

## 2020-03-21 DIAGNOSIS — U071 COVID-19: Secondary | ICD-10-CM | POA: Insufficient documentation

## 2020-03-21 LAB — NOVEL CORONAVIRUS, NAA: SARS-CoV-2, NAA: NOT DETECTED

## 2020-03-21 LAB — SARS-COV-2, NAA 2 DAY TAT

## 2020-03-21 NOTE — Progress Notes (Signed)
Pt still has cough and shortness of breath with chest and back pain Had a PCR test done yesterday has not gotten results back as of yet  She has returned to work

## 2020-03-23 NOTE — Progress Notes (Signed)
Telephone Note  I connected with Kristina Krause on 03/23/20 at  8:30 AM EDT by telephone and verified that I am speaking with the correct person using two identifiers.  Patient is at home I discussed the limitations, risks, security and privacy concerns of performing an evaluation and management service by telephone and the availability of in person appointments. I also discussed with the patient that there may be a patient responsible charge related to this service. The patient expressed understanding and agreed to proceed. Gwinda Passe is in the office at [facility]  History of Present Illness: Ms.Kristina Krause is a 49 year old female was positive for COVID-19 positive test (U07.1, COVID-19) with Acute Respiratory Distress Syndrome (ARDS) (J80, ARDS) And pneumonia is concern that she is still having cough, shortness of breath chest pain and back pain.  She had PCR done yesterday and results have not resulted Past Medical History:  Diagnosis Date  . DVT (deep vein thrombosis) in pregnancy   . Myocardial infarct [facility])    Current Outpatient Medications on File Prior to Visit  Medication Sig Dispense Refill  . albuterol (VENTOLIN HFA) 108 (90 Base) MCG/ACT inhaler Inhale 2 puffs into the lungs every 6 (six) hours as needed for wheezing or shortness of breath. 8 g 0  . lisinopril (ZESTRIL) 2.5 MG tablet Take 1 tablet (2.5 mg total) by mouth daily. 30 tablet 11  . metoprolol succinate (TOPROL-XL) 25 MG 24 hr tablet Take 1 tablet (25 mg total) by mouth daily. 30 tablet 11  . aspirin EC 81 MG tablet Take 1 tablet (81 mg total) by mouth daily.    Marland Kitchen atorvastatin (LIPITOR) 20 MG tablet Take 1 tablet (20 mg total) by mouth daily. (Patient not taking: Reported on 03/04/2020) 90 tablet 3  . benzonatate (TESSALON) 100 MG capsule Take 1 capsule (100 mg total) by mouth every 8 (eight) hours. (Patient not taking: Reported on 03/21/2020) 21 capsule 0   No current facility-administered  medications on file prior to visit.   Observations/Objective: Review of Systems  Constitutional: Positive for malaise/fatigue.  Respiratory: Positive for cough and shortness of breath.   Cardiovascular: Positive for chest pain.  Musculoskeletal: Positive for back pain.   Assessment and Plan: Meria was seen today for covid positive.  Diagnoses and all orders for this visit:  Pneumonia due to COVID-19 virus Rule out pneumonia still present with a 2 view chest x-ray patient will be able to go and have x-rays done -     DG Chest 2 View; Future    Follow Up Instructions:    I discussed the assessment and treatment plan with the patient. The patient was provided an opportunity to ask questions and all were answered. The patient agreed with the plan and demonstrated an understanding of the instructions.   The patient was advised to call back or seek an in-person evaluation if the symptoms worsen or if the condition fails to improve as anticipated.  I provided 14 minutes of non-face-to-face time during this encounter.   Grayce Sessions, NP

## 2020-03-24 ENCOUNTER — Encounter: Payer: Self-pay | Admitting: Cardiology

## 2020-03-29 ENCOUNTER — Telehealth: Payer: Self-pay | Admitting: Cardiology

## 2020-03-29 NOTE — Telephone Encounter (Signed)
Left a message for the patient to call back.  

## 2020-03-29 NOTE — Telephone Encounter (Signed)
Pt called about her exemption forms being filled out. Please call back

## 2020-03-29 NOTE — Telephone Encounter (Signed)
Spoke with pt and informed we have received forms and MD has reviewed them. Pt made aware that MD noted in her medical opinion, pt does not have a medical contraindication for the COVID vaccine and general recommendations are for vaccination 90 days after COVID infection. Pt verbalized understanding.  Form faxed to number provided by pt at [phone].

## 2020-04-04 ENCOUNTER — Telehealth: Payer: Self-pay | Admitting: Cardiology

## 2020-04-04 NOTE — Telephone Encounter (Signed)
Attempted to return call to pt.  Call went to VM.  Advised would forward to Dr. Cristal Deer for further recommendations.    Advised she could also discuss at her upcoming appt scheduled for April 26 2020

## 2020-04-04 NOTE — Telephone Encounter (Signed)
Patient requesting an echo.

## 2020-04-10 ENCOUNTER — Telehealth: Payer: Self-pay | Admitting: Cardiology

## 2020-04-10 NOTE — Telephone Encounter (Signed)
Follow Up:     Pt said she was here on Monday and requested a letter on letterhead for work. Her job would not except anything but this. She would like to know if she will be able to pick this letter up today please?

## 2020-04-10 NOTE — Telephone Encounter (Signed)
Called the patient to request more information regarding her letter for work. No answer. Left a message to call office back.

## 2020-04-10 NOTE — Telephone Encounter (Signed)
Patient is requesting letter that she has to wait 90 days after having the antibody infusion for Covid prior to getting Covid vaccine. Needs this for work Will forward to Dr Cristal Deer for review

## 2020-04-10 NOTE — Telephone Encounter (Signed)
Patient is returning call.  °

## 2020-04-11 ENCOUNTER — Telehealth (INDEPENDENT_AMBULATORY_CARE_PROVIDER_SITE_OTHER): Payer: Self-pay | Admitting: Primary Care

## 2020-04-11 ENCOUNTER — Telehealth: Payer: Self-pay | Admitting: Cardiology

## 2020-04-11 NOTE — Telephone Encounter (Signed)
Called patient to confirm appointment for tomorrow, stated she needed to reschedule because of starting a new job. Also stated that she needed a letter from [place] stating that she could not receive the COVID vaccine until 90 days after her infusion. Patient needs this letter for her new employer so she can be cleared to start working. Patient stated this was urgent and requested the letter to be sent with a letterhead via MyChart. Please follow up.

## 2020-04-11 NOTE — Telephone Encounter (Signed)
Yes, this is correct. Recommended 90 days after receiving monoclonal antibody treatment before getting vaccine.   Thanks!  Tammy Sours

## 2020-04-11 NOTE — Telephone Encounter (Signed)
Pt updated to contact PCP and verbalized understanding.

## 2020-04-11 NOTE — Telephone Encounter (Signed)
Pt updated and verbalized understanding.  

## 2020-04-11 NOTE — Telephone Encounter (Signed)
    I went in pt's chart to see who pt need to talk to. I transferred the call to Gregor Hams.

## 2020-04-11 NOTE — Telephone Encounter (Signed)
I do not comment on Covid exclusions for work, but I recommend she talk with her primary care if this is needed. Thanks.

## 2020-04-11 NOTE — Telephone Encounter (Signed)
We will wait for appt, thanks.

## 2020-04-12 ENCOUNTER — Ambulatory Visit (INDEPENDENT_AMBULATORY_CARE_PROVIDER_SITE_OTHER): Payer: Managed Care, Other (non HMO) | Admitting: Primary Care

## 2020-04-22 ENCOUNTER — Telehealth (INDEPENDENT_AMBULATORY_CARE_PROVIDER_SITE_OTHER): Payer: Managed Care, Other (non HMO) | Admitting: Primary Care

## 2020-04-26 ENCOUNTER — Encounter: Payer: Self-pay | Admitting: Cardiology

## 2020-04-26 ENCOUNTER — Ambulatory Visit: Payer: Managed Care, Other (non HMO) | Admitting: Cardiology

## 2020-04-26 VITALS — BP 116/70 | HR 67 | Ht 65.0 in | Wt 218.6 lb

## 2020-04-26 DIAGNOSIS — I2510 Atherosclerotic heart disease of native coronary artery without angina pectoris: Secondary | ICD-10-CM

## 2020-04-26 DIAGNOSIS — E7800 Pure hypercholesterolemia, unspecified: Secondary | ICD-10-CM

## 2020-04-26 DIAGNOSIS — Z79899 Other long term (current) drug therapy: Secondary | ICD-10-CM | POA: Diagnosis not present

## 2020-04-26 DIAGNOSIS — Z9861 Coronary angioplasty status: Secondary | ICD-10-CM

## 2020-04-26 DIAGNOSIS — R002 Palpitations: Secondary | ICD-10-CM | POA: Diagnosis not present

## 2020-04-26 DIAGNOSIS — E782 Mixed hyperlipidemia: Secondary | ICD-10-CM | POA: Diagnosis not present

## 2020-04-26 MED ORDER — ATORVASTATIN CALCIUM 20 MG PO TABS: 20.0000 mg | ORAL_TABLET | Freq: Every day | ORAL | 3 refills | Status: DC

## 2020-04-26 NOTE — Patient Instructions (Addendum)
Medication Instructions:  Your Physician recommend you continue on your current medication as directed.    *If you need a refill on your cardiac medications before your next appointment, please call your pharmacy*   Lab Work: Your physician recommends that you return for lab work in around the beginning of February to see how well the atorvastatin is working. ( Fasting Lipid)   If you have labs (blood work) drawn today and your tests are completely normal, you will receive your results only by: Marland Kitchen MyChart Message (if you have MyChart) OR . A paper copy in the mail If you have any lab test that is abnormal or we need to change your treatment, we will call you to review the results.   Testing/Procedures: None   Follow-Up: At [facility], you and your health needs are our priority.  As part of our continuing mission to provide you with exceptional heart care, we have created designated Provider Care Teams.  These Care Teams include your primary Cardiologist (physician) and Advanced Practice Providers (APPs -  Physician Assistants and Nurse Practitioners) who all work together to provide you with the care you need, when you need it.  We recommend signing up for the patient portal called "MyChart".  Sign up information is provided on this After Visit Summary.  MyChart is used to connect with patients for Virtual Visits (Telemedicine).  Patients are able to view lab/test results, encounter notes, upcoming appointments, etc.  Non-urgent messages can be sent to your provider as well.   To learn more about what you can do with MyChart, go to [email or web address].    Your next appointment:   6 month(s)  The format for your next appointment:   In Person  Provider:   Jodelle Red, MD

## 2020-04-26 NOTE — Progress Notes (Signed)
Cardiology Office Note:    Date:  04/26/2020   ID:  [name], DOB [birth year], MRN [id number]  PCP:  [name], NP  Cardiologist:  Buford Dresser, MD  Referring MD: [name], NP   CC: follow up  History of Present Illness:    Kristina Krause is a 50 y.o. female with a hx of CAD s/p PCI 2016 (in [place]--no records), DVT during pregnancy in remote past, panic attacks who is seen for follow up. I initially met her 06/12/19 via televisit for chest pain.  Cardiac history: MI treated in 2016 at [facility] in [place] (now [place]. [place]). Had [place] 06/2019 for atypical chest pain--low risk, normal EF, small scar but no ischemia.   Today: Still with intermittent palpitations, unchanged.   Has been off atorvastatin for a time, plans to restart. Will refill today.   Has gotten religious exemption for her Covid shot.   Has mild LE edema at the end of the day, improved with elevation.   Denies chest pain, shortness of breath at rest or with normal exertion. No PND, orthopnea, or unexpected weight gain. No syncope.  Past Medical History:  Diagnosis Date  . DVT (deep vein thrombosis) in pregnancy   . Myocardial infarct [facility])     Past Surgical History:  Procedure Laterality Date  . CORONARY STENT PLACEMENT  05/2015    Current Medications: Current Outpatient Medications on File Prior to Visit  Medication Sig  . albuterol (VENTOLIN HFA) 108 (90 Base) MCG/ACT inhaler Inhale 2 puffs into the lungs every 6 (six) hours as needed for wheezing or shortness of breath.  Marland Kitchen aspirin EC 81 MG tablet Take 1 tablet (81 mg total) by mouth daily.  Marland Kitchen atorvastatin (LIPITOR) 20 MG tablet Take 1 tablet (20 mg total) by mouth daily.  . benzonatate (TESSALON) 100 MG capsule Take 1 capsule (100 mg total) by mouth every 8 (eight) hours.  Marland Kitchen lisinopril (ZESTRIL) 2.5 MG tablet Take 1 tablet (2.5 mg total) by mouth daily.  . metoprolol succinate (TOPROL-XL) 25 MG 24 hr tablet Take 1 tablet  (25 mg total) by mouth daily.   No current facility-administered medications on file prior to visit.     Allergies:   Aspirin   Social History   Tobacco Use  . Smoking status: Former Smoker    Types: Cigarettes  . Smokeless tobacco: Never Used  Vaping Use  . Vaping Use: Never used  Substance Use Topics  . Alcohol use: Yes    Comment: occ  . Drug use: Not on file    Family History: Mother and father each had MI later in life. Father died age 70, mother is living in her 17s  ROS:   Please see the history of present illness.  Additional pertinent ROS otherwise unremarkable    EKGs/Labs/Other Studies Reviewed:    The following studies were reviewed today: Zio 01/14/2020 ~3.5 days of data recorded on Zio monitor. Patient had a min HR of 47 bpm, max HR of 138 bpm, and avg HR of 71 bpm. Predominant underlying rhythm was Sinus Rhythm. No SVT, atrial fibrillation, high degree block, or pauses noted. There was one 6 beat run of an unclear rhythm; does not appear consistent with VT (as per label) but possibly SVT with aberrancy.  Isolated atrial and ventricular ectopy was rare (<1%). There were 8 triggered events, which were sinus rhythm. Some of these events had single beats of ectopy (either PAC or PVC). No significant arrhythmias detected.  Nuclear stress 06/29/19  The left ventricular ejection fraction is normal (55-65%).  Nuclear stress EF: 57%. Mild hypokinesis of the septal wall region  There was no ST segment deviation noted during stress.  Findings consistent with prior myocardial infarction.  This is an intermediate risk study based upon prior anteroseptal infarct pattern. However, there is no ischemia identified.  EKG:  EKG is personally reviewed.  The ekg ordered today demonstrates NSR, lHR 73 bpm  Recent Labs: 12/25/2019: ALT 17; TSH 1.490 03/04/2020: BUN 7; Creatinine, Ser 0.81; Hemoglobin 13.2; Platelets 288; Potassium 4.3; Sodium 137  Recent Lipid Panel     Component Value Date/Time   CHOL 303 (H) 12/25/2019 1548   TRIG 283 (H) 12/25/2019 1548   HDL 55 12/25/2019 1548   CHOLHDL 5.5 (H) 12/25/2019 1548   LDLCALC 193 (H) 12/25/2019 1548    Physical Exam:    VS:  BP 116/70   Pulse 67   Ht $R'5\' 5"'GT$  (1.651 m)   Wt 218 lb 9.6 oz (99.2 kg)   SpO2 100%   BMI 36.38 kg/m     Wt Readings from Last 3 Encounters:  04/26/20 218 lb 9.6 oz (99.2 kg)  02/05/20 209 lb (94.8 kg)  01/24/20 216 lb 12.8 oz (98.3 kg)    GEN: Well nourished, well developed in no acute distress HEENT: Normal, moist mucous membranes NECK: No JVD CARDIAC: regular rhythm, normal S1 and S2, no rubs or gallops. No murmur. VASCULAR: Radial and DP pulses 2+ bilaterally. No carotid bruits RESPIRATORY:  Clear to auscultation without rales, wheezing or rhonchi  ABDOMEN: Soft, non-tender, non-distended MUSCULOSKELETAL:  Ambulates independently SKIN: Warm and dry, no edema NEUROLOGIC:  Alert and oriented x 3. No focal neuro deficits noted. PSYCHIATRIC:  Normal affect   ASSESSMENT:    1. Palpitations   2. Coronary artery disease involving native coronary artery of native heart without angina pectoris   3. Medication management   4. Mixed hyperlipidemia   5. S/P coronary angioplasty   6. Elevated LDL cholesterol level    PLAN:    Palpitations: -reviewed monitor, above -no significant arrhythmias, reassuring  History of CAD with prior MI and PCI in [place]:  -no angina -lexiscan without ischemia 06/2019 -continue aspirin 81 mg -on metoprolol and lisinopril. Unclear what her EF was at the time of her MI, records would be helpful. EF on nuclear stress 57% -statin as below -counseled on red flag warning signs that need immediate medical attention  Mixed hyperlipidemia -we discussed goals and statin again today, she is willing to restart. Refilled today. Recheck lipids fasting in 3 mos  Cardiac risk counseling and prevention recommendations: Secondary prevention given  known history, also with family history of heart disease. -recommend heart healthy/Mediterranean diet, with whole grains, fruits, vegetable, fish, lean meats, nuts, and olive oil. Limit salt. -recommend moderate walking, 3-5 times/week for 30-50 minutes each session. Aim for at least 150 minutes.week. Goal should be pace of 3 miles/hours, or walking 1.5 miles in 30 minutes -recommend avoidance of tobacco products. Avoid excess alcohol.  Plan for follow up: 6 mos or sooner as needed  Buford Dresser, MD, PhD [place]  [facility]    Medication Adjustments/Labs and Tests Ordered: Current medicines are reviewed at length with the patient today.  Concerns regarding medicines are outlined above.  Orders Placed This Encounter  Procedures  . Lipid panel   Meds ordered this encounter  Medications  . DISCONTD: atorvastatin (LIPITOR) 20 MG tablet    Sig:  Take 1 tablet (20 mg total) by mouth daily.    Dispense:  90 tablet    Refill:  3    Patient Instructions  Medication Instructions:  Your Physician recommend you continue on your current medication as directed.    *If you need a refill on your cardiac medications before your next appointment, please call your pharmacy*   Lab Work: Your physician recommends that you return for lab work in around the beginning of February to see how well the atorvastatin is working. ( Fasting Lipid)   If you have labs (blood work) drawn today and your tests are completely normal, you will receive your results only by: Marland Kitchen MyChart Message (if you have MyChart) OR . A paper copy in the mail If you have any lab test that is abnormal or we need to change your treatment, we will call you to review the results.   Testing/Procedures: None   Follow-Up: At [facility], you and your health needs are our priority.  As part of our continuing mission to provide you with exceptional heart care, we have created designated Provider Care Teams.  These  Care Teams include your primary Cardiologist (physician) and Advanced Practice Providers (APPs -  Physician Assistants and Nurse Practitioners) who all work together to provide you with the care you need, when you need it.  We recommend signing up for the patient portal called "MyChart".  Sign up information is provided on this After Visit Summary.  MyChart is used to connect with patients for Virtual Visits (Telemedicine).  Patients are able to view lab/test results, encounter notes, upcoming appointments, etc.  Non-urgent messages can be sent to your provider as well.   To learn more about what you can do with MyChart, go to [email or web address].    Your next appointment:   6 month(s)  The format for your next appointment:   In Person  Provider:   Buford Dresser, MD      Signed, Buford Dresser, MD PhD 04/26/2020     [place]

## 2020-05-10 ENCOUNTER — Ambulatory Visit: Payer: Managed Care, Other (non HMO) | Admitting: Internal Medicine

## 2020-05-22 ENCOUNTER — Other Ambulatory Visit: Payer: Self-pay

## 2020-05-22 ENCOUNTER — Encounter (HOSPITAL_COMMUNITY): Payer: Self-pay

## 2020-05-22 ENCOUNTER — Emergency Department (HOSPITAL_COMMUNITY)
Admission: EM | Admit: 2020-05-22 | Discharge: 2020-05-22 | Disposition: A | Discharge status: Home / Self Care | Payer: Self-pay | Attending: Emergency Medicine | Admitting: Emergency Medicine

## 2020-05-22 DIAGNOSIS — Z87891 Personal history of nicotine dependence: Secondary | ICD-10-CM | POA: Insufficient documentation

## 2020-05-22 DIAGNOSIS — I2510 Atherosclerotic heart disease of native coronary artery without angina pectoris: Secondary | ICD-10-CM | POA: Insufficient documentation

## 2020-05-22 DIAGNOSIS — Z79899 Other long term (current) drug therapy: Secondary | ICD-10-CM | POA: Insufficient documentation

## 2020-05-22 DIAGNOSIS — Z7982 Long term (current) use of aspirin: Secondary | ICD-10-CM | POA: Insufficient documentation

## 2020-05-22 DIAGNOSIS — R079 Chest pain, unspecified: Secondary | ICD-10-CM | POA: Insufficient documentation

## 2020-05-22 LAB — CBC
HCT: 36.7 % (ref 36.0–46.0)
Hemoglobin: 12.3 g/dL (ref 12.0–15.0)
MCH: 30.8 pg (ref 26.0–34.0)
MCHC: 33.5 g/dL (ref 30.0–36.0)
MCV: 92 fL (ref 80.0–100.0)
Platelets: 384 10*3/uL (ref 150–400)
RBC: 3.99 MIL/uL (ref 3.87–5.11)
RDW: 12.4 % (ref 11.5–15.5)
WBC: 5.4 10*3/uL (ref 4.0–10.5)
nRBC: 0 % (ref 0.0–0.2)

## 2020-05-22 LAB — BASIC METABOLIC PANEL
Anion gap: 8 (ref 5–15)
BUN: 11 mg/dL (ref 6–20)
CO2: 24 mmol/L (ref 22–32)
Calcium: 8.9 mg/dL (ref 8.9–10.3)
Chloride: 103 mmol/L (ref 98–111)
Creatinine, Ser: 0.68 mg/dL (ref 0.44–1.00)
GFR, Estimated: >60 mL/min (ref >60)
Glucose, Bld: 121 mg/dL — ABNORMAL HIGH (ref 70–99)
Potassium: 4 mmol/L (ref 3.5–5.1)
Sodium: 135 mmol/L (ref 135–145)

## 2020-05-22 LAB — TROPONIN I (HIGH SENSITIVITY)
Troponin I (High Sensitivity): 3 ng/L (ref <18)
Troponin I (High Sensitivity): <2 ng/L (ref <18)

## 2020-05-22 LAB — I-STAT BETA HCG BLOOD, ED (MC, WL, AP ONLY): I-stat hCG, quantitative: <5 m[IU]/mL (ref <5)

## 2020-05-22 MED ORDER — ASPIRIN EC 81 MG PO TBEC
162.0000 mg | DELAYED_RELEASE_TABLET | Freq: Once | ORAL | Status: AC
  Administered 2020-05-22: 162 mg via ORAL
  Filled 2020-05-22: qty 2

## 2020-05-22 NOTE — ED Provider Notes (Signed)
[facility]-EMERGENCY DEPT Provider Note   CSN: [id number] Arrival date & time: 05/22/20  1302     History Chief Complaint  Patient presents with  . Chest Pain    Kristina Krause is a 49 y.o. female.  Patient presents chief concern of chest pain.  Described as an ache.  Started around 11 AM this morning when she was cooking.  Otherwise nonradiating.  No associated palpitations or diaphoresis.  No shortness of breath.  Patient states her history is complicated with a history of MI in the past with one cardiac stents.  She is not on any particular blood thinner other than daily aspirin.  She recently had a stress test in March with her cardiologist which she states she was told was normal.        Past Medical History:  Diagnosis Date  . DVT (deep vein thrombosis) in pregnancy   . Myocardial infarct [facility])     Patient Active Problem List   Diagnosis Date Noted  . S/P coronary angioplasty 08/15/2019  . CAD (coronary artery disease) 06/22/2019    Past Surgical History:  Procedure Laterality Date  . CORONARY STENT PLACEMENT  05/2015     OB History   No obstetric history on file.     Family History  Problem Relation Age of Onset  . Heart attack Mother   . Diabetes Mother   . Kidney failure Mother   . Hypertension Mother   . Heart attack Father     Social History   Tobacco Use  . Smoking status: Former Smoker    Types: Cigarettes  . Smokeless tobacco: Never Used  Vaping Use  . Vaping Use: Never used  Substance Use Topics  . Alcohol use: Yes    Comment: occ    Home Medications Prior to Admission medications   Medication Sig Start Date End Date Taking? Authorizing Provider  albuterol (VENTOLIN HFA) 108 (90 Base) MCG/ACT inhaler Inhale 2 puffs into the lungs every 6 (six) hours as needed for wheezing or shortness of breath. 03/04/20  Yes Grayce Sessions, NP  aspirin EC 81 MG tablet Take 1 tablet (81 mg total) by mouth daily. 10/17/19  Yes  Jodelle Red, MD  lisinopril (ZESTRIL) 2.5 MG tablet Take 1 tablet (2.5 mg total) by mouth daily. 10/06/19  Yes Jodelle Red, MD  metoprolol succinate (TOPROL-XL) 25 MG 24 hr tablet Take 1 tablet (25 mg total) by mouth daily. 10/10/19  Yes Jodelle Red, MD  Omega 3 1000 MG CAPS Take 1,000 mg by mouth daily.   Yes [provider]  atorvastatin (LIPITOR) 20 MG tablet Take 1 tablet (20 mg total) by mouth daily. Patient not taking: No sig reported 04/26/20 07/25/20  Jodelle Red, MD  benzonatate (TESSALON) 100 MG capsule Take 1 capsule (100 mg total) by mouth every 8 (eight) hours. Patient not taking: No sig reported 02/28/20   Linus Mako B, NP    Allergies    Aspirin and Ibuprofen  Review of Systems   Review of Systems  Constitutional: Negative for fever.  HENT: Negative for ear pain.   Eyes: Negative for pain.  Respiratory: Negative for cough.   Cardiovascular: Positive for chest pain.  Gastrointestinal: Negative for abdominal pain.  Genitourinary: Negative for flank pain.  Musculoskeletal: Negative for back pain.  Skin: Negative for rash.  Neurological: Negative for headaches.    Physical Exam Updated Vital Signs BP 100/64   Pulse (!) 51   Temp 98.8 F (37.1 C) (  Oral)   Resp 19   LMP 03/25/2020 (Approximate)   SpO2 98%   Physical Exam Constitutional:      General: She is not in acute distress.    Appearance: Normal appearance.  HENT:     Head: Normocephalic.     Nose: Nose normal.  Eyes:     Extraocular Movements: Extraocular movements intact.  Cardiovascular:     Rate and Rhythm: Normal rate.  Pulmonary:     Effort: Pulmonary effort is normal.  Abdominal:     Palpations: Abdomen is soft.     Tenderness: There is no abdominal tenderness.  Musculoskeletal:        General: Normal range of motion.     Cervical back: Normal range of motion.     Right lower leg: No edema.     Left lower leg: No edema.  Skin:     General: Skin is warm.  Neurological:     General: No focal deficit present.     Mental Status: She is alert.     ED Results / Procedures / Treatments   Labs (all labs ordered are listed, but only abnormal results are displayed) Labs Reviewed  BASIC METABOLIC PANEL - Abnormal; Notable for the following components:      Result Value   Glucose, Bld 121 (*)    All other components within normal limits  CBC  I-STAT BETA HCG BLOOD, ED ([facility], [facility], [facility] ONLY)  TROPONIN I (HIGH SENSITIVITY)  TROPONIN I (HIGH SENSITIVITY)    EKG EKG Interpretation  Date/Time:  Wednesday May 22 2020 13:14:26 EST Ventricular Rate:  69 PR Interval:    QRS Duration: 86 QT Interval:  388 QTC Calculation: 416 R Axis:   30 Text Interpretation: Sinus rhythm Anteroseptal infarct, old 63 Lead; [place] Confirmed by Norman Clay ([id number]) on 05/22/2020 6:56:37 PM   Radiology No results found.  Procedures Procedures (including critical care time)  Medications Ordered in ED Medications  aspirin EC tablet 162 mg (162 mg Oral Given 05/22/20 1746)    ED Course  I have reviewed the triage vital signs and the nursing notes.  Pertinent labs & imaging results that were available during my care of the patient were reviewed by me and considered in my medical decision making (see chart for details).    MDM Rules/Calculators/A&P                          Had 2 sets of cardiac troponins which were negative.  EKG appears unremarkable as well.  No ST elevations or depressions noted.  Sinus rhythm otherwise.  She states she had a negative cardiac stress test 9 months ago.  Case discussed with cardiology on call.  Will advise outpatient follow-up with cardiology within the next 2 to 3 days.  Advised rest at home and immediate return for worsening symptoms trouble breathing or any additional concerns.    Final Clinical Impression(s) / ED Diagnoses Final diagnoses:  Chest pain, unspecified type    Rx / DC  Orders ED Discharge Orders    None       Cheryll Cockayne, MD 05/22/20 2006

## 2020-05-22 NOTE — Discharge Instructions (Addendum)
Call your primary care doctor or specialist as discussed in the next 2-3 days.   Return immediately back to the ER if:  Your symptoms worsen within the next 12-24 hours. You develop new symptoms such as new fevers, persistent vomiting, new pain, shortness of breath, or new weakness or numbness, or if you have any other concerns.  

## 2020-05-22 NOTE — ED Triage Notes (Signed)
Pt presents with c/o chest pain in the center of her chest that started at 11 am this morning. Pt denies any shortness of breath at this time. Pt reports a hx of an MI.

## 2020-06-11 ENCOUNTER — Telehealth (INDEPENDENT_AMBULATORY_CARE_PROVIDER_SITE_OTHER): Payer: Managed Care, Other (non HMO) | Admitting: Primary Care

## 2020-06-24 ENCOUNTER — Telehealth (INDEPENDENT_AMBULATORY_CARE_PROVIDER_SITE_OTHER): Payer: Medicaid Other | Admitting: Primary Care

## 2020-06-24 ENCOUNTER — Other Ambulatory Visit: Payer: Self-pay

## 2020-06-24 ENCOUNTER — Encounter (INDEPENDENT_AMBULATORY_CARE_PROVIDER_SITE_OTHER): Payer: Self-pay | Admitting: Primary Care

## 2020-06-24 DIAGNOSIS — J069 Acute upper respiratory infection, unspecified: Secondary | ICD-10-CM

## 2020-06-24 MED ORDER — FLUCONAZOLE 150 MG PO TABS: 150.0000 mg | ORAL_TABLET | Freq: Once | ORAL | 0 refills | Status: AC

## 2020-06-24 MED ORDER — AMOXICILLIN-POT CLAVULANATE 875-125 MG PO TABS
1.0000 | ORAL_TABLET | Freq: Two times a day (BID) | ORAL | 0 refills | Status: DC
Start: 2020-06-24 — End: 2020-07-03

## 2020-06-24 NOTE — Progress Notes (Signed)
I connected with  Kristina Krause on 06/24/20 by a video enabled telemedicine application and verified that I am speaking with the correct person using two identifiers.   I discussed the limitations of evaluation and management by telemedicine. The patient expressed understanding and agreed to proceed.   Pt was COVID positive and had pneumonia in Oct She has since been having tingling in her back with some tightness in her lungs  BP today 118/76

## 2020-06-25 ENCOUNTER — Telehealth (INDEPENDENT_AMBULATORY_CARE_PROVIDER_SITE_OTHER): Payer: Self-pay

## 2020-06-25 NOTE — Telephone Encounter (Signed)
Copied from CRM [id number]. Topic: Quick Communication - See Telephone Encounter >> Jun 24, 2020  2:40 PM [name] wrote: CRM for notification. See Telephone encounter for: 06/24/20. Pt states that when she was notified of appt today at 1:15 that she could get the call but her phone is somehow blocking Edward's call. Told pt looks as if in another appt now but someone would reach back out maybe to try a different line or FU for an appt tomorrow?

## 2020-06-27 ENCOUNTER — Encounter (INDEPENDENT_AMBULATORY_CARE_PROVIDER_SITE_OTHER): Payer: Self-pay | Admitting: Primary Care

## 2020-07-01 NOTE — Progress Notes (Signed)
Virtual Visit via Telephone Note  I connected with Kristina Krause on 07/01/20 at  1:30 PM EST by telephone and verified that I am speaking with the correct person using two identifiers.  Location: Patient: Home Provider: Gwinda Passe at [facility]  I discussed the limitations, risks, security and privacy concerns of performing an evaluation and management service by telephone and the availability of in person appointments. I also discussed with the patient that there may be a patient responsible charge related to this service. The patient expressed understanding and agreed to proceed.   History of Present Illness: Ms. Kristina Krause is a 50 year old female with upper respiratory symptoms.  She is concerned because in October she had COVID and pneumonia.  Since October she has been having tingling with some tightness in her lungs.  Cold cough and congestion.  She denies fever chills, nausea or vomiting or diarrhea   Past Medical History:  Diagnosis Date  . DVT (deep vein thrombosis) in pregnancy   . Myocardial infarct [facility])    Current Outpatient Medications on File Prior to Visit  Medication Sig Dispense Refill  . albuterol (VENTOLIN HFA) 108 (90 Base) MCG/ACT inhaler Inhale 2 puffs into the lungs every 6 (six) hours as needed for wheezing or shortness of breath. 8 g 0  . aspirin EC 81 MG tablet Take 1 tablet (81 mg total) by mouth daily.    Marland Kitchen atorvastatin (LIPITOR) 20 MG tablet Take 1 tablet (20 mg total) by mouth daily. 90 tablet 3  . lisinopril (ZESTRIL) 2.5 MG tablet Take 1 tablet (2.5 mg total) by mouth daily. 30 tablet 11  . metoprolol succinate (TOPROL-XL) 25 MG 24 hr tablet Take 1 tablet (25 mg total) by mouth daily. 30 tablet 11  . Omega 3 1000 MG CAPS Take 1,000 mg by mouth daily.     No current facility-administered medications on file prior to visit.   Observations/Objective: LMP 05/05/2020 (Exact Date) Blood pressure was taken today at home reading  118/76 All pertinent positives and negative are listed in HPI  Assessment and Plan: Diagnoses and all orders for this visit:  Acute upper respiratory infection -     amoxicillin-clavulanate (AUGMENTIN) 875-125 MG tablet; Take 1 tablet by mouth 2 (two) times daily. -     fluconazole (DIFLUCAN) 150 MG tablet; Take 1 tablet (150 mg total) by mouth once for 1 dose.   Follow Up Instructions:    I discussed the assessment and treatment plan with the patient. The patient was provided an opportunity to ask questions and all were answered. The patient agreed with the plan and demonstrated an understanding of the instructions.   The patient was advised to call back or seek an in-person evaluation if the symptoms worsen or if the condition fails to improve as anticipated.  I provided 13 minutes of non-face-to-face time during this encounter.   Grayce Sessions, NP

## 2020-07-03 ENCOUNTER — Emergency Department (HOSPITAL_COMMUNITY): Payer: Self-pay

## 2020-07-03 ENCOUNTER — Encounter (HOSPITAL_COMMUNITY): Payer: Self-pay

## 2020-07-03 ENCOUNTER — Emergency Department (HOSPITAL_COMMUNITY)
Admission: EM | Admit: 2020-07-03 | Discharge: 2020-07-03 | Disposition: A | Discharge status: Home / Self Care | Payer: Self-pay | Attending: Emergency Medicine | Admitting: Emergency Medicine

## 2020-07-03 DIAGNOSIS — Z7982 Long term (current) use of aspirin: Secondary | ICD-10-CM | POA: Insufficient documentation

## 2020-07-03 DIAGNOSIS — Z955 Presence of coronary angioplasty implant and graft: Secondary | ICD-10-CM | POA: Insufficient documentation

## 2020-07-03 DIAGNOSIS — I252 Old myocardial infarction: Secondary | ICD-10-CM | POA: Insufficient documentation

## 2020-07-03 DIAGNOSIS — R0789 Other chest pain: Secondary | ICD-10-CM | POA: Insufficient documentation

## 2020-07-03 DIAGNOSIS — Z87891 Personal history of nicotine dependence: Secondary | ICD-10-CM | POA: Insufficient documentation

## 2020-07-03 DIAGNOSIS — I2510 Atherosclerotic heart disease of native coronary artery without angina pectoris: Secondary | ICD-10-CM | POA: Insufficient documentation

## 2020-07-03 DIAGNOSIS — R0600 Dyspnea, unspecified: Secondary | ICD-10-CM | POA: Insufficient documentation

## 2020-07-03 HISTORY — DX: Atherosclerotic heart disease of native coronary artery without angina pectoris: I25.10

## 2020-07-03 LAB — CBC
HCT: 35.6 % — ABNORMAL LOW (ref 36.0–46.0)
Hemoglobin: 11.8 g/dL — ABNORMAL LOW (ref 12.0–15.0)
MCH: 30.3 pg (ref 26.0–34.0)
MCHC: 33.1 g/dL (ref 30.0–36.0)
MCV: 91.3 fL (ref 80.0–100.0)
Platelets: 372 10*3/uL (ref 150–400)
RBC: 3.9 MIL/uL (ref 3.87–5.11)
RDW: 12.4 % (ref 11.5–15.5)
WBC: 5.2 10*3/uL (ref 4.0–10.5)
nRBC: 0 % (ref 0.0–0.2)

## 2020-07-03 LAB — TROPONIN I (HIGH SENSITIVITY)
Troponin I (High Sensitivity): 2 ng/L (ref <18)
Troponin I (High Sensitivity): <2 ng/L (ref <18)

## 2020-07-03 LAB — BASIC METABOLIC PANEL
Anion gap: 10 (ref 5–15)
BUN: 8 mg/dL (ref 6–20)
CO2: 23 mmol/L (ref 22–32)
Calcium: 8.8 mg/dL — ABNORMAL LOW (ref 8.9–10.3)
Chloride: 105 mmol/L (ref 98–111)
Creatinine, Ser: 0.74 mg/dL (ref 0.44–1.00)
GFR, Estimated: >60 mL/min (ref >60)
Glucose, Bld: 116 mg/dL — ABNORMAL HIGH (ref 70–99)
Potassium: 4.1 mmol/L (ref 3.5–5.1)
Sodium: 138 mmol/L (ref 135–145)

## 2020-07-03 LAB — D-DIMER, QUANTITATIVE: D-Dimer, Quant: 1.31 ug/mL-FEU — ABNORMAL HIGH (ref 0.00–0.50)

## 2020-07-03 LAB — I-STAT BETA HCG BLOOD, ED (MC, WL, AP ONLY): I-stat hCG, quantitative: <5 m[IU]/mL (ref <5)

## 2020-07-03 MED ORDER — IOHEXOL 350 MG/ML SOLN
100.0000 mL | Freq: Once | INTRAVENOUS | Status: AC | PRN
  Administered 2020-07-03: 100 mL via INTRAVENOUS

## 2020-07-03 NOTE — ED Provider Notes (Signed)
[facility]-EMERGENCY DEPT Provider Note   CSN: [id number] Arrival date & time: 07/03/20  0031   History Chief complaint: Chest pain  Kristina Krause is a 50 y.o. female.  The history is provided by the patient.  She has history of hyperlipidemia, coronary artery disease status post stent placement, DVT in pregnancy and comes in because of right-sided chest pain and dyspnea which started this evening. Pain is dull and she rates it at 5/10. Pain does radiate to the right side of her back. There is no associated cough, nausea, vomiting, diaphoresis. Pain is worse when she lays on her back but otherwise, nothing seems to affected. It is not affected by taking a deep breath or by movement. She has not taken anything for it. It is distinctly different from the pain she had prior to having her cardiac stent placed. Of note, she did have COVID-19 infection last October.  Past Medical History:  Diagnosis Date  . Coronary artery disease   . DVT (deep vein thrombosis) in pregnancy   . Myocardial infarct [facility])     Patient Active Problem List   Diagnosis Date Noted  . S/P coronary angioplasty 08/15/2019  . CAD (coronary artery disease) 06/22/2019    Past Surgical History:  Procedure Laterality Date  . CORONARY STENT PLACEMENT  05/2015     OB History   No obstetric history on file.     Family History  Problem Relation Age of Onset  . Heart attack Mother   . Diabetes Mother   . Kidney failure Mother   . Hypertension Mother   . Heart attack Father     Social History   Tobacco Use  . Smoking status: Former Smoker    Types: Cigarettes  . Smokeless tobacco: Never Used  Vaping Use  . Vaping Use: Never used  Substance Use Topics  . Alcohol use: Yes    Comment: occ  . Drug use: Never    Home Medications Prior to Admission medications   Medication Sig Start Date End Date Taking? Authorizing Provider  albuterol (VENTOLIN HFA) 108 (90 Base) MCG/ACT inhaler  Inhale 2 puffs into the lungs every 6 (six) hours as needed for wheezing or shortness of breath. 03/04/20   Grayce Sessions, NP  amoxicillin-clavulanate (AUGMENTIN) 875-125 MG tablet Take 1 tablet by mouth 2 (two) times daily. 06/24/20   Grayce Sessions, NP  aspirin EC 81 MG tablet Take 1 tablet (81 mg total) by mouth daily. 10/17/19   Jodelle Red, MD  atorvastatin (LIPITOR) 20 MG tablet Take 1 tablet (20 mg total) by mouth daily. 04/26/20 07/25/20  Jodelle Red, MD  lisinopril (ZESTRIL) 2.5 MG tablet Take 1 tablet (2.5 mg total) by mouth daily. 10/06/19   Jodelle Red, MD  metoprolol succinate (TOPROL-XL) 25 MG 24 hr tablet Take 1 tablet (25 mg total) by mouth daily. 10/10/19   Jodelle Red, MD  Omega 3 1000 MG CAPS Take 1,000 mg by mouth daily.    [provider]    Allergies    Aspirin and Ibuprofen  Review of Systems   Review of Systems  All other systems reviewed and are negative.   Physical Exam Updated Vital Signs BP 122/81 (BP Location: Left Arm)   Pulse 65   Temp 98.3 F (36.8 C) (Oral)   Resp 17   SpO2 100%   Physical Exam Vitals and nursing note reviewed.   50 year old female, resting comfortably and in no acute distress. Vital signs  are normal. Oxygen saturation is 100%, which is normal. Head is normocephalic and atraumatic. PERRLA, EOMI. Oropharynx is clear. Neck is nontender and supple without adenopathy or JVD. Back is nontender and there is no CVA tenderness. Lungs are clear without rales, wheezes, or rhonchi. Chest is mildly tender in the right anterolateral chest wall. There is no crepitus. Heart has regular rate and rhythm without murmur. Abdomen is soft, flat, nontender without masses or hepatosplenomegaly and peristalsis is normoactive. Extremities have 2+ edema, full range of motion is present. Skin is warm and dry without rash. Neurologic: Mental status is normal, cranial nerves are intact, there are no  motor or sensory deficits.  ED Results / Procedures / Treatments   Labs (all labs ordered are listed, but only abnormal results are displayed) Labs Reviewed  BASIC METABOLIC PANEL - Abnormal; Notable for the following components:      Result Value   Glucose, Bld 116 (*)    Calcium 8.8 (*)    All other components within normal limits  CBC - Abnormal; Notable for the following components:   Hemoglobin 11.8 (*)    HCT 35.6 (*)    All other components within normal limits  D-DIMER, QUANTITATIVE (NOT AT [facility]) - Abnormal; Notable for the following components:   D-Dimer, Quant 1.31 (*)    All other components within normal limits  I-STAT BETA HCG BLOOD, ED ([facility], [facility], [facility] ONLY)  TROPONIN I (HIGH SENSITIVITY)  TROPONIN I (HIGH SENSITIVITY)    EKG EKG Interpretation  Date/Time:  Wednesday July 03 2020 00:59:27 EST Ventricular Rate:  67 PR Interval:    QRS Duration: 86 QT Interval:  387 QTC Calculation: 409 R Axis:   26 Text Interpretation: Sinus rhythm Low voltage, precordial leads Abnormal R-wave progression, early transition When compared with ECG of 05/22/2020, No significant change was found Confirmed by Dione Booze ([id number]) on 07/03/2020 1:47:42 AM   Radiology DG Chest 2 View  Result Date: 07/03/2020 CLINICAL DATA:  Right-sided chest pain radiating to the back, beginning a few hours ago. EXAM: CHEST - 2 VIEW COMPARISON:  03/21/2020 FINDINGS: The heart size and mediastinal contours are within normal limits. Both lungs are clear. The visualized skeletal structures are unremarkable. IMPRESSION: No active cardiopulmonary disease. Electronically Signed   By: Burman Nieves M.D.   On: 07/03/2020 01:31   CT Angio Chest PE W and/or Wo Contrast  Result Date: 07/03/2020 CLINICAL DATA:  Chest pain radiating to the back. Positive D-dimer. Pulmonary embolus is suspected with low to intermediate probability. EXAM: CT ANGIOGRAPHY CHEST WITH CONTRAST TECHNIQUE: Multidetector CT imaging of the  chest was performed using the standard protocol during bolus administration of intravenous contrast. Multiplanar CT image reconstructions and MIPs were obtained to evaluate the vascular anatomy. CONTRAST:  OMNIPAQUE IOHEXOL 350 MG/ML SOLN COMPARISON:  03/04/2020 FINDINGS: Cardiovascular: Poor contrast bolus limits examination but there is moderately good visualization of the central and proximal segmental pulmonary arteries. No focal filling defects are demonstrated. No evidence of significant central pulmonary embolus. Smaller peripheral emboli could be obscured. Normal heart size. No pericardial effusions. Normal caliber thoracic aorta. No evidence of aortic dissection. Great vessel origins are patent. Coronary artery and aortic calcifications. Mediastinum/Nodes: Esophagus is decompressed. No significant lymphadenopathy in the chest. Lungs/Pleura: Lungs are clear. No pleural effusions. No pneumothorax. Upper Abdomen: No acute abnormalities demonstrated in the visualized upper abdomen. Musculoskeletal: No chest wall abnormality. No acute or significant osseous findings. Review of the MIP images confirms the above findings. IMPRESSION: 1.  Poor contrast bolus limits examination but there is moderately good visualization of the central and proximal segmental pulmonary arteries. No evidence of significant central pulmonary embolus. 2. No evidence of active pulmonary disease. 3. Coronary artery and aortic calcifications. 4. Aortic atherosclerosis. Aortic Atherosclerosis (ICD10-I70.0). Electronically Signed   By: Burman Nieves M.D.   On: 07/03/2020 03:59    Procedures Procedures   Medications Ordered in ED Medications  iohexol (OMNIPAQUE) 350 MG/ML injection 100 mL (100 mLs Intravenous Contrast Given 07/03/20 0326)    ED Course  I have reviewed the triage vital signs and the nursing notes.  Pertinent labs & imaging results that were available during my care of the patient were reviewed by me and  considered in my medical decision making (see chart for details).  MDM Rules/Calculators/A&P Chest pain which is somewhat atypical on, doubt ACS. She is 3 months post COVID-19 infection, probably not at significantly increased risk for pulmonary embolism, but will screen with D-dimer. ECG is unremarkable and chest x-ray is unremarkable. Troponin is pending. Old records are reviewed, and her coronary stent was placed in 2016 in [place] state and records are not available, but she did have a Myoview stress test which did not show any reversible defects.  2:47 AM D-dimer is elevated, CT angiogram of the chest is ordered.   4:57 AM  CT angiogram shows no evidence of pulmonary embolism, no evidence of pneumonia.  Repeat troponin is normal.  Patient is reassured about negative work-up and is referred back to her cardiologist and primary care provider for follow-up.  Final Clinical Impression(s) / ED Diagnoses Final diagnoses:  Atypical chest pain    Rx / DC Orders ED Discharge Orders    None       Dione Booze, MD 07/03/20 [phone]

## 2020-07-03 NOTE — Discharge Instructions (Signed)
Your tests today did not show any signs of any heart problems or any blood clots in the lungs or pneumonia.  Please follow-up with your primary care provider and your cardiologist.  Continue taking all of your medications as prescribed.  Return to the emergency department if you have any new or concerning symptoms.

## 2020-07-03 NOTE — ED Triage Notes (Signed)
Pt complains of chest pain that radiates to her back all day today, she states that she does have a cardiac history and last saw her cardiologist in November

## 2020-07-04 ENCOUNTER — Telehealth: Payer: Self-pay | Admitting: Cardiology

## 2020-07-04 ENCOUNTER — Ambulatory Visit: Payer: Self-pay | Admitting: Internal Medicine

## 2020-07-04 NOTE — Telephone Encounter (Signed)
Pt called in and stated she was just seen in the ED and would like to speak to Dr Cristal Deer nurse about her blood work and levels that they took in the ed.  She is concerned about her clotting factor and would like to know what she needs to do regarding her meds and how to address it    Best number [phone]

## 2020-07-05 ENCOUNTER — Other Ambulatory Visit: Payer: Self-pay

## 2020-07-05 ENCOUNTER — Encounter: Payer: Self-pay | Admitting: Internal Medicine

## 2020-07-05 ENCOUNTER — Ambulatory Visit (HOSPITAL_COMMUNITY)
Admission: RE | Admit: 2020-07-05 | Discharge: 2020-07-05 | Disposition: A | Discharge status: Home / Self Care | Payer: Self-pay | Source: Ambulatory Visit | Attending: Internal Medicine | Admitting: Internal Medicine

## 2020-07-05 ENCOUNTER — Ambulatory Visit (INDEPENDENT_AMBULATORY_CARE_PROVIDER_SITE_OTHER): Payer: Medicaid Other | Admitting: Internal Medicine

## 2020-07-05 VITALS — BP 92/70 | HR 55 | Ht 65.0 in | Wt 215.4 lb

## 2020-07-05 DIAGNOSIS — Z86718 Personal history of other venous thrombosis and embolism: Secondary | ICD-10-CM

## 2020-07-05 DIAGNOSIS — R7989 Other specified abnormal findings of blood chemistry: Secondary | ICD-10-CM | POA: Insufficient documentation

## 2020-07-05 DIAGNOSIS — R0781 Pleurodynia: Secondary | ICD-10-CM

## 2020-07-05 MED ORDER — APIXABAN 5 MG PO TABS: 5.0000 mg | ORAL_TABLET | Freq: Two times a day (BID) | ORAL | 1 refills | Status: DC

## 2020-07-05 NOTE — Patient Instructions (Addendum)
Medication Instructions:  START: TAKE ELIQUIS 10mg  (2 TABLETS) TWICE DAILY FOR 7 DAYS.  ON DAY 8 DECREASE TO 5mg  (1 TABLET) TWICE DAILY FOR RECOMMENDED 3 MONTHS- DO NOT STOP THIS MEDICATION WITH OUT INSTRUCTION FROM YOUR CARDIOLOGIST.   3 BOXES OF SAMPLES GIVEN- LOT: EXP: 4/23 PRESCRIPTION CALLED INTO PHARMACY AS WELL- TO START AFTER SAMPLES ARE COMPLETED.   *If you need a refill on your cardiac medications before your next appointment, please call your pharmacy*  Testing/Procedures: Your physician has requested that you have a lower or upper extremity venous duplex. This test is an ultrasound of the veins in the legs or arms. It looks at venous blood flow that carries blood from the heart to the legs or arms. Allow one hour for a Lower Venous exam. Allow thirty minutes for an Upper Venous exam. There are no restrictions or special instructions.  Your physician has requested that you have an echocardiogram. Echocardiography is a painless test that uses sound waves to create images of your heart. It provides your doctor with information about the size and shape of your heart and how well your heart's chambers and valves are working. You may receive an ultrasound enhancing agent through an IV if needed to better visualize your heart during the echo.This procedure takes approximately one hour. There are no restrictions for this procedure. This will take place at the [address].   Follow-Up: At [facility], you and your health needs are our priority.  As part of our continuing mission to provide you with exceptional heart care, we have created designated Provider Care Teams.  These Care Teams include your primary Cardiologist (physician) and Advanced Practice Providers (APPs -  Physician Assistants and Nurse Practitioners) who all work together to provide you with the care you need, when you need it.  Your next appointment:   AFTER ECHO  The format for your next  appointment:   In Person  Provider:   [address], [place]

## 2020-07-05 NOTE — Addendum Note (Signed)
Addended by: Bea Laura B on: 07/05/2020 11:30 AM   Modules accepted: Orders

## 2020-07-05 NOTE — Telephone Encounter (Signed)
Pt presented to office today concerned due to never receiving a call back yesterday. Pt voiced she concerned about elevated d-dimer results from ER.  Dr. Jacques Navy (DOD) made aware and agreeable to see pt.  Appointment scheduled for today 1/28.

## 2020-07-05 NOTE — Addendum Note (Signed)
Addended by: Bea Laura B on: 07/05/2020 11:16 AM   Modules accepted: Orders

## 2020-07-05 NOTE — Progress Notes (Addendum)
Cardiology Office Note:    Date:  07/05/2020   ID:  Kristina Krause, DOB [birth year], MRN [id number]  PCP:  Grayce Sessions, NP  Cardiologist:  Jodelle Red, MD  Electrophysiologist:  None   Referring MD: Grayce Sessions, NP   Chief Complaint/Reason for Referral: Acute care visit: Right sided chest pain   She is a patient of Dr. Jodelle Red, I am seeing the patient as the DOD.  History of Present Illness:    Kristina Krause is a 50 y.o. female with a history of MI, CAD with PCI in 2016, DVT during pregnancy 29 years ago, and recent COVID-19 infection involving right-sided chest pain who presents today for recurrent right-sided chest discomfort with pleuritic component.  Same breathing pattern as when she had Covid in October 2021.  Recurrence of right sided chest discomfort, couldn't breath in all the way, and if she tried to she would would have to cough -dry cough which was similar but less severe than covid last October. Right side pain radiating to axilla and to the back. Can't breath all the way, hard to take deep breath.    MI in 2016.  She is uncertain the location of her stent, but I have independently reviewed her CT PE study from the ER 07/03/2020.  There is a metallic appearing structure in the LAD, I suspect this is where her stent has been placed.  There is also evidence of coronary calcification most likely in the circumflex and RCA distribution.  Contrast bolus timing was suboptimal on her PE study but I have independently reviewed these images and do not see a proximal filling defect.  No clear PE seen.  I have reviewed prior imaging as well demonstrating an IVC filter that is only partially visualized.  In reviewing her scan I do not see any evidence of migration of the IVC filter clearly demonstrated.  29 years ago DVT left leg with thrombectomy and IVC filter.  This occurred in the postpartum phase.  Her left leg stays swollen but she has no new  symptoms.  Past Medical History:  Diagnosis Date  . Coronary artery disease   . DVT (deep vein thrombosis) in pregnancy   . Myocardial infarct [facility])     Past Surgical History:  Procedure Laterality Date  . CORONARY STENT PLACEMENT  05/2015    Current Medications: Current Meds  Medication Sig  . albuterol (VENTOLIN HFA) 108 (90 Base) MCG/ACT inhaler Inhale 2 puffs into the lungs every 6 (six) hours as needed for wheezing or shortness of breath.  . lisinopril (ZESTRIL) 2.5 MG tablet Take 1 tablet (2.5 mg total) by mouth daily.  . metoprolol succinate (TOPROL-XL) 25 MG 24 hr tablet Take 1 tablet (25 mg total) by mouth daily.     Allergies:   Aspirin and Ibuprofen   Social History   Tobacco Use  . Smoking status: Former Smoker    Types: Cigarettes  . Smokeless tobacco: Never Used  Vaping Use  . Vaping Use: Never used  Substance Use Topics  . Alcohol use: Yes    Comment: occ  . Drug use: Never     Family History: The patient's family history includes Diabetes in her mother; Heart attack in her father and mother; Hypertension in her mother; Kidney failure in her mother.  ROS:   Please see the history of present illness.    All other systems reviewed and are negative.  EKGs/Labs/Other Studies Reviewed:    The following studies were reviewed  today:  EKG: Sinus bradycardia rate 55 bpm, septal infarct  I have independently reviewed the images from CT PE angiogram 07/03/2020.  Recent Labs: 12/25/2019: ALT 17; TSH 1.490 07/03/2020: BUN 8; Creatinine, Ser 0.74; Hemoglobin 11.8; Platelets 372; Potassium 4.1; Sodium 138  Recent Lipid Panel    Component Value Date/Time   CHOL 303 (H) 12/25/2019 1548   TRIG 283 (H) 12/25/2019 1548   HDL 55 12/25/2019 1548   CHOLHDL 5.5 (H) 12/25/2019 1548   LDLCALC 193 (H) 12/25/2019 1548    Physical Exam:    VS:  BP 92/70 (BP Location: Left Arm, Patient Position: Sitting)   Pulse (!) 55   Ht 5\' 5"  (1.651 m)   Wt 215 lb 6.4 oz (97.7  kg)   SpO2 98%   BMI 35.84 kg/m     Wt Readings from Last 5 Encounters:  07/05/20 215 lb 6.4 oz (97.7 kg)  04/26/20 218 lb 9.6 oz (99.2 kg)  02/05/20 209 lb (94.8 kg)  01/24/20 216 lb 12.8 oz (98.3 kg)  10/17/19 210 lb 6.4 oz (95.4 kg)    Constitutional: No acute distress Eyes: sclera non-icteric, normal conjunctiva and lids ENMT: normal dentition, moist mucous membranes Cardiovascular: regular rhythm, normal rate, no murmurs. S1 and S2 normal. Radial pulses normal bilaterally. No jugular venous distention.  Respiratory: clear to auscultation bilaterally GI : normal bowel sounds, soft and nontender. No distention.   MSK: extremities warm, well perfused. No edema.  NEURO: grossly nonfocal exam, moves all extremities. PSYCH: alert and oriented x 3, normal mood and affect.   ASSESSMENT:    1. History of DVT (deep vein thrombosis)   2. Positive D dimer   3. Pleuritic chest pain    PLAN:    History of DVT (deep vein thrombosis) - Plan: VAS 12/17/19 LOWER EXTREMITY VENOUS (DVT)  Positive D dimer - Plan: VAS [place] LOWER EXTREMITY VENOUS (DVT)  Pleuritic chest pain - Plan: EKG 12-Lead, ECHOCARDIOGRAM COMPLETE   She is having pleuritic chest discomfort similar to when she had Covid in October 2021.  She also has a nonnegative D-dimer.  She is quite concerned about this.  Though she is having no new lower extremity symptoms, with her history and nonnegative D-dimer, she is requesting further testing.  It would be reasonable to perform a lower extremity Doppler to exclude DVT.  In addition with her right-sided pleuritic chest pain, would be reasonable to obtain an echocardiogram which we do not yet have in our system to evaluate pericardium and overall cardiovascular function.  She recently had a Lexiscan Myoview with a preserved ejection fraction.  Follow-up with Dr. November 2021 after echocardiogram is complete.  We will be in contact with the patient regarding her DVT ultrasound which is being  performed in the office today acutely.  ADDENDUM: Patient is found to have a age-indeterminate nonocclusive deep venous thrombosis in the distal SFV.  Report interpreted and finalized by Dr. Cristal Deer.  Recommend initiation of anticoagulation, discussed in detail with the patient.  She has tried Eliquis before and had chest pain while using.  We discussed alternative options including Xarelto, warfarin, and Lovenox injections.  She is not keen to take warfarin due to frequent monitoring and may find Xarelto and Lovenox cost prohibitive.  We will trial Eliquis and she can follow-up with Dr. Eden Emms regarding any symptoms that occur while on Eliquis.  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  No clear provoking factor leading up to this DVT.  I suspect there may be more investigation needed as to the etiology of her DVTs, and she may require indefinite anticoagulation.  Consider hematology or vascular consultation.  Weston Brass, MD [place]  [facility]    Medication Adjustments/Labs and Tests Ordered: Current medicines are reviewed at length with the patient today.  Concerns regarding medicines are outlined above.   Orders Placed This Encounter  Procedures  . EKG 12-Lead  . ECHOCARDIOGRAM COMPLETE  . VAS [place] LOWER EXTREMITY VENOUS (DVT)    No orders of the defined types were placed in this encounter.   Patient Instructions  Medication Instructions:  No Changes In Medications at this time.  *If you need a refill on your cardiac medications before your next appointment, please call your pharmacy*  Testing/Procedures: Your physician has requested that you have a lower or upper extremity venous duplex. This test is an ultrasound of the veins in the legs or arms. It looks at venous blood flow that carries blood from the heart to the legs or arms. Allow one hour for a Lower Venous exam. Allow thirty minutes for an Upper Venous exam. There are no restrictions or  special instructions.  Your physician has requested that you have an echocardiogram. Echocardiography is a painless test that uses sound waves to create images of your heart. It provides your doctor with information about the size and shape of your heart and how well your heart's chambers and valves are working. You may receive an ultrasound enhancing agent through an IV if needed to better visualize your heart during the echo.This procedure takes approximately one hour. There are no restrictions for this procedure. This will take place at the [address], [address].   Follow-Up: At [facility], you and your health needs are our priority.  As part of our continuing mission to provide you with exceptional heart care, we have created designated Provider Care Teams.  These Care Teams include your primary Cardiologist (physician) and Advanced Practice Providers (APPs -  Physician Assistants and Nurse Practitioners) who all work together to provide you with the care you need, when you need it.  Your next appointment:   AFTER ECHO  The format for your next appointment:   In Person  Provider:   Jodelle Red, MD

## 2020-07-25 ENCOUNTER — Ambulatory Visit (HOSPITAL_COMMUNITY): Payer: Self-pay | Attending: Internal Medicine

## 2020-07-25 ENCOUNTER — Other Ambulatory Visit: Payer: Self-pay

## 2020-07-25 DIAGNOSIS — R0781 Pleurodynia: Secondary | ICD-10-CM | POA: Insufficient documentation

## 2020-07-25 LAB — ECHOCARDIOGRAM COMPLETE
Area-P 1/2: 3.06 cm2
S' Lateral: 3.3 cm

## 2020-08-01 ENCOUNTER — Ambulatory Visit (INDEPENDENT_AMBULATORY_CARE_PROVIDER_SITE_OTHER): Payer: Medicaid Other | Admitting: Cardiology

## 2020-08-01 ENCOUNTER — Other Ambulatory Visit: Payer: Self-pay

## 2020-08-01 ENCOUNTER — Encounter: Payer: Self-pay | Admitting: Cardiology

## 2020-08-01 VITALS — BP 100/60 | HR 68 | Ht 65.0 in | Wt 213.6 lb

## 2020-08-01 DIAGNOSIS — I2510 Atherosclerotic heart disease of native coronary artery without angina pectoris: Secondary | ICD-10-CM

## 2020-08-01 DIAGNOSIS — I82409 Acute embolism and thrombosis of unspecified deep veins of unspecified lower extremity: Secondary | ICD-10-CM

## 2020-08-01 DIAGNOSIS — Z7189 Other specified counseling: Secondary | ICD-10-CM

## 2020-08-01 DIAGNOSIS — Z79899 Other long term (current) drug therapy: Secondary | ICD-10-CM

## 2020-08-01 DIAGNOSIS — E782 Mixed hyperlipidemia: Secondary | ICD-10-CM

## 2020-08-01 MED ORDER — ROSUVASTATIN CALCIUM 5 MG PO TABS: 5.0000 mg | ORAL_TABLET | Freq: Every day | ORAL | 0 refills | Status: DC

## 2020-08-01 NOTE — Progress Notes (Signed)
Cardiology Office Note:    Date:  08/01/2020   ID:  Kristina Krause, DOB 22-Sep-1970, MRN [id number]  PCP:  [name], NP  Cardiologist:  Buford Dresser, MD  Referring MD: [name], NP   CC: follow up  History of Present Illness:    Kristina Krause is a 50 y.o. 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 I initially met her 06/12/19 via televisit for chest pain.  Cardiac history: MI treated in 2016 at [facility] in [place] (now [place]. [place] Had [place] 06/2019 for atypical chest pain--low risk, normal EF, small scar but no ischemia.   Today: Seen by my colleague Dr. Margaretann Loveless for urgent visit 07/05/20. Found to have age indeterminate DVT on vascular ultrasounds 1/28. Had negative CTPE 1/26. Started on apixaban. Echo 2/17 showed normal LEF, normal RV function, normal PASP. There is aortic sclerosis without stenosis.  She reports that she had an episode of chest tightness, but this resolved. She has only been taking the apixaban once a day--discussed that this is not adequate for thromboembolism. Gave samples today, she will increase to twice a day. No melena/hematochezia.  She had leg tightness with prior statin, left more than right. Worst after 1-2 weeks. Believes this was on atorvastatin 40 mg. Discussed rosuvastatin as alternative, see below. No longer on aspirin since she started apixaban.  Denies shortness of breath at rest or with normal exertion. No PND, orthopnea, LE edema or unexpected weight gain. No syncope or palpitations.  Past Medical History:  Diagnosis Date  . Coronary artery disease   . DVT (deep vein thrombosis) in pregnancy   . Myocardial infarct [facility])     Past Surgical History:  Procedure Laterality Date  . CORONARY STENT PLACEMENT  05/2015    Current Medications: Current Outpatient Medications on File Prior to  Visit  Medication Sig  . albuterol (VENTOLIN HFA) 108 (90 Base) MCG/ACT inhaler Inhale 2 puffs into the lungs every 6 (six) hours as needed for wheezing or shortness of breath.  Marland Kitchen apixaban (ELIQUIS) 5 MG TABS tablet Take 1 tablet (5 mg total) by mouth 2 (two) times daily.  Marland Kitchen aspirin EC 81 MG tablet Take 81 mg by mouth daily. Swallow whole.  . lisinopril (ZESTRIL) 2.5 MG tablet Take 1 tablet (2.5 mg total) by mouth daily.  . metoprolol succinate (TOPROL-XL) 25 MG 24 hr tablet Take 1 tablet (25 mg total) by mouth daily.   No current facility-administered medications on file prior to visit.     Allergies:   Aspirin and Ibuprofen   Social History   Tobacco Use  . Smoking status: Former Smoker    Types: Cigarettes  . Smokeless tobacco: Never Used  Vaping Use  . Vaping Use: Never used  Substance Use Topics  . Alcohol use: Yes    Comment: occ  . Drug use: Never    Family History: Mother and father each had MI later in life. Father died age 86, mother is living in her 67s  ROS:   Please see the history of present illness.  Additional pertinent ROS otherwise unremarkable    EKGs/Labs/Other Studies Reviewed:    The following studies were reviewed today: Echo 07/25/20 1. Left ventricular ejection fraction, by estimation, is 55 to 60%. The  left ventricle has normal function. The left ventricle has no regional  wall motion abnormalities. Left  ventricular diastolic parameters were  normal.  2. Right ventricular systolic function is normal. The right ventricular  size is normal. There is normal pulmonary artery systolic pressure.  3. The mitral valve is normal in structure. Trivial mitral valve  regurgitation.  4. The aortic valve is abnormal. Aortic valve regurgitation is not  visualized. Mild to moderate aortic valve sclerosis/calcification is  present, without any evidence of aortic stenosis.  5. The inferior vena cava is normal in size with greater than 50%  respiratory  variability, suggesting right atrial pressure of 3 mmHg.   Vasc u/s 07/05/20 Summary:  RIGHT:  - No evidence of deep vein thrombosis in the lower extremity. No indirect  evidence of obstruction proximal to the inguinal ligament.    LEFT:  - Findings consistent with age indeterminate non-occlusive deep vein  thrombosis in the distal SFV.   Zio 12/18/19 ~3.5 days of data recorded on Zio monitor. Patient had a min HR of 47 bpm, max HR of 138 bpm, and avg HR of 71 bpm. Predominant underlying rhythm was Sinus Rhythm. No SVT, atrial fibrillation, high degree block, or pauses noted. There was one 6 beat run of an unclear rhythm; does not appear consistent with VT (as per label) but possibly SVT with aberrancy.  Isolated atrial and ventricular ectopy was rare (<1%). There were 8 triggered events, which were sinus rhythm. Some of these events had single beats of ectopy (either PAC or PVC). No significant arrhythmias detected.  Nuclear stress 06/29/19  The left ventricular ejection fraction is normal (55-65%).  Nuclear stress EF: 57%. Mild hypokinesis of the septal wall region  There was no ST segment deviation noted during stress.  Findings consistent with prior myocardial infarction.  This is an intermediate risk study based upon prior anteroseptal infarct pattern. However, there is no ischemia identified.  EKG:  EKG is personally reviewed.  The ekg ordered 07/05/20 demonstrates sinus bradycardia  Recent Labs: 12/25/2019: ALT 17; TSH 1.490 07/03/2020: BUN 8; Creatinine, Ser 0.74; Hemoglobin 11.8; Platelets 372; Potassium 4.1; Sodium 138  Recent Lipid Panel    Component Value Date/Time   CHOL 303 (H) 12/25/2019 1548   TRIG 283 (H) 12/25/2019 1548   HDL 55 12/25/2019 1548   CHOLHDL 5.5 (H) 12/25/2019 1548   LDLCALC 193 (H) 12/25/2019 1548    Physical Exam:    VS:  BP 100/60   Pulse 68   Ht 5' 5"  (1.651 m)   Wt 213 lb 9.6 oz (96.9 kg)   SpO2 99%   BMI 35.54 kg/m     Wt Readings  from Last 3 Encounters:  08/01/20 213 lb 9.6 oz (96.9 kg)  07/05/20 215 lb 6.4 oz (97.7 kg)  04/26/20 218 lb 9.6 oz (99.2 kg)    GEN: Well nourished, well developed in no acute distress HEENT: Normal, moist mucous membranes NECK: No JVD CARDIAC: regular rhythm, normal S1 and S2, no rubs or gallops. No murmur. VASCULAR: Radial and DP pulses 2+ bilaterally. No carotid bruits RESPIRATORY:  Clear to auscultation without rales, wheezing or rhonchi  ABDOMEN: Soft, non-tender, non-distended MUSCULOSKELETAL:  Ambulates independently SKIN: Warm and dry, no edema NEUROLOGIC:  Alert and oriented x 3. No focal neuro deficits noted. PSYCHIATRIC:  Normal affect   ASSESSMENT:    1. Recurrent deep vein thrombosis (DVT) ([place])   2. Medication management   3. Coronary artery disease involving native coronary artery of native heart without angina pectoris   4. Mixed hyperlipidemia   5. Counseling on health  promotion and disease prevention    PLAN:    Recurrent DVT: -with two events, most recent unprovoked, would consider lifelong anticoagulation -would recommend referral to hematology for further evaluation if it would change management or assist with consideration of genetic risk -was only taking apixaban once daily. Discussed importance of BID dosing. Given samples today  History of CAD with prior MI and PCI in [place]:  -no angina -lexiscan without ischemia 06/2019 -continue aspirin 81 mg -on metoprolol and lisinopril. Unclear what her EF was at the time of her MI, records would be helpful. EF on nuclear stress 57% -statin as below -counseled on red flag warning signs that need immediate medical attention  Mixed hyperlipidemia -we discussed at length today. She is willing to trial low dose rosuvastatin 5 mg daily, ramp to 10 mg after 1 mos. Then we will recheck lipids, may need to continue to increase rosuvastatin dose to get LDL <70  Cardiac risk counseling and prevention  recommendations: Secondary prevention given known history, also with family history of heart disease. -recommend heart healthy/Mediterranean diet, with whole grains, fruits, vegetable, fish, lean meats, nuts, and olive oil. Limit salt. -recommend moderate walking, 3-5 times/week for 30-50 minutes each session. Aim for at least 150 minutes.week. Goal should be pace of 3 miles/hours, or walking 1.5 miles in 30 minutes -recommend avoidance of tobacco products. Avoid excess alcohol.  Plan for follow up: 6 mos or sooner as needed  Buford Dresser, MD, PhD [place]  [facility]    Medication Adjustments/Labs and Tests Ordered: Current medicines are reviewed at length with the patient today.  Concerns regarding medicines are outlined above.  Orders Placed This Encounter  Procedures  . Lipid panel  . Hepatic function panel   Meds ordered this encounter  Medications  . rosuvastatin (CRESTOR) 5 MG tablet    Sig: Take 1 tablet (5 mg total) by mouth daily.    Dispense:  90 tablet    Refill:  0    Patient Instructions  Medication Instructions:  Take Rosuvastatin 5 mg daily for a month. Then if you are able tolerate it, increase to 10 mg for a month. After that,  if no issues, call office and we will send in a new prescription for 10 mg daily.    *If you need a refill on your cardiac medications before your next appointment, please call your pharmacy*   Lab Work: Your physician recommends that you return for lab work in 4 months (Lipid, LFT).  If you have labs (blood work) drawn today and your tests are completely normal, you will receive your results only by: Marland Kitchen MyChart Message (if you have MyChart) OR . A paper copy in the mail If you have any lab test that is abnormal or we need to change your treatment, we will call you to review the results.   Testing/Procedures: None   Follow-Up: At [facility], you and your health needs are our priority.  As part of our  continuing mission to provide you with exceptional heart care, we have created designated Provider Care Teams.  These Care Teams include your primary Cardiologist (physician) and Advanced Practice Providers (APPs -  Physician Assistants and Nurse Practitioners) who all work together to provide you with the care you need, when you need it.  We recommend signing up for the patient portal called "MyChart".  Sign up information is provided on this After Visit Summary.  MyChart is used to connect with patients for Virtual Visits (  Telemedicine).  Patients are able to view lab/test results, encounter notes, upcoming appointments, etc.  Non-urgent messages can be sent to your provider as well.   To learn more about what you can do with MyChart, go to [email or web address].    Your next appointment:   6 month(s)  The format for your next appointment:   In Person  Provider:   Buford Dresser, MD       Signed, Buford Dresser, MD PhD 08/01/2020     [place]

## 2020-08-01 NOTE — Patient Instructions (Signed)
Medication Instructions:  Take Rosuvastatin 5 mg daily for a month. Then if you are able tolerate it, increase to 10 mg for a month. After that,  if no issues, call office and we will send in a new prescription for 10 mg daily.    *If you need a refill on your cardiac medications before your next appointment, please call your pharmacy*   Lab Work: Your physician recommends that you return for lab work in 4 months (Lipid, LFT).  If you have labs (blood work) drawn today and your tests are completely normal, you will receive your results only by: Marland Kitchen MyChart Message (if you have MyChart) OR . A paper copy in the mail If you have any lab test that is abnormal or we need to change your treatment, we will call you to review the results.   Testing/Procedures: None   Follow-Up: At [facility], you and your health needs are our priority.  As part of our continuing mission to provide you with exceptional heart care, we have created designated Provider Care Teams.  These Care Teams include your primary Cardiologist (physician) and Advanced Practice Providers (APPs -  Physician Assistants and Nurse Practitioners) who all work together to provide you with the care you need, when you need it.  We recommend signing up for the patient portal called "MyChart".  Sign up information is provided on this After Visit Summary.  MyChart is used to connect with patients for Virtual Visits (Telemedicine).  Patients are able to view lab/test results, encounter notes, upcoming appointments, etc.  Non-urgent messages can be sent to your provider as well.   To learn more about what you can do with MyChart, go to [email or web address].    Your next appointment:   6 month(s)  The format for your next appointment:   In Person  Provider:   Jodelle Red, MD

## 2020-08-04 ENCOUNTER — Encounter: Payer: Self-pay | Admitting: Cardiology

## 2020-08-18 ENCOUNTER — Encounter: Payer: Self-pay | Admitting: Cardiology

## 2020-08-23 ENCOUNTER — Ambulatory Visit: Payer: Medicaid Other | Admitting: Cardiology

## 2020-09-16 ENCOUNTER — Telehealth (INDEPENDENT_AMBULATORY_CARE_PROVIDER_SITE_OTHER): Payer: Self-pay | Admitting: Primary Care

## 2020-09-16 NOTE — Telephone Encounter (Signed)
Pt is calling and needs on our letter head a companion letter to give to her apartment complex. Pt has anxiety and needs the letter to keep her cat and this will also waive the pet fee . Pt would like the letter to be put in her mychart. Pt has deadline this Wednesday 09-18-2020. Please advice

## 2020-09-19 ENCOUNTER — Telehealth: Payer: Self-pay | Admitting: Cardiology

## 2020-09-19 NOTE — Telephone Encounter (Signed)
Pt called questioning if Dr. Cristal Deer would be ok with writing a letter stating it is medically necessary that she keep her cat for emotional support. Pt state she lives in an apartment complex and they are threatening to evict if she doesn't get rid of her cat. Pt report she has a history of panic disorders and the cat helps keep her calm.  Nurse advised pt to contact pcp but pt state Dr. Cristal Deer is more aware of her situation.  Will forward to MD

## 2020-09-19 NOTE — Telephone Encounter (Signed)
Left message informing patient that PCP is unable to provide letter as anxiety has never been discussed and there is no diagnosis in her chart. Schedule appointment to discuss with PCP or return call to [facility] at [phone] with questions or concerns.

## 2020-09-19 NOTE — Telephone Encounter (Signed)
Pt needs to speak with Dr. Di Kindle assistant, pt states it's very personal and she would rather speak with the assistant rather than tell me. Please advise

## 2020-09-26 ENCOUNTER — Telehealth (INDEPENDENT_AMBULATORY_CARE_PROVIDER_SITE_OTHER): Payer: Medicaid Other | Admitting: Primary Care

## 2020-09-26 DIAGNOSIS — F411 Generalized anxiety disorder: Secondary | ICD-10-CM

## 2020-09-29 ENCOUNTER — Encounter (INDEPENDENT_AMBULATORY_CARE_PROVIDER_SITE_OTHER): Payer: Self-pay | Admitting: Primary Care

## 2020-09-29 NOTE — Progress Notes (Signed)
Virtual Visit via Telephone Note  I connected with Kristina Krause, on 09/29/2020 at 9:34 PM by telephone due to the COVID-19 pandemic and verified that I am speaking with the correct person using two identifiers.   Consent: I discussed the limitations, risks, security and privacy concerns of performing an evaluation and management service by telephone and the availability of in person appointments. I also discussed with the patient that there may be a patient responsible charge related to this service. The patient expressed understanding and agreed to proceed.   Location of Patient: Home  Location of Provider: [facility]   Persons participating in Telemedicine visit: Jolaine Click,  NP Cristela Felt , CMA  History of Present Illness: Kristina Krause is a 50 year old female that has become emotionally attached with her cat.  She did not start explaining her many trips to the emergency room for chest pain is due to anxiety and depression.  She states this has been a ongoing problem prior to moving to [place].  There is no diagnosis or problem mentioned but asked if she was able to get her previous records indicating these problems.  I would not be able to address the need for a support animal.  She states her cardiologist is aware of this.  Asked her to also contact cardiology that is more familiar with the cause of the chest pains being panic attack anxiety and depression.  If she is unable to get a letter stating the cat helps her with her anxiety and panic attacks she will have to pay $300 that she does not have or be evicted. Past Medical History:  Diagnosis Date  . Coronary artery disease   . DVT (deep vein thrombosis) in pregnancy   . Myocardial infarct (HCC)    Allergies  Allergen Reactions  . Aspirin Nausea Only    Pt reports anything above 81 mg causes stomach aches    . Ibuprofen Other (See Comments)     Cramping, if takes more than 800 mg    Current Outpatient Medications on File Prior to Visit  Medication Sig Dispense Refill  . albuterol (VENTOLIN HFA) 108 (90 Base) MCG/ACT inhaler Inhale 2 puffs into the lungs every 6 (six) hours as needed for wheezing or shortness of breath. 8 g 0  . apixaban (ELIQUIS) 5 MG TABS tablet Take 1 tablet (5 mg total) by mouth 2 (two) times daily. 60 tablet 1  . aspirin EC 81 MG tablet Take 81 mg by mouth daily. Swallow whole.    . lisinopril (ZESTRIL) 2.5 MG tablet Take 1 tablet (2.5 mg total) by mouth daily. 30 tablet 11  . metoprolol succinate (TOPROL-XL) 25 MG 24 hr tablet Take 1 tablet (25 mg total) by mouth daily. 30 tablet 11  . rosuvastatin (CRESTOR) 5 MG tablet Take 1 tablet (5 mg total) by mouth daily. 90 tablet 0   No current facility-administered medications on file prior to visit.    Observations/Objective: There were no vitals taken for this visit. Depression and generalized anxiety which causes panic attacks and heart palpitations.  Assessment and Plan: Diagnoses and all orders for this visit:  Generalized anxiety disorder Managed with a cat that she has been using for a support animal.  Not on any medications for this problem.  Will refer to behavioral health for evaluation.   Follow Up Instructions:  I discussed the assessment and treatment plan with the patient. The patient was provided an opportunity  to ask questions and all were answered. The patient agreed with the plan and demonstrated an understanding of the instructions.   The patient was advised to call back or seek an in-person evaluation if the symptoms worsen or if the condition fails to improve as anticipated.     I provided 20 minutes total of non-face-to-face time during this encounter including median intraservice time, reviewing previous notes, investigations, ordering medications, medical decision making, coordinating care and patient verbalized understanding at the  end of the visit.     Grayce Sessions, NP. 09/29/2020, 9:34 PM

## 2020-10-02 ENCOUNTER — Encounter (INDEPENDENT_AMBULATORY_CARE_PROVIDER_SITE_OTHER): Payer: Self-pay | Admitting: Primary Care

## 2020-10-03 ENCOUNTER — Emergency Department (HOSPITAL_COMMUNITY): Payer: Managed Care, Other (non HMO)

## 2020-10-03 ENCOUNTER — Emergency Department (HOSPITAL_COMMUNITY)
Admission: EM | Admit: 2020-10-03 | Discharge: 2020-10-03 | Disposition: A | Discharge status: Home / Self Care | Payer: Managed Care, Other (non HMO) | Attending: Emergency Medicine | Admitting: Emergency Medicine

## 2020-10-03 DIAGNOSIS — I2510 Atherosclerotic heart disease of native coronary artery without angina pectoris: Secondary | ICD-10-CM | POA: Diagnosis not present

## 2020-10-03 DIAGNOSIS — Z79899 Other long term (current) drug therapy: Secondary | ICD-10-CM | POA: Insufficient documentation

## 2020-10-03 DIAGNOSIS — Z955 Presence of coronary angioplasty implant and graft: Secondary | ICD-10-CM | POA: Insufficient documentation

## 2020-10-03 DIAGNOSIS — R519 Headache, unspecified: Secondary | ICD-10-CM | POA: Diagnosis not present

## 2020-10-03 DIAGNOSIS — Z7982 Long term (current) use of aspirin: Secondary | ICD-10-CM | POA: Diagnosis not present

## 2020-10-03 DIAGNOSIS — Z87891 Personal history of nicotine dependence: Secondary | ICD-10-CM | POA: Insufficient documentation

## 2020-10-03 DIAGNOSIS — Y92410 Unspecified street and highway as the place of occurrence of the external cause: Secondary | ICD-10-CM | POA: Diagnosis not present

## 2020-10-03 DIAGNOSIS — M542 Cervicalgia: Secondary | ICD-10-CM | POA: Insufficient documentation

## 2020-10-03 MED ORDER — ACETAMINOPHEN 500 MG PO TABS
1000.0000 mg | ORAL_TABLET | Freq: Once | ORAL | Status: AC
  Administered 2020-10-03: 1000 mg via ORAL
  Filled 2020-10-03: qty 2

## 2020-10-03 NOTE — Discharge Instructions (Signed)
Follow up with PCP if needed. Continue taking aspirin for the pain. Return to the emergency department for intractable headache, vomiting, or severe pain.
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
## 2020-10-03 NOTE — ED Notes (Signed)
Patient verbalizes understanding of discharge instructions. Opportunity for questioning and answers were provided. Armband removed by staff, pt discharged from ED ambulatory.   

## 2020-10-04 ENCOUNTER — Encounter (INDEPENDENT_AMBULATORY_CARE_PROVIDER_SITE_OTHER): Payer: Medicaid Other | Admitting: Primary Care

## 2020-10-08 ENCOUNTER — Encounter (HOSPITAL_COMMUNITY): Payer: Self-pay | Admitting: Emergency Medicine

## 2020-10-08 ENCOUNTER — Other Ambulatory Visit: Payer: Self-pay

## 2020-10-08 ENCOUNTER — Ambulatory Visit (HOSPITAL_COMMUNITY)
Admission: EM | Admit: 2020-10-08 | Discharge: 2020-10-08 | Disposition: A | Discharge status: Home / Self Care | Payer: Managed Care, Other (non HMO) | Attending: Student | Admitting: Student

## 2020-10-08 DIAGNOSIS — S060X0D Concussion without loss of consciousness, subsequent encounter: Secondary | ICD-10-CM

## 2020-10-08 DIAGNOSIS — S39012D Strain of muscle, fascia and tendon of lower back, subsequent encounter: Secondary | ICD-10-CM

## 2020-10-08 DIAGNOSIS — S161XXD Strain of muscle, fascia and tendon at neck level, subsequent encounter: Secondary | ICD-10-CM | POA: Diagnosis not present

## 2020-10-08 DIAGNOSIS — I252 Old myocardial infarction: Secondary | ICD-10-CM

## 2020-10-08 MED ORDER — TIZANIDINE HCL 2 MG PO CAPS: 2.0000 mg | ORAL_CAPSULE | Freq: Three times a day (TID) | ORAL | 0 refills | Status: DC
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
## 2020-10-08 NOTE — Discharge Instructions (Addendum)
-  Start the muscle relaxer-Zanaflex (tizanidine), up to 3 times daily for muscle spasms and pain.  This can make you drowsy, so take at bedtime or when you do not need to drive or operate machinery. -Tylenol up to 1000mg  3x daily  -Try to avoid using screens as much as possible. Rest your brain as much as you can. I provided a work note. Your headaches should slowly improve over the next few weeks.  -Go straight to ER (or call 911) if you suddenly develop the worst headache of your life, especially if this is associated with vision changes, loss of consciousness, weakness/sensation changes in one arm or one leg, shortness of breath, chest pain.
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
## 2020-10-09 ENCOUNTER — Encounter (INDEPENDENT_AMBULATORY_CARE_PROVIDER_SITE_OTHER): Payer: Self-pay | Admitting: Primary Care

## 2020-10-09 ENCOUNTER — Ambulatory Visit (INDEPENDENT_AMBULATORY_CARE_PROVIDER_SITE_OTHER): Payer: Managed Care, Other (non HMO) | Admitting: Primary Care

## 2020-10-09 DIAGNOSIS — Z09 Encounter for follow-up examination after completed treatment for conditions other than malignant neoplasm: Secondary | ICD-10-CM | POA: Diagnosis not present

## 2020-10-09 DIAGNOSIS — G44319 Acute post-traumatic headache, not intractable: Secondary | ICD-10-CM | POA: Diagnosis not present

## 2020-10-09 DIAGNOSIS — I2510 Atherosclerotic heart disease of native coronary artery without angina pectoris: Secondary | ICD-10-CM | POA: Diagnosis not present

## 2020-10-09 MED ORDER — METOPROLOL SUCCINATE ER 25 MG PO TB24: 25.0000 mg | ORAL_TABLET | Freq: Every day | ORAL | 3 refills | Status: DC

## 2020-10-09 MED ORDER — LISINOPRIL 2.5 MG PO TABS: 2.5000 mg | ORAL_TABLET | Freq: Every day | ORAL | 3 refills | Status: DC
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
## 2020-10-10 ENCOUNTER — Telehealth (INDEPENDENT_AMBULATORY_CARE_PROVIDER_SITE_OTHER): Payer: Self-pay | Admitting: Primary Care

## 2020-10-10 ENCOUNTER — Telehealth: Payer: Self-pay | Admitting: Cardiology
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
## 2020-10-10 NOTE — Telephone Encounter (Signed)
Spoke to patient she stated she would like Dr.Christopher to write a letter saying she needs to keep her cat in her apartment for emotional support.She needs letter by 10/15/20.Advised Dr.Christopher is out on leave.Stated her PCP does not want to write letter.Advised I will send message to Dr.Christopher's RN.
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
## 2020-10-11 NOTE — Telephone Encounter (Signed)
Can you take a look at this- Elease Hashimoto is out for the next week.   Will route to MD to review and give okay to write letter.

## 2020-10-11 NOTE — Telephone Encounter (Signed)
Please follow up with patient.

## 2020-10-14 NOTE — Telephone Encounter (Signed)
Left message for patient of dr christopher's recommendations.

## 2020-10-14 NOTE — Telephone Encounter (Signed)
I unfortunately am not qualified to write this letter, as I do not treat mental health issues, which is needed to comment on emotional support animals. I can only comment on cardiac issues. Dr. Cristal Deer

## 2020-10-15 ENCOUNTER — Encounter (HOSPITAL_COMMUNITY): Payer: Self-pay | Admitting: Emergency Medicine

## 2020-10-15 ENCOUNTER — Emergency Department (HOSPITAL_COMMUNITY)
Admission: EM | Admit: 2020-10-15 | Discharge: 2020-10-15 | Disposition: A | Discharge status: Home / Self Care | Payer: Managed Care, Other (non HMO) | Attending: Emergency Medicine | Admitting: Emergency Medicine

## 2020-10-15 ENCOUNTER — Telehealth (INDEPENDENT_AMBULATORY_CARE_PROVIDER_SITE_OTHER): Payer: Self-pay

## 2020-10-15 ENCOUNTER — Emergency Department (HOSPITAL_COMMUNITY): Payer: Managed Care, Other (non HMO)

## 2020-10-15 ENCOUNTER — Other Ambulatory Visit: Payer: Self-pay

## 2020-10-15 DIAGNOSIS — Z7901 Long term (current) use of anticoagulants: Secondary | ICD-10-CM | POA: Diagnosis not present

## 2020-10-15 DIAGNOSIS — Z955 Presence of coronary angioplasty implant and graft: Secondary | ICD-10-CM | POA: Insufficient documentation

## 2020-10-15 DIAGNOSIS — Y92410 Unspecified street and highway as the place of occurrence of the external cause: Secondary | ICD-10-CM | POA: Insufficient documentation

## 2020-10-15 DIAGNOSIS — S0990XA Unspecified injury of head, initial encounter: Secondary | ICD-10-CM | POA: Diagnosis present

## 2020-10-15 DIAGNOSIS — R519 Headache, unspecified: Secondary | ICD-10-CM

## 2020-10-15 DIAGNOSIS — Z79899 Other long term (current) drug therapy: Secondary | ICD-10-CM | POA: Diagnosis not present

## 2020-10-15 DIAGNOSIS — S060X0A Concussion without loss of consciousness, initial encounter: Secondary | ICD-10-CM | POA: Diagnosis not present

## 2020-10-15 DIAGNOSIS — Z7982 Long term (current) use of aspirin: Secondary | ICD-10-CM | POA: Diagnosis not present

## 2020-10-15 DIAGNOSIS — I2510 Atherosclerotic heart disease of native coronary artery without angina pectoris: Secondary | ICD-10-CM | POA: Insufficient documentation

## 2020-10-15 DIAGNOSIS — Z87891 Personal history of nicotine dependence: Secondary | ICD-10-CM | POA: Insufficient documentation

## 2020-10-15 DIAGNOSIS — Z86718 Personal history of other venous thrombosis and embolism: Secondary | ICD-10-CM | POA: Diagnosis not present

## 2020-10-15 LAB — SEDIMENTATION RATE: Sed Rate: 23 mm/hr — ABNORMAL HIGH (ref 0–22)

## 2020-10-15 LAB — CBC WITH DIFFERENTIAL/PLATELET
Abs Immature Granulocytes: 0.02 10*3/uL (ref 0.00–0.07)
Basophils Absolute: 0.1 10*3/uL (ref 0.0–0.1)
Basophils Relative: 1 %
Eosinophils Absolute: 0.1 10*3/uL (ref 0.0–0.5)
Eosinophils Relative: 2 %
HCT: 33.4 % — ABNORMAL LOW (ref 36.0–46.0)
Hemoglobin: 10.9 g/dL — ABNORMAL LOW (ref 12.0–15.0)
Immature Granulocytes: 0 %
Lymphocytes Relative: 41 %
Lymphs Abs: 2.5 10*3/uL (ref 0.7–4.0)
MCH: 29.5 pg (ref 26.0–34.0)
MCHC: 32.6 g/dL (ref 30.0–36.0)
MCV: 90.3 fL (ref 80.0–100.0)
Monocytes Absolute: 0.5 10*3/uL (ref 0.1–1.0)
Monocytes Relative: 8 %
Neutro Abs: 3 10*3/uL (ref 1.7–7.7)
Neutrophils Relative %: 48 %
Platelets: 374 10*3/uL (ref 150–400)
RBC: 3.7 MIL/uL — ABNORMAL LOW (ref 3.87–5.11)
RDW: 14.5 % (ref 11.5–15.5)
WBC: 6.3 10*3/uL (ref 4.0–10.5)
nRBC: 0 % (ref 0.0–0.2)

## 2020-10-15 LAB — COMPREHENSIVE METABOLIC PANEL
ALT: 27 U/L (ref 0–44)
AST: 23 U/L (ref 15–41)
Albumin: 3.4 g/dL — ABNORMAL LOW (ref 3.5–5.0)
Alkaline Phosphatase: 66 U/L (ref 38–126)
Anion gap: 4 — ABNORMAL LOW (ref 5–15)
BUN: 9 mg/dL (ref 6–20)
CO2: 23 mmol/L (ref 22–32)
Calcium: 8.7 mg/dL — ABNORMAL LOW (ref 8.9–10.3)
Chloride: 108 mmol/L (ref 98–111)
Creatinine, Ser: 0.63 mg/dL (ref 0.44–1.00)
GFR, Estimated: >60 mL/min (ref >60)
Glucose, Bld: 94 mg/dL (ref 70–99)
Potassium: 4 mmol/L (ref 3.5–5.1)
Sodium: 135 mmol/L (ref 135–145)
Total Bilirubin: 0.9 mg/dL (ref 0.3–1.2)
Total Protein: 6.9 g/dL (ref 6.5–8.1)

## 2020-10-15 LAB — PROTIME-INR
INR: 0.9 (ref 0.8–1.2)
Prothrombin Time: 12.3 seconds (ref 11.4–15.2)

## 2020-10-15 MED ORDER — LACTATED RINGERS IV BOLUS
1000.0000 mL | Freq: Once | INTRAVENOUS | Status: AC
  Administered 2020-10-15: 1000 mL via INTRAVENOUS

## 2020-10-15 MED ORDER — IOHEXOL 350 MG/ML SOLN
100.0000 mL | Freq: Once | INTRAVENOUS | Status: AC | PRN
  Administered 2020-10-15: 100 mL via INTRAVENOUS

## 2020-10-15 MED ORDER — DIPHENHYDRAMINE HCL 50 MG/ML IJ SOLN
25.0000 mg | Freq: Once | INTRAMUSCULAR | Status: DC
  Filled 2020-10-15: qty 1

## 2020-10-15 MED ORDER — METOCLOPRAMIDE HCL 5 MG/ML IJ SOLN
10.0000 mg | Freq: Once | INTRAMUSCULAR | Status: DC
  Filled 2020-10-15: qty 2

## 2020-10-15 NOTE — ED Notes (Signed)
Patient transported to CT 

## 2020-10-15 NOTE — Telephone Encounter (Signed)
Left message asking patient to return call to [phone]. Need more clarity.
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
## 2020-10-15 NOTE — Discharge Instructions (Signed)
1.  You had a CT angiogram done today.  This evaluated your neck and head for any signs of an aneurysm, vascular dissection or blood clot in the venous portions of the brain.  The study was normal.  Headaches may still be due to concussion.  You are being recommended to follow-up with neurology for further evaluation of persisting headache.
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
## 2020-10-15 NOTE — Telephone Encounter (Signed)
Please see updated encounter  
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
## 2020-10-15 NOTE — ED Notes (Signed)
Patient wants to hold off on medication until after scan.

## 2020-10-16 NOTE — Telephone Encounter (Signed)
Left message asking patient to return call to [facility] at [phone].  

## 2020-10-17 ENCOUNTER — Ambulatory Visit: Payer: Managed Care, Other (non HMO) | Admitting: Neurology

## 2020-10-17 ENCOUNTER — Encounter (INDEPENDENT_AMBULATORY_CARE_PROVIDER_SITE_OTHER): Payer: Self-pay | Admitting: Primary Care

## 2020-10-17 ENCOUNTER — Encounter: Payer: Self-pay | Admitting: Neurology

## 2020-10-17 ENCOUNTER — Other Ambulatory Visit: Payer: Self-pay

## 2020-10-17 DIAGNOSIS — F411 Generalized anxiety disorder: Secondary | ICD-10-CM

## 2020-10-17 DIAGNOSIS — R519 Headache, unspecified: Secondary | ICD-10-CM | POA: Diagnosis not present

## 2020-10-17 MED ORDER — BUTALBITAL-APAP-CAFFEINE 50-325-40 MG PO TABS: 1.0000 | ORAL_TABLET | Freq: Four times a day (QID) | ORAL | 3 refills | Status: DC | PRN

## 2020-10-17 MED ORDER — NORTRIPTYLINE HCL 10 MG PO CAPS
20.0000 mg | ORAL_CAPSULE | Freq: Every day | ORAL | 11 refills | Status: DC
Start: 2020-10-17 — End: 2021-08-14
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
## 2020-10-22 NOTE — Telephone Encounter (Signed)
All attempts to reach patient have been unsuccessful.

## 2020-11-06 ENCOUNTER — Telehealth (INDEPENDENT_AMBULATORY_CARE_PROVIDER_SITE_OTHER): Payer: Self-pay

## 2020-11-06 NOTE — Telephone Encounter (Signed)
As her PCP, you are familiar with this patient.  Recommendation would be to use your clinical judgment and medical decision making and inform patient accordingly.

## 2020-11-06 NOTE — Telephone Encounter (Signed)
Copied from CRM [id number]. Topic: General - Other >> Nov 05, 2020  2:47 PM Gaetana Michaelis A wrote: Reason for CRM: Patient has contacted the practice to notify that a behavioral health specialist they've been referred to is not currently seeing new patients  Patient would like to be referred to a new specialist when possible, patient would also like to know before contacting the practice they're referred to that they are undoubtedly taking new patients   Patient would like to speak with a member of administrative staff regarding their communication with the practice  Please contact to further advise and discuss when possible

## 2020-11-15 NOTE — Telephone Encounter (Signed)
Kristina Krause, can you please refer this patient of Michelle's to [facility] for therapy for Anxiety? Marcelino Duster had sent her to [facility] and she was informed they are not accepting new patients. I spoke with the patient about this.Thank you

## 2020-11-19 NOTE — Telephone Encounter (Signed)
Referral has been sent on the patients behalf to [facility] on 11/19/20.

## 2020-11-28 ENCOUNTER — Encounter (INDEPENDENT_AMBULATORY_CARE_PROVIDER_SITE_OTHER): Payer: Self-pay | Admitting: Primary Care

## 2020-11-28 ENCOUNTER — Encounter (HOSPITAL_BASED_OUTPATIENT_CLINIC_OR_DEPARTMENT_OTHER): Payer: Self-pay

## 2020-11-28 LAB — LIPID PANEL
Chol/HDL Ratio: 5.7 ratio — ABNORMAL HIGH (ref 0.0–4.4)
Cholesterol, Total: 296 mg/dL — ABNORMAL HIGH (ref 100–199)
HDL: 52 mg/dL (ref >39)
LDL Chol Calc (NIH): 222 mg/dL — ABNORMAL HIGH (ref 0–99)
Triglycerides: 123 mg/dL (ref 0–149)
VLDL Cholesterol Cal: 22 mg/dL (ref 5–40)

## 2020-11-28 LAB — HEPATIC FUNCTION PANEL
ALT: 11 IU/L (ref 0–32)
AST: 11 IU/L (ref 0–40)
Albumin: 4.2 g/dL (ref 3.8–4.8)
Alkaline Phosphatase: 77 IU/L (ref 44–121)
Bilirubin Total: 0.7 mg/dL (ref 0.0–1.2)
Bilirubin, Direct: 0.14 mg/dL (ref 0.00–0.40)
Total Protein: 7 g/dL (ref 6.0–8.5)

## 2020-11-29 NOTE — Telephone Encounter (Signed)
Attempted to contact pt.  Left message to call back.  

## 2020-12-05 ENCOUNTER — Other Ambulatory Visit: Payer: Self-pay

## 2020-12-05 ENCOUNTER — Emergency Department (HOSPITAL_COMMUNITY)
Admission: EM | Admit: 2020-12-05 | Discharge: 2020-12-05 | Disposition: A | Discharge status: Home / Self Care | Payer: Managed Care, Other (non HMO) | Attending: Emergency Medicine | Admitting: Emergency Medicine

## 2020-12-05 DIAGNOSIS — R519 Headache, unspecified: Secondary | ICD-10-CM | POA: Diagnosis not present

## 2020-12-05 DIAGNOSIS — Z5321 Procedure and treatment not carried out due to patient leaving prior to being seen by health care provider: Secondary | ICD-10-CM | POA: Insufficient documentation

## 2020-12-05 DIAGNOSIS — R42 Dizziness and giddiness: Secondary | ICD-10-CM | POA: Diagnosis not present

## 2020-12-05 NOTE — ED Triage Notes (Signed)
Pt reports headache for 2 days and had to call out of work yesterday due to pain.  Pt took aspirin and tylenol without relief.  Pt states yesterday she felt dizzy with the headache, but not today.

## 2020-12-06 ENCOUNTER — Other Ambulatory Visit (INDEPENDENT_AMBULATORY_CARE_PROVIDER_SITE_OTHER): Payer: Managed Care, Other (non HMO)

## 2020-12-10 ENCOUNTER — Telehealth: Payer: Self-pay | Admitting: Cardiology

## 2020-12-10 ENCOUNTER — Other Ambulatory Visit (INDEPENDENT_AMBULATORY_CARE_PROVIDER_SITE_OTHER): Payer: Managed Care, Other (non HMO)

## 2020-12-10 ENCOUNTER — Other Ambulatory Visit (INDEPENDENT_AMBULATORY_CARE_PROVIDER_SITE_OTHER): Payer: Self-pay | Admitting: Primary Care

## 2020-12-10 ENCOUNTER — Other Ambulatory Visit: Payer: Self-pay

## 2020-12-10 DIAGNOSIS — E782 Mixed hyperlipidemia: Secondary | ICD-10-CM

## 2020-12-10 MED ORDER — ATORVASTATIN CALCIUM 40 MG PO TABS: 40.0000 mg | ORAL_TABLET | Freq: Every day | ORAL | 3 refills | Status: DC

## 2020-12-10 NOTE — Telephone Encounter (Signed)
Pt is returning call from last week. Please advise pt further

## 2020-12-10 NOTE — Telephone Encounter (Signed)
LVM for return call. 

## 2020-12-11 ENCOUNTER — Ambulatory Visit (INDEPENDENT_AMBULATORY_CARE_PROVIDER_SITE_OTHER): Payer: 59 | Admitting: Clinical

## 2020-12-11 DIAGNOSIS — F32A Depression, unspecified: Secondary | ICD-10-CM

## 2020-12-11 DIAGNOSIS — F419 Anxiety disorder, unspecified: Secondary | ICD-10-CM
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
## 2020-12-12 NOTE — Telephone Encounter (Signed)
Spoke with pt, aware of lab results, referral placed for dr hilty. She is interested in a virtual appointment because of her work schedule.

## 2020-12-17 ENCOUNTER — Telehealth: Payer: Self-pay | Admitting: Cardiology

## 2020-12-17 NOTE — Telephone Encounter (Signed)
Routed to MD/RN  Unsure what type of lab patient is requesting -- NMR lipoprofile?

## 2020-12-17 NOTE — Telephone Encounter (Signed)
Patient is requesting an order for an advanced lipid panel.

## 2020-12-18 NOTE — Telephone Encounter (Signed)
Pt called requesting an order for NMR lipoprofile prior to appointment with Dr. Rennis Golden.  She feels 9/29 is too far out and would like to know sooner.  Pt informed that per MD's nurse, appointment could be moved up to 7/27 @ 9:30 am. Pt voiced she is unable to complete appointment on 7/27 as she is starting a new schedule. Pt would like Dr. Blanchie Dessert nurse to call if something comes available.

## 2020-12-18 NOTE — Telephone Encounter (Signed)
Left message to call back  

## 2020-12-18 NOTE — Telephone Encounter (Signed)
Patient is returning call.  °

## 2020-12-19 NOTE — Telephone Encounter (Signed)
Please see updated encounter  

## 2020-12-20 NOTE — Telephone Encounter (Signed)
LVM for patient to call back. ?

## 2020-12-25 ENCOUNTER — Ambulatory Visit (HOSPITAL_COMMUNITY): Payer: Managed Care, Other (non HMO) | Admitting: Clinical

## 2021-01-01 ENCOUNTER — Other Ambulatory Visit: Payer: Self-pay

## 2021-01-01 ENCOUNTER — Ambulatory Visit (INDEPENDENT_AMBULATORY_CARE_PROVIDER_SITE_OTHER): Payer: Managed Care, Other (non HMO) | Admitting: Internal Medicine

## 2021-01-01 ENCOUNTER — Encounter (HOSPITAL_BASED_OUTPATIENT_CLINIC_OR_DEPARTMENT_OTHER): Payer: Self-pay | Admitting: Internal Medicine

## 2021-01-01 ENCOUNTER — Telehealth: Payer: Self-pay | Admitting: Internal Medicine

## 2021-01-01 VITALS — BP 118/64 | HR 91 | Ht 65.0 in | Wt 218.0 lb

## 2021-01-01 DIAGNOSIS — E7849 Other hyperlipidemia: Secondary | ICD-10-CM

## 2021-01-01 DIAGNOSIS — Z8249 Family history of ischemic heart disease and other diseases of the circulatory system: Secondary | ICD-10-CM

## 2021-01-01 DIAGNOSIS — M791 Myalgia, unspecified site: Secondary | ICD-10-CM

## 2021-01-01 DIAGNOSIS — I2510 Atherosclerotic heart disease of native coronary artery without angina pectoris: Secondary | ICD-10-CM

## 2021-01-01 DIAGNOSIS — T466X5A Adverse effect of antihyperlipidemic and antiarteriosclerotic drugs, initial encounter: Secondary | ICD-10-CM

## 2021-01-01 DIAGNOSIS — Z9861 Coronary angioplasty status: Secondary | ICD-10-CM | POA: Diagnosis not present

## 2021-01-01 MED ORDER — ROSUVASTATIN CALCIUM 5 MG PO TABS: 5.0000 mg | ORAL_TABLET | Freq: Every day | ORAL | 3 refills | Status: AC

## 2021-01-01 NOTE — Patient Instructions (Signed)
Medication Instructions:  START crestor 5mg  daily  *If you need a refill on your cardiac medications before your next appointment, please call your pharmacy*   Lab Work: FASTING lab work in about 3 months - NMR lipoprofile, LP(a)  If you have labs (blood work) drawn today and your tests are completely normal, you will receive your results only by: MyChart Message (if you have MyChart) OR A paper copy in the mail If you have any lab test that is abnormal or we need to change your treatment, we will call you to review the results.   Testing/Procedures: Genetic Test -- results should be available in 3-4 weeks   Follow-Up: At [facility], you and your health needs are our priority.  As part of our continuing mission to provide you with exceptional heart care, we have created designated Provider Care Teams.  These Care Teams include your primary Cardiologist (physician) and Advanced Practice Providers (APPs -  Physician Assistants and Nurse Practitioners) who all work together to provide you with the care you need, when you need it.  We recommend signing up for the patient portal called "MyChart".  Sign up information is provided on this After Visit Summary.  MyChart is used to connect with patients for Virtual Visits (Telemedicine).  Patients are able to view lab/test results, encounter notes, upcoming appointments, etc.  Non-urgent messages can be sent to your provider as well.   To learn more about what you can do with MyChart, go to [facility].    Your next appointment:   3-4 month(s) - lipid clinic  The format for your next appointment:   In Person  Provider:   [email or web address] Hilty, MD   Other Instructions

## 2021-01-01 NOTE — Telephone Encounter (Signed)
Patient cheek swabbed for genetic test (FH) panel. Advised results would be available in about 3-4 weeks

## 2021-01-01 NOTE — Progress Notes (Signed)
LIPID CLINIC CONSULT NOTE  Chief Complaint:  Manage dyslipidemia  Primary Care Physician: Grayce Sessions, NP  Primary Cardiologist:  Jodelle Red, MD  HPI:  Kristina Krause is a 50 y.o. female who is being seen today for the evaluation of dyslipidemia at the request of Jodelle Red,*.  This is a pleasant 50 year old female kindly referred by Dr. Cristal Deer for evaluation management of dyslipidemia.  She has a history of coronary artery disease with prior PCI in 2016 in [place].  She also has a history of recurrent DVT initially during pregnancy and more recently developed an age-indeterminate DVT after having asymmetric swelling.  She was on Eliquis for this but discontinued it although she should continue it for an indefinite period due to current guidelines, she said she had some side effects and discontinued the medicine.  She is only currently taking lisinopril and metoprolol.  She was previously on atorvastatin but had side effects with it and was concerned about the risk of developing diabetes.  She said both of her parents were on cholesterol medicines but did not develop heart disease until her 58s or 25s, both suffering MIs.  There is high cholesterol noted in the family.  More recently her lipids and June showed total cholesterol 296, HDL 52, LDL 222 and triglycerides 537.  She says she was advised to take rosuvastatin but never was given a prescription or did not get it filled.  Subsequently she has been on no therapy.  She is interested in advanced testing including particle numbers and genetic testing for possible familial hyperlipidemia based on some of her own personal reading.  PMHx:  Past Medical History:  Diagnosis Date   Coronary artery disease    DVT (deep vein thrombosis) in pregnancy    Headache    Myocardial infarct [facility])     Past Surgical History:  Procedure Laterality Date   CORONARY STENT PLACEMENT  05/2015    FAMHx:  Family History   Problem Relation Age of Onset   Heart attack Mother    Diabetes Mother    Kidney failure Mother    Hypertension Mother    Heart attack Father     SOCHx:   reports that she has quit smoking. Her smoking use included cigarettes. She has never used smokeless tobacco. She reports current alcohol use. She reports that she does not use drugs.  ALLERGIES:  Allergies  Allergen Reactions   Aspirin Nausea Only    Pt reports anything above 81 mg causes stomach aches     Ibuprofen Other (See Comments)    Cramping, if takes more than 800 mg    ROS: Pertinent items noted in HPI and remainder of comprehensive ROS otherwise negative.  HOME MEDS: Current Outpatient Medications on File Prior to Visit  Medication Sig Dispense Refill   aspirin EC 81 MG tablet Take 81 mg by mouth daily. Swallow whole.     lisinopril (ZESTRIL) 2.5 MG tablet Take 1 tablet (2.5 mg total) by mouth daily. 30 tablet 3   metoprolol succinate (TOPROL-XL) 25 MG 24 hr tablet Take 1 tablet (25 mg total) by mouth daily. 30 tablet 3   atorvastatin (LIPITOR) 40 MG tablet Take 1 tablet (40 mg total) by mouth daily. (Patient not taking: Reported on 01/01/2021) 90 tablet 3   butalbital-acetaminophen-caffeine (FIORICET) 50-325-40 MG tablet Take 1 tablet by mouth every 6 (six) hours as needed for headache. (Patient not taking: Reported on 01/01/2021) 10 tablet 3   nortriptyline (PAMELOR) 10  MG capsule Take 2 capsules (20 mg total) by mouth at bedtime. (Patient not taking: Reported on 01/01/2021) 60 capsule 11   tizanidine (ZANAFLEX) 2 MG capsule Take 1 capsule (2 mg total) by mouth 3 (three) times daily. (Patient not taking: Reported on 01/01/2021) 21 capsule 0   trimethoprim-polymyxin b (POLYTRIM) ophthalmic solution Place 1 drop into both eyes every 4 (four) hours. (Patient not taking: Reported on 01/01/2021)     No current facility-administered medications on file prior to visit.    LABS/IMAGING: No results found for this or any  previous visit (from the past 48 hour(s)). No results found.  LIPID PANEL:    Component Value Date/Time   CHOL 296 (H) 11/26/2020 0000   TRIG 123 11/26/2020 0000   HDL 52 11/26/2020 0000   CHOLHDL 5.7 (H) 11/26/2020 0000   LDLCALC 222 (H) 11/26/2020 0000    WEIGHTS: Wt Readings from Last 3 Encounters:  01/01/21 218 lb (98.9 kg)  10/17/20 218 lb 8 oz (99.1 kg)  10/09/20 221 lb 6.4 oz (100.4 kg)    VITALS: BP 118/64   Pulse 91   Ht 5\' 5"  (1.651 m)   Wt 218 lb (98.9 kg)   SpO2 98%   BMI 36.28 kg/m   EXAM: General appearance: alert and no distress Neck: no carotid bruit, no JVD, and thyroid not enlarged, symmetric, no tenderness/mass/nodules Lungs: clear to auscultation bilaterally Heart: regular rate and rhythm, S1, S2 normal, no murmur, click, rub or gallop Abdomen: soft, non-tender; bowel sounds normal; no masses,  no organomegaly Extremities: extremities normal, atraumatic, no cyanosis or edema Pulses: 2+ and symmetric Skin: Skin color, texture, turgor normal. No rashes or lesions Neurologic: Grossly normal Psych: Pleasant  *Examination chaperoned by , RN.  EKG: N/A  ASSESSMENT: Probable familial hyperlipidemia, LDL greater than 190 Family history of coronary disease in both parents Coronary artery disease status post PCI 2016 Hypertension Statin intolerance  PLAN: 1.   Ms. 2018 has a probable familial hyperlipidemia.  Her untreated cholesterol has been well in the 200s for LDL.  Most recently to 222.  She unfortunately cannot tolerate statins in the past which caused side effects and she was concerned about the increased risk of developing diabetes.  She had previously tried both rosuvastatin and atorvastatin.  She needs greater than 50% reduction in cholesterol as well and is a good candidate for PCSK9 inhibitor.  We discussed that option or first trial with low-dose statin.  She also wants to make a change to a plant-based diet.  Ultimately she  was agreeable to trying low-dose rosuvastatin 5 mg daily.  We will then go ahead and check a lipid NMR and LP(a) in about 3 months.  At that time if she remains above target, would consider adding PCSK9 inhibitor to her regimen.  She is also interested in genetic testing today we will send off a familial hyperlipidemia panel to [facility].  She was advised that this likely should be covered based on her insurance however the maximum out-of-pocket cost should be no more than $299.  Follow-up with me in about 3 months.  Thanks again for the kind referral.  Bascom Levels, MD, [facility] [place]  [place]  [facility]  Medical Director of the Advanced Lipid Disorders &  Cardiovascular Risk Reduction Clinic Diplomate of the American Board of Clinical Lipidology Attending Cardiologist  Direct Dial: [phone]  Fax: [phone]  Website:  [email or web address] 01/01/2021, 3:58 PM

## 2021-01-03 ENCOUNTER — Telehealth: Payer: Self-pay | Admitting: Internal Medicine

## 2021-01-08 ENCOUNTER — Ambulatory Visit (HOSPITAL_COMMUNITY): Payer: Managed Care, Other (non HMO) | Admitting: Clinical

## 2021-01-10 ENCOUNTER — Ambulatory Visit (HOSPITAL_BASED_OUTPATIENT_CLINIC_OR_DEPARTMENT_OTHER): Payer: Managed Care, Other (non HMO) | Admitting: Cardiology

## 2021-01-15 ENCOUNTER — Other Ambulatory Visit: Payer: Self-pay

## 2021-01-15 DIAGNOSIS — Z79899 Other long term (current) drug therapy: Secondary | ICD-10-CM

## 2021-01-21 ENCOUNTER — Encounter: Payer: Self-pay | Admitting: Internal Medicine

## 2021-01-22 ENCOUNTER — Other Ambulatory Visit: Payer: Self-pay

## 2021-01-22 ENCOUNTER — Ambulatory Visit (HOSPITAL_COMMUNITY): Payer: Self-pay | Admitting: Clinical

## 2021-02-05 ENCOUNTER — Ambulatory Visit (HOSPITAL_COMMUNITY): Payer: Managed Care, Other (non HMO) | Admitting: Clinical

## 2021-02-09 ENCOUNTER — Emergency Department (HOSPITAL_COMMUNITY): Payer: Medicaid Other

## 2021-02-09 ENCOUNTER — Other Ambulatory Visit: Payer: Self-pay

## 2021-02-09 ENCOUNTER — Emergency Department (HOSPITAL_COMMUNITY)
Admission: EM | Admit: 2021-02-09 | Discharge: 2021-02-09 | Disposition: A | Discharge status: Home / Self Care | Payer: Medicaid Other | Attending: Emergency Medicine | Admitting: Emergency Medicine

## 2021-02-09 ENCOUNTER — Encounter (HOSPITAL_COMMUNITY): Payer: Self-pay | Admitting: Emergency Medicine

## 2021-02-09 DIAGNOSIS — Z7982 Long term (current) use of aspirin: Secondary | ICD-10-CM | POA: Insufficient documentation

## 2021-02-09 DIAGNOSIS — R0602 Shortness of breath: Secondary | ICD-10-CM | POA: Insufficient documentation

## 2021-02-09 DIAGNOSIS — R002 Palpitations: Secondary | ICD-10-CM

## 2021-02-09 DIAGNOSIS — Z87891 Personal history of nicotine dependence: Secondary | ICD-10-CM | POA: Insufficient documentation

## 2021-02-09 DIAGNOSIS — R0789 Other chest pain: Secondary | ICD-10-CM | POA: Insufficient documentation

## 2021-02-09 DIAGNOSIS — Z79899 Other long term (current) drug therapy: Secondary | ICD-10-CM | POA: Insufficient documentation

## 2021-02-09 DIAGNOSIS — I2510 Atherosclerotic heart disease of native coronary artery without angina pectoris: Secondary | ICD-10-CM | POA: Insufficient documentation

## 2021-02-09 DIAGNOSIS — R42 Dizziness and giddiness: Secondary | ICD-10-CM | POA: Insufficient documentation

## 2021-02-09 LAB — CBC
HCT: 35.6 % — ABNORMAL LOW (ref 36.0–46.0)
Hemoglobin: 11.6 g/dL — ABNORMAL LOW (ref 12.0–15.0)
MCH: 29.1 pg (ref 26.0–34.0)
MCHC: 32.6 g/dL (ref 30.0–36.0)
MCV: 89.4 fL (ref 80.0–100.0)
Platelets: 393 10*3/uL (ref 150–400)
RBC: 3.98 MIL/uL (ref 3.87–5.11)
RDW: 14.5 % (ref 11.5–15.5)
WBC: 6.2 10*3/uL (ref 4.0–10.5)
nRBC: 0 % (ref 0.0–0.2)

## 2021-02-09 LAB — BASIC METABOLIC PANEL
Anion gap: 5 (ref 5–15)
BUN: 12 mg/dL (ref 6–20)
CO2: 26 mmol/L (ref 22–32)
Calcium: 9.2 mg/dL (ref 8.9–10.3)
Chloride: 104 mmol/L (ref 98–111)
Creatinine, Ser: 0.87 mg/dL (ref 0.44–1.00)
GFR, Estimated: >60 mL/min (ref >60)
Glucose, Bld: 97 mg/dL (ref 70–99)
Potassium: 3.7 mmol/L (ref 3.5–5.1)
Sodium: 135 mmol/L (ref 135–145)

## 2021-02-09 LAB — TROPONIN I (HIGH SENSITIVITY)
Troponin I (High Sensitivity): 6 ng/L (ref <18)
Troponin I (High Sensitivity): 4 ng/L (ref <18)

## 2021-02-09 LAB — I-STAT BETA HCG BLOOD, ED (MC, WL, AP ONLY): I-stat hCG, quantitative: <5 m[IU]/mL (ref <5)

## 2021-02-09 NOTE — ED Notes (Signed)
Pt exited room and stated that she could not wait longer to discuss results and plan of care with EDP/await discharge paperwork. Pt left ED.
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
## 2021-02-09 NOTE — ED Notes (Signed)
Dr. Rhunette Croft made aware of pt leaving & is currently attempting to call pt to discuss the need for outpatient follow-up with cardiology.

## 2021-02-09 NOTE — ED Triage Notes (Signed)
Reports palpitations x 1 week that is worse today.  Reports sob and dizziness.  Denies chest pain.

## 2021-02-12 ENCOUNTER — Encounter (HOSPITAL_BASED_OUTPATIENT_CLINIC_OR_DEPARTMENT_OTHER): Payer: Self-pay

## 2021-02-12 ENCOUNTER — Other Ambulatory Visit: Payer: Self-pay

## 2021-02-12 ENCOUNTER — Ambulatory Visit (HOSPITAL_COMMUNITY): Payer: Self-pay | Admitting: Clinical

## 2021-02-21 ENCOUNTER — Ambulatory Visit (HOSPITAL_BASED_OUTPATIENT_CLINIC_OR_DEPARTMENT_OTHER): Payer: Medicaid Other | Admitting: Cardiology

## 2021-02-25 ENCOUNTER — Ambulatory Visit (HOSPITAL_BASED_OUTPATIENT_CLINIC_OR_DEPARTMENT_OTHER): Payer: Medicaid Other | Admitting: Cardiology

## 2021-02-26 ENCOUNTER — Other Ambulatory Visit: Payer: Self-pay

## 2021-02-26 ENCOUNTER — Ambulatory Visit (HOSPITAL_COMMUNITY): Payer: Self-pay | Admitting: Clinical

## 2021-02-27 ENCOUNTER — Encounter (HOSPITAL_COMMUNITY): Payer: Self-pay | Admitting: Clinical

## 2021-03-06 ENCOUNTER — Telehealth: Payer: Self-pay | Admitting: Internal Medicine

## 2021-04-11 ENCOUNTER — Ambulatory Visit (INDEPENDENT_AMBULATORY_CARE_PROVIDER_SITE_OTHER): Payer: Managed Care, Other (non HMO) | Admitting: Primary Care

## 2021-04-25 ENCOUNTER — Other Ambulatory Visit: Payer: Self-pay

## 2021-04-25 ENCOUNTER — Ambulatory Visit (INDEPENDENT_AMBULATORY_CARE_PROVIDER_SITE_OTHER): Payer: Self-pay | Admitting: Cardiology

## 2021-04-25 ENCOUNTER — Ambulatory Visit (INDEPENDENT_AMBULATORY_CARE_PROVIDER_SITE_OTHER): Payer: Medicaid Other | Admitting: Primary Care

## 2021-04-25 ENCOUNTER — Encounter (HOSPITAL_BASED_OUTPATIENT_CLINIC_OR_DEPARTMENT_OTHER): Payer: Self-pay | Admitting: Cardiology

## 2021-04-25 VITALS — BP 125/72 | HR 67 | Ht 64.0 in | Wt 216.6 lb

## 2021-04-25 DIAGNOSIS — Z9861 Coronary angioplasty status: Secondary | ICD-10-CM

## 2021-04-25 DIAGNOSIS — R002 Palpitations: Secondary | ICD-10-CM

## 2021-04-25 DIAGNOSIS — Z79899 Other long term (current) drug therapy: Secondary | ICD-10-CM

## 2021-04-25 DIAGNOSIS — Z8249 Family history of ischemic heart disease and other diseases of the circulatory system: Secondary | ICD-10-CM

## 2021-04-25 DIAGNOSIS — E7849 Other hyperlipidemia: Secondary | ICD-10-CM

## 2021-04-25 DIAGNOSIS — I2510 Atherosclerotic heart disease of native coronary artery without angina pectoris: Secondary | ICD-10-CM

## 2021-04-25 NOTE — Progress Notes (Signed)
Cardiology Office Note:    Date:  04/25/2021   ID:  [name], DOB Aug 04, 1970, MRN [id number]  PCP:  [name], NP  Cardiologist:  Buford Dresser, MD  Referring MD: [name], NP   CC: follow up  History of Present Illness:    Kristina Krause is a 50 y.o. 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 I initially met her 06/12/19 via televisit for chest pain.  Cardiac history: MI treated in 2016 at [facility] in [place] (now [place]. [place] Had [place] 06/2019 for atypical chest pain--low risk, normal EF, small scar but no ischemia.   Seen by my colleague Dr. Margaretann Loveless for urgent visit 07/05/20. Found to have age indeterminate DVT on vascular ultrasounds 1/28. Had negative CTPE 1/26. Started on apixaban. Echo 2/17 showed normal LEF, normal RV function, normal PASP. There is aortic sclerosis without stenosis.  Today: She was in the ED 02/09/2021 for palpitations. Her palpitations are becoming more frequent, but she denies any major lifestyle changes. Sometimes during her palpitations she feels pre-syncopal.  Lately her LE edema has been largely stable.  She is compliant with Crestor and has been tolerating it well.  She denies any chest pain, or shortness of breath. No lightheadedness, headaches, orthopnea, PND, or exertional symptoms.   Past Medical History:  Diagnosis Date   Coronary artery disease    DVT (deep vein thrombosis) in pregnancy    Headache    Myocardial infarct [facility])     Past Surgical History:  Procedure Laterality Date   CORONARY STENT PLACEMENT  05/2015    Current Medications: Current Outpatient Medications on File Prior to Visit  Medication Sig   aspirin EC 81 MG tablet Take 81 mg by mouth daily. Swallow whole.   butalbital-acetaminophen-caffeine (FIORICET) 50-325-40 MG tablet Take 1 tablet by mouth every 6  (six) hours as needed for headache.   lisinopril (ZESTRIL) 2.5 MG tablet Take 1 tablet (2.5 mg total) by mouth daily.   metoprolol succinate (TOPROL-XL) 25 MG 24 hr tablet Take 1 tablet (25 mg total) by mouth daily.   nortriptyline (PAMELOR) 10 MG capsule Take 2 capsules (20 mg total) by mouth at bedtime.   tizanidine (ZANAFLEX) 2 MG capsule Take 1 capsule (2 mg total) by mouth 3 (three) times daily.   trimethoprim-polymyxin b (POLYTRIM) ophthalmic solution Place 1 drop into both eyes every 4 (four) hours.   rosuvastatin (CRESTOR) 5 MG tablet Take 1 tablet (5 mg total) by mouth daily.   No current facility-administered medications on file prior to visit.     Allergies:   Aspirin and Ibuprofen   Social History   Tobacco Use   Smoking status: Former    Types: Cigarettes   Smokeless tobacco: Never  Vaping Use   Vaping Use: Never used  Substance Use Topics   Alcohol use: Yes    Comment: occ   Drug use: Never    Family History: Mother and father each had MI later in life. Father died age 79, mother is living in her 32s  ROS:   Please see the history of present illness.   (+) Palpitations (+) Pre-syncope (+) LE edema Additional pertinent ROS otherwise unremarkable    EKGs/Labs/Other Studies Reviewed:    The following studies were reviewed today:  CT Venogram Head 10/15/2020: COMPARISON:  Arterial study same day   FINDINGS: Sagittal  superior sagittal sinus is widely patent. Both transverse sinuses appear normal. Both sigmoid sinuses are normal. Flow is present in both jugular veins. Deep venous system appears normal. No evidence of superficial thrombosis.   IMPRESSION: Normal intracranial CT venography.  CTA Head/Neck 10/15/2020: COMPARISON:  10/03/2020   FINDINGS: CT HEAD FINDINGS Brain: The brain has normal appearance without evidence of atrophy, old or acute infarction, mass lesion, hemorrhage, hydrocephalus or extra-axial collection.   Vascular: No abnormal  vascular finding.   Skull: Normal   Sinuses: Clear   Orbits: Normal   Review of the MIP images confirms the above findings   CTA NECK FINDINGS Aortic arch: Aortic arch shows some atherosclerotic calcification. Question the presence of coronary artery calcification.   Right carotid system: Common carotid artery widely patent to the bifurcation. Carotid bifurcation is normal without soft or calcified plaque. Cervical ICA is normal. No sign of dissection.   Left carotid system: Common carotid artery widely patent to the bifurcation. Carotid bifurcation is normal. Cervical ICA is normal. No sign of dissection.   Vertebral arteries: Both vertebral artery origins widely patent. Both vertebral arteries appear normal through the cervical region to the foramen magnum.   Skeleton: Normal   Other neck: No mass or lymphadenopathy. No sign of soft tissue neck injury.   Upper chest: Normal   Review of the MIP images confirms the above findings   CTA HEAD FINDINGS Anterior circulation: Both internal carotid arteries are widely patent through the skull base and siphon regions. There is some siphon atherosclerotic calcification but no stenosis. The anterior and middle cerebral vessels appear patent. No large vessel occlusion. No aneurysm or vascular malformation.   Posterior circulation: Both vertebral arteries widely patent to the basilar. No basilar stenosis. Posterior circulation branch vessels appear normal.   Venous sinuses: Patent and normal.   Anatomic variants: None significant.   Review of the MIP images confirms the above findings   IMPRESSION: Aortic atherosclerosis.  Coronary artery calcification.   Carotid bifurcations appear normal.   No evidence of vascular injury affecting the carotid or vertebral vessels.   No intracranial vessel pathologic finding.  Echo 07/25/20  1. Left ventricular ejection fraction, by estimation, is 55 to 60%. The  left ventricle  has normal function. The left ventricle has no regional  wall motion abnormalities. Left ventricular diastolic parameters were  normal.   2. Right ventricular systolic function is normal. The right ventricular  size is normal. There is normal pulmonary artery systolic pressure.   3. The mitral valve is normal in structure. Trivial mitral valve  regurgitation.   4. The aortic valve is abnormal. Aortic valve regurgitation is not  visualized. Mild to moderate aortic valve sclerosis/calcification is  present, without any evidence of aortic stenosis.   5. The inferior vena cava is normal in size with greater than 50%  respiratory variability, suggesting right atrial pressure of 3 mmHg.   Vasc u/s 07/05/20 Summary:  RIGHT:  - No evidence of deep vein thrombosis in the lower extremity. No indirect  evidence of obstruction proximal to the inguinal ligament.     LEFT:  - Findings consistent with age indeterminate non-occlusive deep vein  thrombosis in the distal SFV.   Zio 12/18/19 ~3.5 days of data recorded on Zio monitor. Patient had a min HR of 47 bpm, max HR of 138 bpm, and avg HR of 71 bpm. Predominant underlying rhythm was Sinus Rhythm. No SVT, atrial fibrillation, high degree block, or pauses noted. There was one  6 beat run of an unclear rhythm; does not appear consistent with VT (as per label) but possibly SVT with aberrancy.  Isolated atrial and ventricular ectopy was rare (<1%). There were 8 triggered events, which were sinus rhythm. Some of these events had single beats of ectopy (either PAC or PVC). No significant arrhythmias detected.  Nuclear stress 06/29/19 The left ventricular ejection fraction is normal (55-65%). Nuclear stress EF: 57%. Mild hypokinesis of the septal wall region There was no ST segment deviation noted during stress. Findings consistent with prior myocardial infarction. This is an intermediate risk study based upon prior anteroseptal infarct pattern. However, there  is no ischemia identified.  EKG:  EKG is personally reviewed.   04/25/2021: SR with sinus arrhythmia at 67 bpm 07/05/20: sinus bradycardia  Recent Labs: 11/26/2020: ALT 11 02/09/2021: BUN 12; Creatinine, Ser 0.87; Hemoglobin 11.6; Platelets 393; Potassium 3.7; Sodium 135   Recent Lipid Panel    Component Value Date/Time   CHOL 296 (H) 11/26/2020 0000   TRIG 123 11/26/2020 0000   HDL 52 11/26/2020 0000   CHOLHDL 5.7 (H) 11/26/2020 0000   LDLCALC 222 (H) 11/26/2020 0000    Physical Exam:    VS:  BP 125/72   Pulse 67   Ht 5' 4"  (1.626 m)   Wt 216 lb 9.6 oz (98.2 kg)   SpO2 99%   BMI 37.18 kg/m     Wt Readings from Last 3 Encounters:  04/25/21 216 lb 9.6 oz (98.2 kg)  01/01/21 218 lb (98.9 kg)  10/17/20 218 lb 8 oz (99.1 kg)    GEN: Well nourished, well developed in no acute distress HEENT: Normal, moist mucous membranes NECK: No JVD CARDIAC: regular rhythm, normal S1 and S2, no rubs or gallops. No murmur. VASCULAR: Radial and DP pulses 2+ bilaterally. No carotid bruits RESPIRATORY:  Clear to auscultation without rales, wheezing or rhonchi  ABDOMEN: Soft, non-tender, non-distended MUSCULOSKELETAL:  Ambulates independently SKIN: Warm and dry, no edema NEUROLOGIC:  Alert and oriented x 3. No focal neuro deficits noted. PSYCHIATRIC:  Normal affect   ASSESSMENT:    1. Palpitations   2. Familial hyperlipidemia   3. Coronary artery disease involving native coronary artery of native heart without angina pectoris   4. S/P coronary angioplasty   5. Family history of heart disease   6. Medication management     PLAN:    Palpitations: -has become more frequent. We reviewed that her prior monitor did not show an association between her symptoms and arrhythmia. We discussed repeating a monitor vs. Something like a Kardiamobile -she would like to continue tracking her symptoms, will contact me with any new concerns -reviewed red flag warning signs that need immediate medical  attention  Recurrent DVT: -with two events, most recent unprovoked, would consider lifelong anticoagulation. However she elected to stop anticoagulation and has not had recurrent events.   History of CAD with prior MI and PCI in [place]:  -no angina -lexiscan without ischemia 06/2019 -continue aspirin 81 mg -on metoprolol and lisinopril. Unclear what her EF was at the time of her MI. EF on nuclear stress 57% -statin as below -counseled on red flag warning signs that need immediate medical attention  Mixed hyperlipidemia -has been on/off statin. Discussed importance of secondary prevention. Goal LDL <70, will recheck today. Encouraged her to take rosuvastatin as much as she can tolerate, otherwise we can discuss alternative therapy   Cardiac risk counseling and prevention recommendations: Secondary prevention given known history, also with  family history of heart disease. -recommend heart healthy/Mediterranean diet, with whole grains, fruits, vegetable, fish, lean meats, nuts, and olive oil. Limit salt. -recommend moderate walking, 3-5 times/week for 30-50 minutes each session. Aim for at least 150 minutes.week. Goal should be pace of 3 miles/hours, or walking 1.5 miles in 30 minutes -recommend avoidance of tobacco products. Avoid excess alcohol.  Plan for follow up: 1 year or sooner as needed  Buford Dresser, MD, PhD [place]  [facility]    Medication Adjustments/Labs and Tests Ordered: Current medicines are reviewed at length with the patient today.  Concerns regarding medicines are outlined above.   Orders Placed This Encounter  Procedures   Lipid panel   EKG 12-Lead    No orders of the defined types were placed in this encounter.   Patient Instructions  Medication Instructions:  Your Physician recommend you continue on your current medication as directed.    *If you need a refill on your cardiac medications before your next appointment, please call your  pharmacy*   Lab Work: Your provider has recommended lab work (fasting lipid). Please have this collected at [facility] [place]. The lab is open 8:00 am - 4:30 pm. Please avoid 12:00p - 1:00p for lunch hour. You do not need an appointment. Please go to [address] [place], [place] [address]. This is in the Primary Care office on the 3rd floor, let them know you are there for blood work and they will direct you to the lab.   If you have labs (blood work) drawn today and your tests are completely normal, you will receive your results only by: [place] (if you have MyChart) OR A paper copy in the mail If you have any lab test that is abnormal or we need to change your treatment, we will call you to review the results.   Testing/Procedures: None ordered today   Follow-Up: At [facility], you and your health needs are our priority.  As part of our continuing mission to provide you with exceptional heart care, we have created designated Provider Care Teams.  These Care Teams include your primary Cardiologist (physician) and Advanced Practice Providers (APPs -  Physician Assistants and Nurse Practitioners) who all work together to provide you with the care you need, when you need it.  We recommend signing up for the patient portal called "MyChart".  Sign up information is provided on this After Visit Summary.  MyChart is used to connect with patients for Virtual Visits (Telemedicine).  Patients are able to view lab/test results, encounter notes, upcoming appointments, etc.  Non-urgent messages can be sent to your provider as well.   To learn more about what you can do with MyChart, go to [email or web address].    Your next appointment:   1 year(s)  The format for your next appointment:   In Person  Provider:   Buford Dresser, MD      [facility] Stumpf,acting as a scribe for Buford Dresser, MD.,have documented all relevant  documentation on the behalf of Buford Dresser, MD,as directed by  Buford Dresser, MD while in the presence of Buford Dresser, MD.  I, Buford Dresser, MD, have reviewed all documentation for this visit. The documentation on 04/25/21 for the exam, diagnosis, procedures, and orders are all accurate and complete.   Signed, Buford Dresser, MD PhD 04/25/2021     [place]

## 2021-04-25 NOTE — Patient Instructions (Signed)
Medication Instructions:  Your Physician recommend you continue on your current medication as directed.    *If you need a refill on your cardiac medications before your next appointment, please call your pharmacy*   Lab Work: Your provider has recommended lab work (fasting lipid). Please have this collected at [facility] [place]. The lab is open 8:00 am - 4:30 pm. Please avoid 12:00p - 1:00p for lunch hour. You do not need an appointment. Please go to [address] [place], [place] [address]. This is in the Primary Care office on the 3rd floor, let them know you are there for blood work and they will direct you to the lab.  If you have labs (blood work) drawn today and your tests are completely normal, you will receive your results only by: MyChart Message (if you have MyChart) OR A paper copy in the mail If you have any lab test that is abnormal or we need to change your treatment, we will call you to review the results.   Testing/Procedures: None ordered today   Follow-Up: At [facility], you and your health needs are our priority.  As part of our continuing mission to provide you with exceptional heart care, we have created designated Provider Care Teams.  These Care Teams include your primary Cardiologist (physician) and Advanced Practice Providers (APPs -  Physician Assistants and Nurse Practitioners) who all work together to provide you with the care you need, when you need it.  We recommend signing up for the patient portal called "MyChart".  Sign up information is provided on this After Visit Summary.  MyChart is used to connect with patients for Virtual Visits (Telemedicine).  Patients are able to view lab/test results, encounter notes, upcoming appointments, etc.  Non-urgent messages can be sent to your provider as well.   To learn more about what you can do with MyChart, go to [email or web address].    Your next appointment:   1 year(s)  The  format for your next appointment:   In Person  Provider:   Bridgette Christopher, MD{          

## 2021-05-23 ENCOUNTER — Ambulatory Visit (INDEPENDENT_AMBULATORY_CARE_PROVIDER_SITE_OTHER): Payer: Medicaid Other | Admitting: Primary Care

## 2021-06-12 ENCOUNTER — Encounter (HOSPITAL_BASED_OUTPATIENT_CLINIC_OR_DEPARTMENT_OTHER): Payer: Self-pay | Admitting: Cardiology

## 2021-06-20 ENCOUNTER — Encounter (HOSPITAL_BASED_OUTPATIENT_CLINIC_OR_DEPARTMENT_OTHER): Payer: Self-pay

## 2021-06-25 ENCOUNTER — Other Ambulatory Visit: Payer: Self-pay | Admitting: Cardiology

## 2021-06-25 DIAGNOSIS — I2510 Atherosclerotic heart disease of native coronary artery without angina pectoris: Secondary | ICD-10-CM

## 2021-07-25 ENCOUNTER — Ambulatory Visit (INDEPENDENT_AMBULATORY_CARE_PROVIDER_SITE_OTHER): Payer: Medicaid Other | Admitting: Primary Care

## 2021-08-14 ENCOUNTER — Ambulatory Visit (INDEPENDENT_AMBULATORY_CARE_PROVIDER_SITE_OTHER): Payer: Self-pay | Admitting: Primary Care

## 2021-08-14 ENCOUNTER — Encounter (INDEPENDENT_AMBULATORY_CARE_PROVIDER_SITE_OTHER): Payer: Self-pay | Admitting: Primary Care

## 2021-08-14 ENCOUNTER — Other Ambulatory Visit: Payer: Self-pay

## 2021-08-14 VITALS — BP 138/69 | HR 76 | Temp 97.7°F | Ht 65.0 in | Wt 227.6 lb

## 2021-08-14 DIAGNOSIS — E559 Vitamin D deficiency, unspecified: Secondary | ICD-10-CM

## 2021-08-14 DIAGNOSIS — Z202 Contact with and (suspected) exposure to infections with a predominantly sexual mode of transmission: Secondary | ICD-10-CM

## 2021-08-14 DIAGNOSIS — N921 Excessive and frequent menstruation with irregular cycle: Secondary | ICD-10-CM

## 2021-08-14 DIAGNOSIS — Z131 Encounter for screening for diabetes mellitus: Secondary | ICD-10-CM

## 2021-08-14 DIAGNOSIS — R635 Abnormal weight gain: Secondary | ICD-10-CM

## 2021-08-14 LAB — POCT GLYCOSYLATED HEMOGLOBIN (HGB A1C): Hemoglobin A1C: 5.9 % — AB (ref 4.0–5.6)

## 2021-08-14 NOTE — Patient Instructions (Signed)
Preventing Type 2 Diabetes Mellitus °Type 2 diabetes, also called type 2 diabetes mellitus, is a long-term (chronic) disease that affects sugar (glucose) levels in your blood. Normally, a hormone called insulin allows glucose to enter cells in your body. The cells use glucose for energy. With type 2 diabetes, you will have one or both of these problems: °Your pancreas does not make enough insulin. °Cells in your body do not respond properly to insulin that your body makes (insulin resistance). °Insulin resistance or lack of insulin causes extra glucose to build up in the blood instead of going into cells. As a result, high blood glucose (hyperglycemia) develops. That can cause many complications. Being overweight or obese and having an inactive (sedentary) lifestyle can increase your risk for diabetes. Type 2 diabetes can be delayed or prevented by making certain nutrition and lifestyle changes. °How can this condition affect me? °If you do not take steps to prevent diabetes, your blood glucose levels may keep increasing over time. Too much glucose in your blood for a long time can damage your blood vessels, heart, kidneys, nerves, and eyes. °Type 2 diabetes can lead to chronic health problems and complications, such as: °Heart disease. °Stroke. °Blindness. °Kidney disease. °Depression. °Poor circulation in your feet and legs. In severe cases, a foot or leg may need to be surgically removed (amputated). °What can increase my risk? °You may be more likely to develop type 2 diabetes if you: °Have type 2 diabetes in your family. °Are overweight or obese. °Have a sedentary lifestyle. °Have insulin resistance or a history of prediabetes. °Have a history of pregnancy-related (gestational) diabetes or polycystic ovary syndrome (PCOS). °What actions can I take to prevent this? °It can be difficult to recognize signs of type 2 diabetes. Taking action to prevent the disease before you develop symptoms is the best way to avoid  possible damage to your body. Making certain nutrition and lifestyle changes may prevent or delay the disease and related health problems. °Nutrition ° °Eat healthy meals and snacks regularly. Do not skip meals. Fruit or a handful of nuts is a healthy snack between meals. °Drink water throughout the day. Avoid drinks that contain added sugar, such as soda or sweetened tea. Drink enough fluid to keep your urine pale yellow. °Follow instructions from your health care provider about eating or drinking restrictions. °Limit the amount of food you eat by: °Managing how much you eat at a time (portion size). °Checking food labels for the serving sizes of food. °Using a kitchen scale to weigh amounts of food. °Sauté or steam food instead of frying it. Cook with water or broth instead of oils or butter. °Limit saturated fat and salt (sodium) in your diet. Have no more than 1 tsp (2,400 mg) of sodium a day. If you have heart disease or high blood pressure, use less than ½?¾ tsp (1,500 mg) of sodium a day. °Lifestyle ° °Lose weight if needed and as told. Your health care provider can determine how much weight loss is best for you and can help you lose weight safely. °If you are overweight or obese, you may be told to lose at least 5?7% of your body weight. °Manage blood pressure, cholesterol, and stress. Your health care provider will help determine the best treatment for you. °Do not use any products that contain nicotine or tobacco. These products include cigarettes, chewing tobacco, and vaping devices, such as e-cigarettes. If you need help quitting, ask your health care provider. °Activity ° °Do physical   activity that makes your heart beat faster and makes you sweat (moderate intensity). Do this for at least 30 minutes on at least 5 days of the week, or as much as told by your health care provider. °Ask your health care provider what activities are safe for you. A mix of activities may be best, such as walking, swimming,  cycling, and strength training. °Try to add physical activity into your day. For example: °Park your car farther away than usual so that you walk more. °Take a walk during your lunch break. °Use stairs instead of elevators or escalators. °Walk or bike to work instead of driving. °Alcohol use °If you drink alcohol: °Limit how much you have to: °0?1 drink a day for women who are not pregnant. °0?2 drinks a day for men. °Know how much alcohol is in your drink. In the [place] one drink equals one 12 oz bottle of beer (355 mL), one 5 oz glass of wine (148 mL), or one 1½ oz glass of hard liquor (44 mL). °General information °Talk with your health care provider about your risk factors and how you can reduce your risk for diabetes. °Have your blood glucose tested regularly, as told by your health care provider. °Get screening tests as told by your health care provider. You may have these regularly, especially if you have certain risk factors for type 2 diabetes. °Make an appointment with a registered dietitian. This diet and nutrition specialist can help you make a healthy eating plan and help you understand portion sizes and food labels. °Where to find support °Ask your health care provider to recommend a registered dietitian, a certified diabetes care and education specialist, or a weight loss program. °Look for local or online weight loss groups. °Join a gym, fitness club, or outdoor activity group, such as a walking club. °Where to find more information °For help and guidance and to learn more about diabetes and diabetes prevention, visit: °American Diabetes Association (ADA): www.diabetes.org °National Institute of Diabetes and Digestive and Kidney Diseases: www.niddk.nih.gov °To learn more about healthy eating, visit: °U.S. Department of Agriculture (USDA): www.choosemyplate.gov °Office of Disease Prevention and Health Promotion (ODPHP): health.gov °Summary °You can delay or prevent type 2 diabetes by eating healthy  foods, losing weight if needed, and increasing your physical activity. °Talk with your health care provider about your risk factors for type 2 diabetes and how you can reduce your risk. °It can be difficult to recognize the signs of type 2 diabetes. The best way to avoid possible damage to your body is to take action to prevent the disease before you develop symptoms. °Get screening tests as told by your health care provider. °This information is not intended to replace advice given to you by your health care provider. Make sure you discuss any questions you have with your health care provider. °Document Revised: 08/19/2020 Document Reviewed: 08/19/2020 °Elsevier Patient Education © 2022 [employer] ° °

## 2021-08-15 LAB — CBC WITH DIFFERENTIAL/PLATELET
Basophils Absolute: 0 10*3/uL (ref 0.0–0.2)
Basos: 1 %
EOS (ABSOLUTE): 0 10*3/uL (ref 0.0–0.4)
Eos: 1 %
Hematocrit: 41.1 % (ref 34.0–46.6)
Hemoglobin: 14.2 g/dL (ref 11.1–15.9)
Immature Grans (Abs): 0 10*3/uL (ref 0.0–0.1)
Immature Granulocytes: 0 %
Lymphocytes Absolute: 2.1 10*3/uL (ref 0.7–3.1)
Lymphs: 41 %
MCH: 31.3 pg (ref 26.6–33.0)
MCHC: 34.5 g/dL (ref 31.5–35.7)
MCV: 91 fL (ref 79–97)
Monocytes Absolute: 0.5 10*3/uL (ref 0.1–0.9)
Monocytes: 9 %
Neutrophils Absolute: 2.5 10*3/uL (ref 1.4–7.0)
Neutrophils: 48 %
Platelets: 243 10*3/uL (ref 150–450)
RBC: 4.53 x10E6/uL (ref 3.77–5.28)
RDW: 13.1 % (ref 11.7–15.4)
WBC: 5.2 10*3/uL (ref 3.4–10.8)

## 2021-08-15 LAB — VITAMIN D 25 HYDROXY (VIT D DEFICIENCY, FRACTURES): Vit D, 25-Hydroxy: 27.6 ng/mL — ABNORMAL LOW (ref 30.0–100.0)

## 2021-08-15 LAB — TSH+FREE T4
Free T4: 1.1 ng/dL (ref 0.82–1.77)
TSH: 2.35 u[IU]/mL (ref 0.450–4.500)

## 2021-08-17 NOTE — Progress Notes (Signed)
?[facility] ? ?Kristina Krause, is a 51 y.o. female ? ?KZL:[id number] ? ?[id number]:[id number] ? ?DOB - 06-30-1970 ? ?Chief Complaint  ?Patient presents with  ? Weight Gain  ?  Patient feels that the gain is excessive and her diet is the best it has ever been   ?    ? ?Subjective:  ? ?Kristina Krause is a 51 y.o. female here today for abnormal weight gain with with significant changes in her diet . Patient has No headache, No chest pain, No abdominal pain - No Nausea, No new weakness tingling or numbness, No Cough - shortness of breath  ? ?No problems updated. ? ?Allergies  ?Allergen Reactions  ? Aspirin Nausea Only  ?  Pt reports anything above 81 mg causes stomach aches ?   ? Ibuprofen Other (See Comments)  ?  Cramping, if takes more than 800 mg  ? ? ?Past Medical History:  ?Diagnosis Date  ? Coronary artery disease   ? DVT (deep vein thrombosis) in pregnancy   ? Headache   ? Myocardial infarct [facility])   ? ? ?Current Outpatient Medications on File Prior to Visit  ?Medication Sig Dispense Refill  ? lisinopril (ZESTRIL) 2.5 MG tablet TAKE 1 TABLET(2.5 MG) BY MOUTH DAILY 30 tablet 6  ? metoprolol succinate (TOPROL-XL) 25 MG 24 hr tablet Take 1 tablet (25 mg total) by mouth daily. 30 tablet 3  ? aspirin EC 81 MG tablet Take 81 mg by mouth daily. Swallow whole.    ? rosuvastatin (CRESTOR) 5 MG tablet Take 1 tablet (5 mg total) by mouth daily. 90 tablet 3  ? trimethoprim-polymyxin b (POLYTRIM) ophthalmic solution Place 1 drop into both eyes every 4 (four) hours.    ? ?No current facility-administered medications on file prior to visit.  ? ? ?Objective:  ? ?Vitals:  ? 08/14/21 1106 08/14/21 1123  ?BP: (!) 145/99 138/69  ?Pulse: 64 76  ?Temp: 97.7 ?F (36.5 ?C)   ?TempSrc: Oral   ?SpO2: 99%   ?Weight: 227 lb 9.6 oz (103.2 kg)   ?Height: 5\' 5"  (1.651 m)   ? ? ?Exam ?General appearance : Awake, alert, not in any distress. Speech Clear. Not toxic looking ?HEENT: Atraumatic and Normocephalic, pupils equally reactive to  light and accomodation ?Neck: Supple, no JVD. No cervical lymphadenopathy.  ?Chest: Good air entry bilaterally, no added sounds  ?CVS: S1 S2 regular, no murmurs.  ?Abdomen: Bowel sounds present, Non tender and not distended with no gaurding, rigidity or rebound. ?Extremities: B/L Lower Ext shows no edema, both legs are warm to touch ?Neurology: Awake alert, and oriented X 3, CN II-XII intact, Non focal ?Skin: No Rash ? ?Data Review ?Lab Results  ?Component Value Date  ? HGBA1C 5.9 (A) 08/14/2021  ? ? ?Assessment & Plan  ? ?1. Screening for diabetes mellitus ?- HgB A1c 5.9  5.7% to 6.4% indicates prediabetes, ?New dx of pre diabetes  blood sugar levels are higher than normal, but not high enough yet to be diagnosed as type 2 diabetes.- start with lifestyle modifications and increasing excercising ? ?2. Weight gain ?- TSH + free T4 ? ?3. Menorrhagia with irregular cycle ?- CBC with Differential ? ?4. Vitamin D deficiency ?- Vitamin D, 25-hydroxy ? ?5. STD exposure ?- Cytology - non gyn ? ? ? ?Patient have been counseled extensively about nutrition and exercise. Other issues discussed during this visit include: low cholesterol diet, weight control and daily exercise, foot care, annual eye examinations at Ophthalmology, importance of adherence  with medications and regular follow-up. We also discussed long term complications of uncontrolled diabetes and hypertension.  ? ?Return in about 6 months (around 02/14/2022) for Prediabetes . ? ?The patient was given clear instructions to go to ER or return to medical center if symptoms don't improve, worsen or new problems develop. The patient verbalized understanding. The patient was told to call to get lab results if they haven't heard anything in the next week.  ? ?This note has been created with Education officer, environmental. Any transcriptional errors are unintentional.  ? ?Kristina Sessions, NP ?08/17/2021, 7:38 PM  ?

## 2021-08-18 ENCOUNTER — Encounter (HOSPITAL_BASED_OUTPATIENT_CLINIC_OR_DEPARTMENT_OTHER): Payer: Self-pay

## 2021-08-19 NOTE — Telephone Encounter (Signed)
We don't order these, but if her primary wants to order them they may be able to send from the same blood draw. But she would need orders/slips from her PCP

## 2021-08-19 NOTE — Telephone Encounter (Signed)
Please advise on lab work for upcoming appointment ?

## 2021-08-22 ENCOUNTER — Ambulatory Visit (INDEPENDENT_AMBULATORY_CARE_PROVIDER_SITE_OTHER): Payer: Medicaid Other | Admitting: Primary Care

## 2021-09-25 IMAGING — CT CT ANGIO CHEST
2 of 6 series · 18 of 36 positions shown · IV contrast (OMNIPAQUE 350)
Comparison: 03/04/2020

CLINICAL DATA: Chest pain radiating to the back. Positive D-dimer.
Pulmonary embolus is suspected with low to intermediate probability.

EXAM:
CT ANGIOGRAPHY CHEST WITH CONTRAST
TECHNIQUE: Multidetector CT imaging of the chest was performed using the
standard protocol during bolus administration of intravenous
contrast. Multiplanar CT image reconstructions and MIPs were
obtained to evaluate the vascular anatomy.
CONTRAST:  100mL OMNIPAQUE IOHEXOL 350 MG/ML SOLN

[Series 6: thins · axial · 0.56mm/px · z∈[+1357,+1549]mm · 17 of 218 slices shown]
[im 13/218  lung]
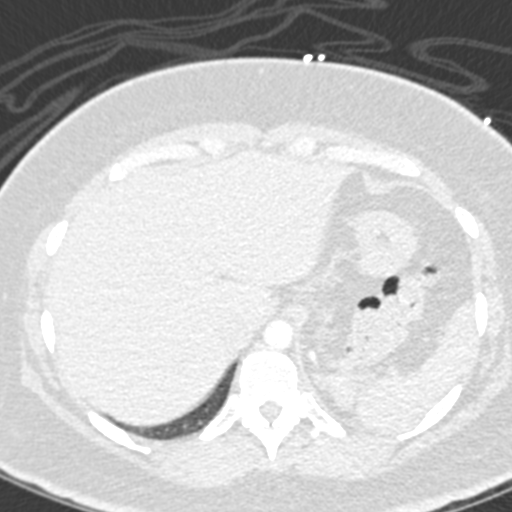
[im 25/218  mediastinal]
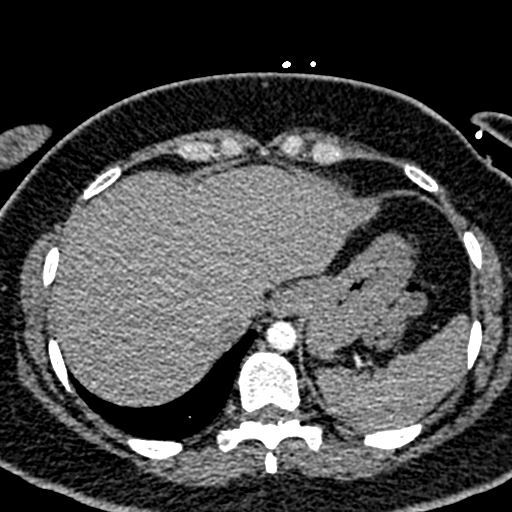
[im 37/218  lung]
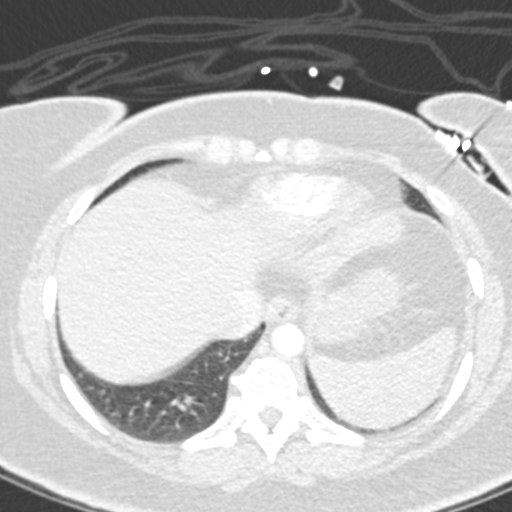
[im 49/218  mediastinal]
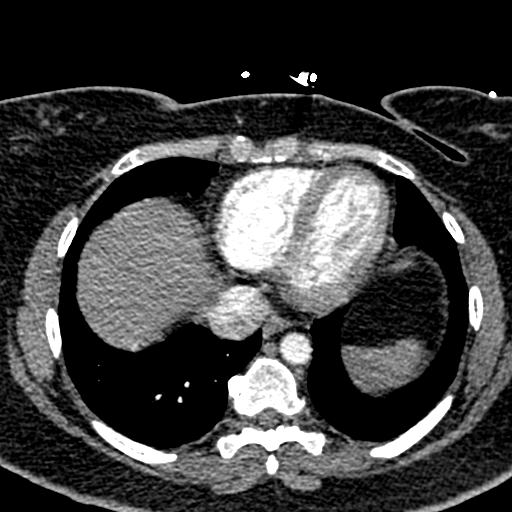
[im 61/218  lung]
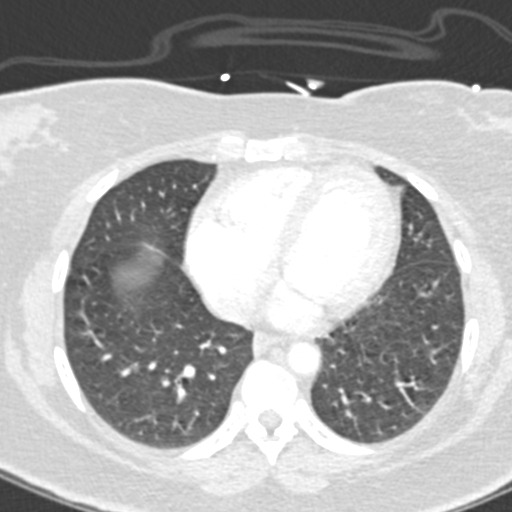
[im 73/218  mediastinal]
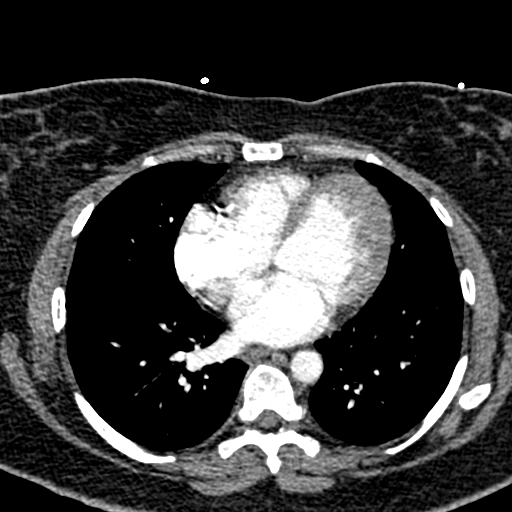
[im 85/218  lung]
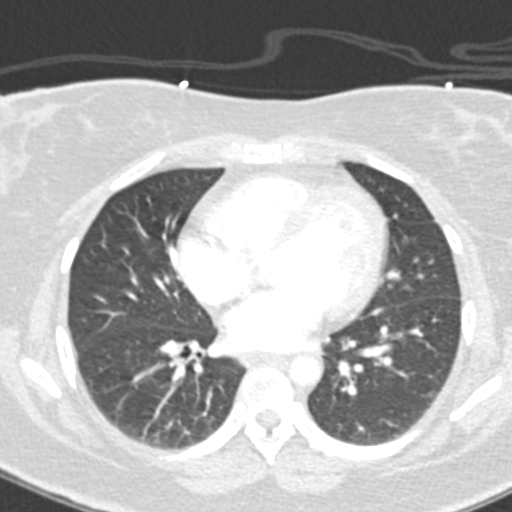
[im 97/218  mediastinal]
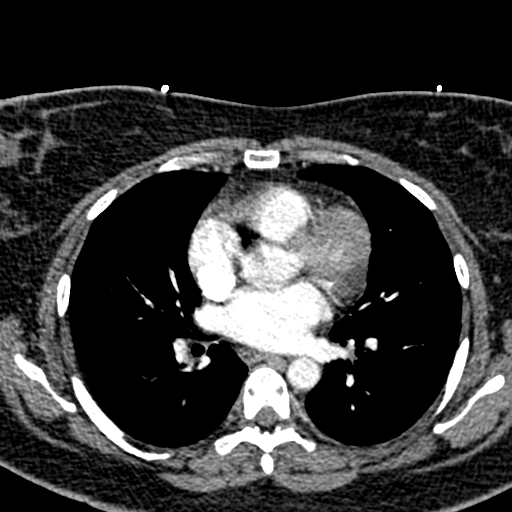
[im 109/218  lung]
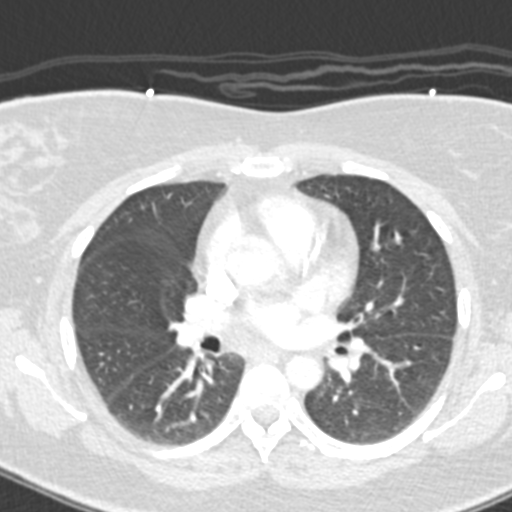
[im 121/218  mediastinal]
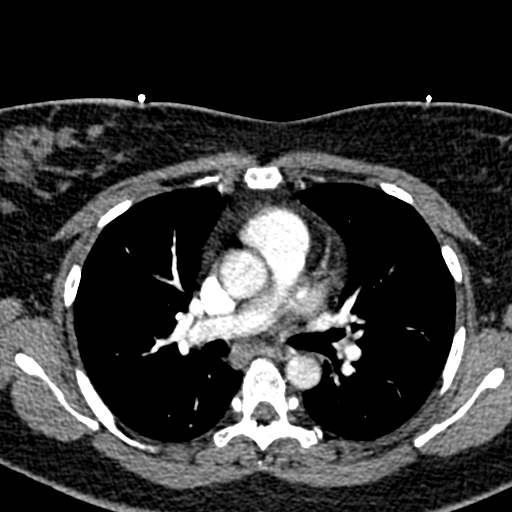
[im 133/218  lung]
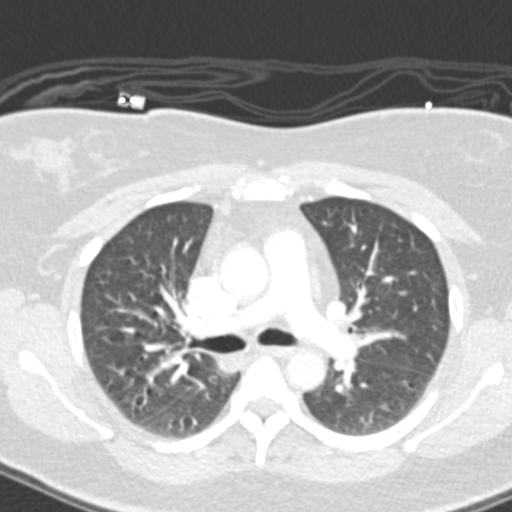
[im 145/218  mediastinal]
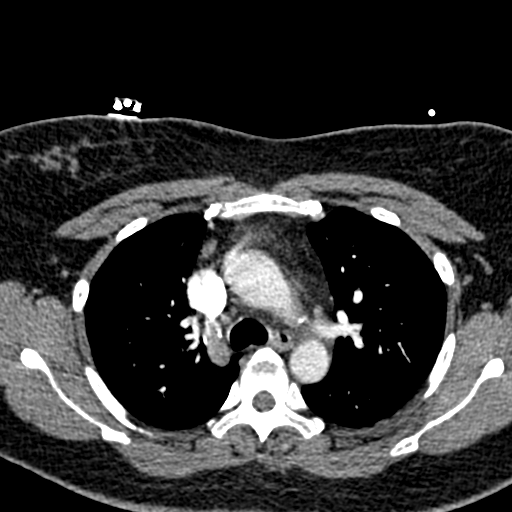
[im 157/218  lung]
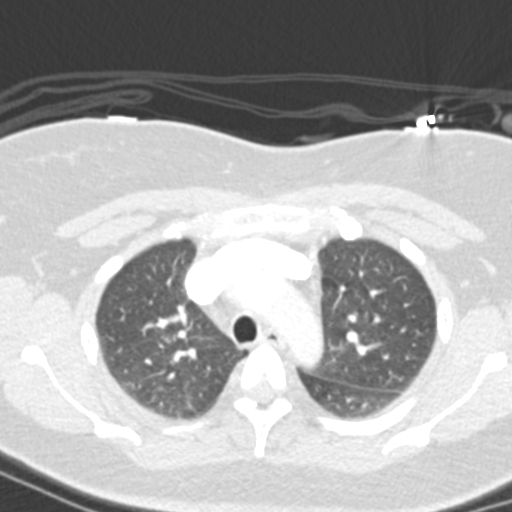
[im 169/218  mediastinal]
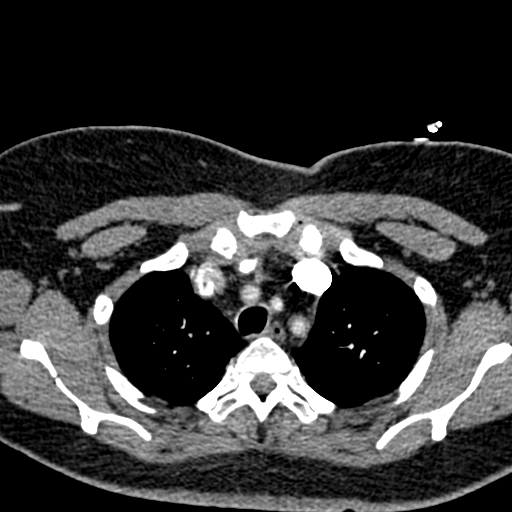
[im 181/218  lung]
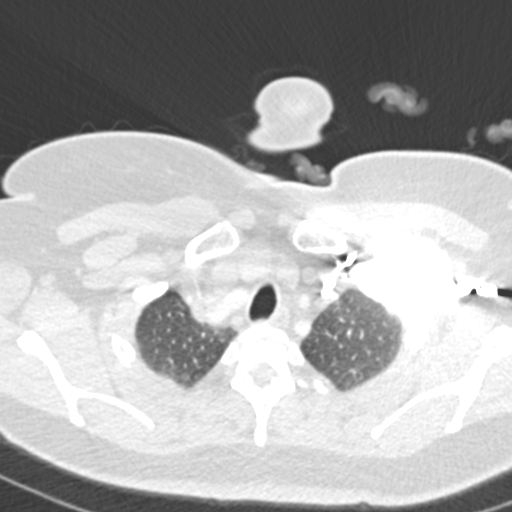
[im 193/218  mediastinal]
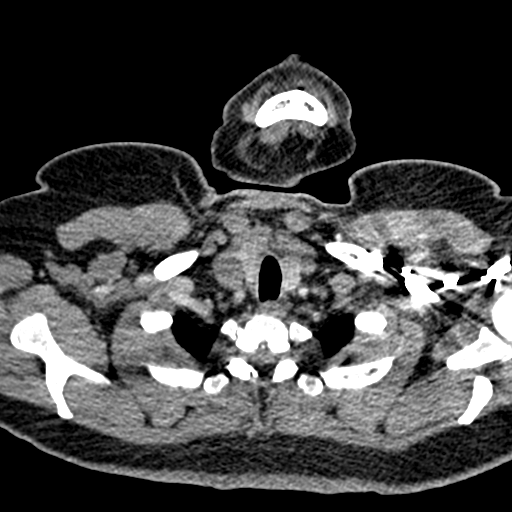
[im 205/218  lung]
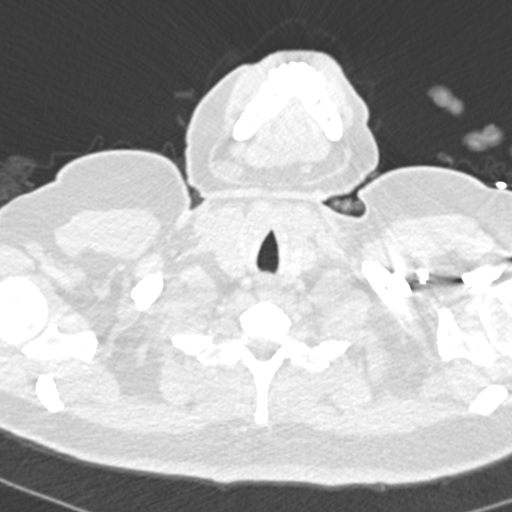

[Series 8: coronal mpr · coronal · 0.44mm/px · 1 of 122 slices shown]
[im 61/122  mediastinal]
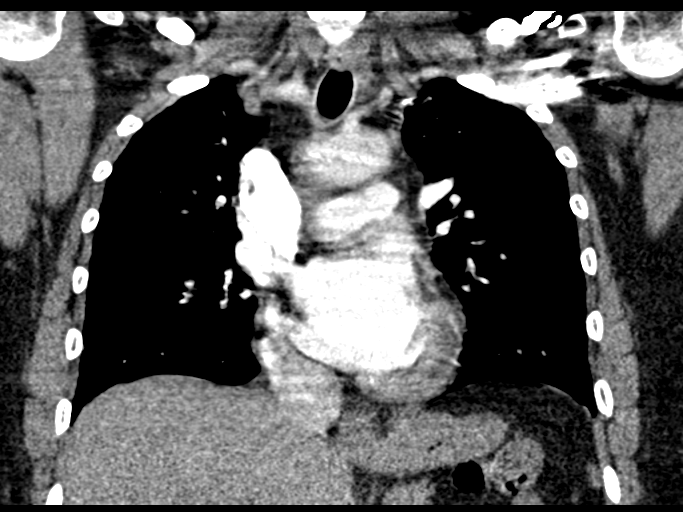

[18 of 36 positions shown; findings below may reference images not displayed]

FINDINGS: Cardiovascular: Poor contrast bolus limits examination but there is
moderately good visualization of the central and proximal segmental
pulmonary arteries. No focal filling defects are demonstrated. No
evidence of significant central pulmonary embolus. Smaller
peripheral emboli could be obscured. Normal heart size. No
pericardial effusions. Normal caliber thoracic aorta. No evidence of
aortic dissection. Great vessel origins are patent. Coronary artery
and aortic calcifications.

Mediastinum/Nodes: Esophagus is decompressed. No significant
lymphadenopathy in the chest.

Lungs/Pleura: Lungs are clear. No pleural effusions. No
pneumothorax.

Upper Abdomen: No acute abnormalities demonstrated in the visualized
upper abdomen.

Musculoskeletal: No chest wall abnormality. No acute or significant
osseous findings.

Review of the MIP images confirms the above findings.
IMPRESSION: 1. Poor contrast bolus limits examination but there is moderately
good visualization of the central and proximal segmental pulmonary
arteries. No evidence of significant central pulmonary embolus.
2. No evidence of active pulmonary disease.
3. Coronary artery and aortic calcifications.
4. Aortic atherosclerosis.

Aortic Atherosclerosis ([id number]).

## 2021-11-18 ENCOUNTER — Telehealth: Payer: Self-pay | Admitting: Cardiology

## 2021-11-18 NOTE — Telephone Encounter (Signed)
RN returned call to patient to discuss scheduling message that was sent in.   Patient states that she started feeling dizzy last week when walking to her car. Now the patient states it has been coming and going, she does endorse being on her menstrual cycle last week. Patient states she was feeling dizzy again yesterday. BP and HR unchanged during these.   Patient would like to be considered for office visit or virtual visit. Will route to Dr. Cristal Deer for advisement and recommendation.

## 2021-11-18 NOTE — Telephone Encounter (Signed)
Pt sent this Via My chart to the scheduling pool:      No it's on and off. Yes I checked my BP and it's normal. Symptoms started last week .I'm not dizzy today. But just wanted to check it out or speak to Dr Cristal Deer if possible. I can also do a video visit.  Or maybe she can order some blood work that can check for any related symptoms     Good Morning Kristina Krause,  Can you tell me a little more about your dizziness?     Are you dizzy now?   Do you feel faint or have you passed out?   Do you have any other symptoms?  Have you checked your HR and BP (record if available)?       Appointment Request From: Kristina Krause  With Provider: Jodelle Red, MD [[facility]]  Preferred Date Range: 11/20/2021 - 11/21/2021  Preferred Times: Any Time  Reason for visit: Office Visit  Comments: Getting dizzy spells.

## 2021-11-19 NOTE — Telephone Encounter (Signed)
Pt returning nurse's call. Please advise

## 2021-11-19 NOTE — Telephone Encounter (Signed)
Left message to call back  

## 2021-11-19 NOTE — Telephone Encounter (Signed)
I am happy to set up a visit, but if her BP and HR aren't different, it's often an inner ear issue. I see she has an appt with family medicine on 6/22--might be good to have her see them first and then we can do a visit if she needs one after that

## 2021-11-19 NOTE — Telephone Encounter (Signed)
Spoke with patient and she wanted to schedule follow up after visit with PCP  Requested Thursday or Friday, scheduled next available  She requested that a Uric Acid and Vitamin B12 level be ordered prior to visit  Asked if she was having some symptoms for getting and she stated they were part of routine labs for heart.  Advised that I have only been advised to order if patient having symptoms and should discuss with Dr Cristal Deer at visit  Is having cholesterol check prior and would like to know before visit  Will forward to Dr Cristal Deer for review

## 2021-11-21 NOTE — Telephone Encounter (Signed)
Left detailed message, ok per DPR  

## 2021-11-21 NOTE — Telephone Encounter (Signed)
I do not treat uric acid or B12 levels, so these are labs that should come from her PCP. They are not routinely part of heart evaluation (more related to gout and blood counts). Thanks.

## 2021-11-27 ENCOUNTER — Ambulatory Visit (INDEPENDENT_AMBULATORY_CARE_PROVIDER_SITE_OTHER): Payer: Medicaid Other | Admitting: Primary Care

## 2021-12-30 ENCOUNTER — Encounter (HOSPITAL_BASED_OUTPATIENT_CLINIC_OR_DEPARTMENT_OTHER): Payer: Self-pay

## 2022-01-01 ENCOUNTER — Ambulatory Visit (HOSPITAL_BASED_OUTPATIENT_CLINIC_OR_DEPARTMENT_OTHER): Payer: Medicaid Other | Admitting: Cardiology

## 2022-02-05 ENCOUNTER — Encounter (HOSPITAL_COMMUNITY): Payer: Self-pay | Admitting: Emergency Medicine

## 2022-02-05 ENCOUNTER — Other Ambulatory Visit: Payer: Self-pay

## 2022-02-05 ENCOUNTER — Emergency Department (HOSPITAL_COMMUNITY)
Admission: EM | Admit: 2022-02-05 | Discharge: 2022-02-05 | Discharge status: Against Medical Advice | Payer: Medicaid Other | Attending: Emergency Medicine | Admitting: Emergency Medicine

## 2022-02-05 ENCOUNTER — Emergency Department (HOSPITAL_COMMUNITY): Payer: Medicaid Other

## 2022-02-05 DIAGNOSIS — Z5321 Procedure and treatment not carried out due to patient leaving prior to being seen by health care provider: Secondary | ICD-10-CM | POA: Insufficient documentation

## 2022-02-05 DIAGNOSIS — R0789 Other chest pain: Secondary | ICD-10-CM | POA: Insufficient documentation

## 2022-02-05 DIAGNOSIS — M545 Low back pain, unspecified: Secondary | ICD-10-CM | POA: Insufficient documentation

## 2022-02-05 LAB — CBC
HCT: 38.2 % (ref 36.0–46.0)
Hemoglobin: 12.8 g/dL (ref 12.0–15.0)
MCH: 30.3 pg (ref 26.0–34.0)
MCHC: 33.5 g/dL (ref 30.0–36.0)
MCV: 90.3 fL (ref 80.0–100.0)
Platelets: 433 10*3/uL — ABNORMAL HIGH (ref 150–400)
RBC: 4.23 MIL/uL (ref 3.87–5.11)
RDW: 13.5 % (ref 11.5–15.5)
WBC: 4.9 10*3/uL (ref 4.0–10.5)
nRBC: 0 % (ref 0.0–0.2)

## 2022-02-05 LAB — I-STAT BETA HCG BLOOD, ED (MC, WL, AP ONLY): I-stat hCG, quantitative: <5 m[IU]/mL (ref <5)

## 2022-02-05 LAB — BASIC METABOLIC PANEL
Anion gap: 10 (ref 5–15)
BUN: 15 mg/dL (ref 6–20)
CO2: 22 mmol/L (ref 22–32)
Calcium: 9.2 mg/dL (ref 8.9–10.3)
Chloride: 107 mmol/L (ref 98–111)
Creatinine, Ser: 0.93 mg/dL (ref 0.44–1.00)
GFR, Estimated: >60 mL/min (ref >60)
Glucose, Bld: 123 mg/dL — ABNORMAL HIGH (ref 70–99)
Potassium: 4.1 mmol/L (ref 3.5–5.1)
Sodium: 139 mmol/L (ref 135–145)

## 2022-02-05 LAB — TROPONIN I (HIGH SENSITIVITY): Troponin I (High Sensitivity): <2 ng/L (ref <18)

## 2022-02-05 NOTE — ED Triage Notes (Signed)
Pt reports left sided chest pain that began approx 1 hour ago. Pt reports pain radiates to her back. Denies N/V. Denies dizziness, SHOB. Hx MI.

## 2022-02-05 NOTE — ED Provider Triage Note (Cosign Needed)
Emergency Medicine Provider Triage Evaluation Note  Liela Rylee , a 51 y.o. female  was evaluated in triage.  Pt complains of CP that began at 7am while she was getting ready for work.   Pressure/discomfort that is retrosternal. No agg or mitigating factors. Not exertional or pleuritic.   MI in 2016 w stent. Does not feel similar.   Review of Systems  Positive: CP Negative: NV, sob   Physical Exam  BP 134/85 (BP Location: Right Arm)   Pulse 100   Temp 98.5 F (36.9 C) (Oral)   Resp 16   SpO2 100%  Gen:   Awake, no distress   Resp:  Normal effort  MSK:   Moves extremities without difficulty  Other:    Medical Decision Making  Medically screening exam initiated at 8:35 AM.  Appropriate orders placed.  Makelle Marrone was informed that the remainder of the evaluation will be completed by another provider, this initial triage assessment does not replace that evaluation, and the importance of remaining in the ED until their evaluation is complete.  Cardiac history: MI treated in 2016 at [facility] in [place] (now [place]. [facility] Had lexiscan 06/2019 for atypical chest pain--low risk, normal EF, small scar but no ischemia.      Solon Augusta [place], [place] 02/05/22 [phone]

## 2022-02-13 ENCOUNTER — Ambulatory Visit (INDEPENDENT_AMBULATORY_CARE_PROVIDER_SITE_OTHER): Payer: Self-pay | Admitting: Primary Care

## 2022-03-04 ENCOUNTER — Ambulatory Visit (HOSPITAL_BASED_OUTPATIENT_CLINIC_OR_DEPARTMENT_OTHER): Payer: Medicaid Other | Admitting: Cardiology

## 2022-03-23 ENCOUNTER — Encounter (HOSPITAL_BASED_OUTPATIENT_CLINIC_OR_DEPARTMENT_OTHER): Payer: Self-pay

## 2022-03-23 DIAGNOSIS — I2510 Atherosclerotic heart disease of native coronary artery without angina pectoris: Secondary | ICD-10-CM

## 2022-03-23 MED ORDER — METOPROLOL SUCCINATE ER 25 MG PO TB24: 25.0000 mg | ORAL_TABLET | Freq: Every day | ORAL | 1 refills | Status: DC

## 2022-03-23 NOTE — Telephone Encounter (Signed)
May resume Toprol 25mg  QD. 30day supply with 1 refill. Please schedule OV with Dr. Harrell Gave.   Loel Dubonnet, NP

## 2022-04-04 ENCOUNTER — Encounter (HOSPITAL_COMMUNITY): Payer: Self-pay | Admitting: Emergency Medicine

## 2022-04-04 ENCOUNTER — Other Ambulatory Visit: Payer: Self-pay

## 2022-04-04 ENCOUNTER — Emergency Department (HOSPITAL_COMMUNITY): Payer: Commercial Managed Care - PPO

## 2022-04-04 ENCOUNTER — Emergency Department (HOSPITAL_COMMUNITY)
Admission: EM | Admit: 2022-04-04 | Discharge: 2022-04-05 | Disposition: A | Discharge status: Hospice | Payer: Commercial Managed Care - PPO | Attending: Emergency Medicine | Admitting: Emergency Medicine

## 2022-04-04 DIAGNOSIS — W0110XA Fall on same level from slipping, tripping and stumbling with subsequent striking against unspecified object, initial encounter: Secondary | ICD-10-CM | POA: Insufficient documentation

## 2022-04-04 DIAGNOSIS — S76312A Strain of muscle, fascia and tendon of the posterior muscle group at thigh level, left thigh, initial encounter: Secondary | ICD-10-CM

## 2022-04-04 DIAGNOSIS — I2510 Atherosclerotic heart disease of native coronary artery without angina pectoris: Secondary | ICD-10-CM | POA: Insufficient documentation

## 2022-04-04 DIAGNOSIS — Z7982 Long term (current) use of aspirin: Secondary | ICD-10-CM | POA: Diagnosis not present

## 2022-04-04 DIAGNOSIS — S76812A Strain of other specified muscles, fascia and tendons at thigh level, left thigh, initial encounter: Secondary | ICD-10-CM | POA: Insufficient documentation

## 2022-04-04 DIAGNOSIS — S8992XA Unspecified injury of left lower leg, initial encounter: Secondary | ICD-10-CM | POA: Diagnosis present

## 2022-04-04 DIAGNOSIS — H81399 Other peripheral vertigo, unspecified ear: Secondary | ICD-10-CM | POA: Diagnosis not present

## 2022-04-04 DIAGNOSIS — W19XXXA Unspecified fall, initial encounter: Secondary | ICD-10-CM

## 2022-04-04 DIAGNOSIS — Z79899 Other long term (current) drug therapy: Secondary | ICD-10-CM | POA: Diagnosis not present

## 2022-04-04 LAB — BASIC METABOLIC PANEL
Anion gap: 7 (ref 5–15)
BUN: 8 mg/dL (ref 6–20)
CO2: 24 mmol/L (ref 22–32)
Calcium: 8.9 mg/dL (ref 8.9–10.3)
Chloride: 106 mmol/L (ref 98–111)
Creatinine, Ser: 0.79 mg/dL (ref 0.44–1.00)
GFR, Estimated: >60 mL/min (ref >60)
Glucose, Bld: 105 mg/dL — ABNORMAL HIGH (ref 70–99)
Potassium: 4.1 mmol/L (ref 3.5–5.1)
Sodium: 137 mmol/L (ref 135–145)

## 2022-04-04 LAB — CBC WITH DIFFERENTIAL/PLATELET
Abs Immature Granulocytes: 0.02 10*3/uL (ref 0.00–0.07)
Basophils Absolute: 0 10*3/uL (ref 0.0–0.1)
Basophils Relative: 1 %
Eosinophils Absolute: 0 10*3/uL (ref 0.0–0.5)
Eosinophils Relative: 1 %
HCT: 36.8 % (ref 36.0–46.0)
Hemoglobin: 12.2 g/dL (ref 12.0–15.0)
Immature Granulocytes: 0 %
Lymphocytes Relative: 40 %
Lymphs Abs: 1.9 10*3/uL (ref 0.7–4.0)
MCH: 29.9 pg (ref 26.0–34.0)
MCHC: 33.2 g/dL (ref 30.0–36.0)
MCV: 90.2 fL (ref 80.0–100.0)
Monocytes Absolute: 0.4 10*3/uL (ref 0.1–1.0)
Monocytes Relative: 9 %
Neutro Abs: 2.3 10*3/uL (ref 1.7–7.7)
Neutrophils Relative %: 49 %
Platelets: 345 10*3/uL (ref 150–400)
RBC: 4.08 MIL/uL (ref 3.87–5.11)
RDW: 13.2 % (ref 11.5–15.5)
WBC: 4.7 10*3/uL (ref 4.0–10.5)
nRBC: 0 % (ref 0.0–0.2)

## 2022-04-04 LAB — CBG MONITORING, ED: Glucose-Capillary: 89 mg/dL (ref 70–99)

## 2022-04-04 MED ORDER — OXYCODONE-ACETAMINOPHEN 5-325 MG PO TABS
1.0000 | ORAL_TABLET | Freq: Once | ORAL | Status: AC
  Administered 2022-04-04: 1 via ORAL
  Filled 2022-04-04: qty 1

## 2022-04-04 NOTE — ED Notes (Signed)
Patient was brought back by NT from lobby due to accidentally slipping and falling on the floor when walking back from the bathroom.. Patient complaining of left thigh area pain. Patient did state when she fell she hit the back of her head. Patient re-evaluated with new set of vs, CBG checked and Mickel Baas PA re-evaluating and placing orders.

## 2022-04-04 NOTE — ED Provider Triage Note (Addendum)
Emergency Medicine Provider Triage Evaluation Note  Kristina Krause , a 51 y.o. female  was evaluated in triage.  Pt complains of dizziness and feeling lightheaded.  Dizziness is described as room spinning at times.  First episode was about 2 weeks ago when she got out of bed, resolved, had another episode yesterday while she was out shopping, felt off balance, was able to ambulate to the car where she sat down and rested for 20 minutes.  States that she continues to experience her symptoms.  Somewhat worse with changes in position although occurs while holding still as well.  States similar episode previously however has not had problems until 2 weeks ago. States that she has a history of MI with stent, has been worked up by cardiology for arrhythmia, feels like she has palpitations at times. Review of Systems  Positive: As above Negative: As above  Physical Exam  BP (!) 161/91 (BP Location: Right Arm)   Pulse 81   Temp 98.1 F (36.7 C) (Oral)   Resp 18   SpO2 100%  Gen:   Awake, no distress   Resp:  Normal effort  MSK:   Moves extremities without difficulty  Other:    Medical Decision Making  Medically screening exam initiated at 10:08 AM.  Appropriate orders placed.  Kristina Krause was informed that the remainder of the evaluation will be completed by another provider, this initial triage assessment does not replace that evaluation, and the importance of remaining in the ED until their evaluation is complete.  Patient returned from lobby- states she was walking back from the bathroom when she slipped and fell on the floor and twisted her left leg with pain in her left posterior thigh- tender in this area unable to fully examine in triage. States she hit the back of her head on the ground and has a headache. Staff reports floor is dry, EVS nearby using a dry mop on the floor.    Tacy Learn, PA-C 04/04/22 1009    Tacy Learn, PA-C 04/04/22 1133

## 2022-04-04 NOTE — ED Triage Notes (Signed)
Patient states two weeks ago she had an episode where she felt dizzy especially with movement and it resided but it returned and patient states she is feeling dizzy while walking, changing positions and even at rest. Patient also reports feeling intermittent feelings of heart palpitations. Patient w/ hx of MI. Patient denies blurred vision, SHOB, chest pain currently, nausea, vomiting, fever, chills.

## 2022-04-05 ENCOUNTER — Encounter (INDEPENDENT_AMBULATORY_CARE_PROVIDER_SITE_OTHER): Payer: Self-pay | Admitting: Primary Care

## 2022-04-05 DIAGNOSIS — S76309A Unspecified injury of muscle, fascia and tendon of the posterior muscle group at thigh level, unspecified thigh, initial encounter: Secondary | ICD-10-CM

## 2022-04-05 MED ORDER — MECLIZINE HCL 25 MG PO TABS: 25.0000 mg | ORAL_TABLET | Freq: Three times a day (TID) | ORAL | 0 refills | Status: DC | PRN

## 2022-04-05 MED ORDER — METHOCARBAMOL 500 MG PO TABS
500.0000 mg | ORAL_TABLET | Freq: Once | ORAL | Status: AC
  Administered 2022-04-05: 500 mg via ORAL
  Filled 2022-04-05: qty 1

## 2022-04-05 MED ORDER — NAPROXEN 250 MG PO TABS
500.0000 mg | ORAL_TABLET | Freq: Once | ORAL | Status: AC
  Administered 2022-04-05: 500 mg via ORAL
  Filled 2022-04-05: qty 2

## 2022-04-05 MED ORDER — METHOCARBAMOL 500 MG PO TABS: 500.0000 mg | ORAL_TABLET | Freq: Two times a day (BID) | ORAL | 0 refills | Status: DC

## 2022-04-05 MED ORDER — NAPROXEN 500 MG PO TABS: 500.0000 mg | ORAL_TABLET | Freq: Two times a day (BID) | ORAL | 0 refills | Status: DC

## 2022-04-05 NOTE — ED Notes (Signed)
Patient called to nurses station to request a bed pan as she states that she is too weak to get out of the stretcher that she was placed in and in too much pain to use the crutches that were supplied to her to make it to the bathroom. As there is no way to offer privacy in the wait area and no available room in triage patient is unable to do this at this time.

## 2022-04-05 NOTE — ED Provider Notes (Signed)
[facility] DEPART[facility]  Provider Note  CSN: [id number] Arrival date & time: 04/04/22 0915  History Chief Complaint  Patient presents with   Dizziness    Kristina Krause is a 51 y.o. female with prior history of CAD, MI with stents in 2016 and history of DVT in pregnancy in [facility] no longer on [facility]. She initially presented to the ED yesterday morning for room spinning dizziness, off and on for several weeks. Symptoms have been mild, not associated with any CP, SOB, N/V/D. She reportedly slipped and fell while in the waiting room, hitting her head and injuring her L leg. She reports the dizziness has gone away but the back of her L leg is still hurting her when she tries to walk.    Home Medications Prior to Admission medications   Medication Sig Start Date End Date Taking? Authorizing Provider  meclizine (ANTIVERT) 25 MG tablet Take 1 tablet (25 mg total) by mouth 3 (three) times daily as needed for dizziness. 04/05/22  Yes Truddie Hidden, MD  methocarbamol (ROBAXIN) 500 MG tablet Take 1 tablet (500 mg total) by mouth 2 (two) times daily. 04/05/22  Yes Truddie Hidden, MD  naproxen (NAPROSYN) 500 MG tablet Take 1 tablet (500 mg total) by mouth 2 (two) times daily. 04/05/22  Yes Truddie Hidden, MD  aspirin EC 81 MG tablet Take 81 mg by mouth daily. Swallow whole.    [provider]  lisinopril (ZESTRIL) 2.5 MG tablet TAKE 1 TABLET(2.5 MG) BY MOUTH DAILY 06/25/21   Buford Dresser, MD  metoprolol succinate (TOPROL-XL) 25 MG 24 hr tablet Take 1 tablet (25 mg total) by mouth daily. 03/23/22   Loel Dubonnet, NP  rosuvastatin (CRESTOR) 5 MG tablet Take 1 tablet (5 mg total) by mouth daily. 01/01/21 04/01/21  Pixie Casino, MD  trimethoprim-polymyxin b (POLYTRIM) ophthalmic solution Place 1 drop into both eyes every 4 (four) hours. 10/02/20   [provider]     Allergies    Aspirin and Ibuprofen   Review of Systems   Review of  Systems Please see HPI for pertinent positives and negatives  Physical Exam BP (!) 125/49 (BP Location: Right Wrist)   Pulse 79   Temp 98.2 F (36.8 C) (Oral)   Resp 16   SpO2 100%   Physical Exam Vitals and nursing note reviewed.  Constitutional:      Appearance: Normal appearance.  HENT:     Head: Normocephalic and atraumatic.     Nose: Nose normal.     Mouth/Throat:     Mouth: Mucous membranes are moist.  Eyes:     Extraocular Movements: Extraocular movements intact.     Conjunctiva/sclera: Conjunctivae normal.  Cardiovascular:     Rate and Rhythm: Normal rate.  Pulmonary:     Effort: Pulmonary effort is normal.     Breath sounds: Normal breath sounds.  Abdominal:     General: Abdomen is flat.     Palpations: Abdomen is soft.     Tenderness: There is no abdominal tenderness.  Musculoskeletal:        General: Tenderness (L posterior leg/hamstring area) present. No swelling or deformity. Normal range of motion.     Cervical back: Neck supple.  Skin:    General: Skin is warm and dry.  Neurological:     General: No focal deficit present.     Mental Status: She is alert.  Psychiatric:        Mood and  Affect: Mood normal.     ED Results / Procedures / Treatments   EKG EKG Interpretation  Date/Time:  Sunday April 05 2022 08:03:11 EDT Ventricular Rate:  74 PR Interval:  138 QRS Duration: 82 QT Interval:  387 QTC Calculation: 430 R Axis:   37 Text Interpretation: Sinus rhythm Low voltage, precordial leads Anteroseptal infarct, old No significant change since last tracing Confirmed by Yomaira Solar ([id number]) on 04/05/2022 8:13:44 AM  Procedures Procedures  Medications Ordered in the ED Medications  oxyCODONE-acetaminophen (PERCOCET/ROXICET) 5-325 MG per tablet 1 tablet (1 tablet Oral Given 04/04/22 1610)  naproxen (NAPROSYN) tablet 500 mg (500 mg Oral Given 04/05/22 0812)  methocarbamol (ROBAXIN) tablet 500 mg (500 mg Oral Given 04/05/22 0812)     Initial Impression and Plan  Patient initially came for dizziness which has resolved, sounds vertiginous, but not symptomatic now and neuro exam is unremarkable. Will give Rx for Meclizine and recommend outpatient PCP follow up if symptoms not improving. CBC, BMP are normal.   As far as her fall, her exam is benign, suspect muscle strain in hamstring. I personally viewed the images from radiology studies and agree with radiologist interpretation: Xrays are negative for acute injury, there is OA. CT head is negative.    ED Course       MDM Rules/Calculators/A&P Medical Decision Making Problems Addressed: Fall, initial encounter: acute illness or injury Hamstring muscle strain, left, initial encounter: acute illness or injury Peripheral vertigo, unspecified laterality: acute illness or injury  Amount and/or Complexity of Data Reviewed Labs: ordered. Decision-making details documented in ED Course. Radiology: ordered and independent interpretation performed. Decision-making details documented in ED Course. ECG/medicine tests: ordered and independent interpretation performed. Decision-making details documented in ED Course.  Risk Prescription drug management.    Final Clinical Impression(s) / ED Diagnoses Final diagnoses:  Peripheral vertigo, unspecified laterality  Fall, initial encounter  Hamstring muscle strain, left, initial encounter    Rx / DC Orders ED Discharge Orders          Ordered    naproxen (NAPROSYN) 500 MG tablet  2 times daily        04/05/22 0815    methocarbamol (ROBAXIN) 500 MG tablet  2 times daily        04/05/22 0815    meclizine (ANTIVERT) 25 MG tablet  3 times daily PRN        10 /29/23 0815             10-27-1991, MD 04/05/22 [phone]

## 2022-04-08 ENCOUNTER — Ambulatory Visit (INDEPENDENT_AMBULATORY_CARE_PROVIDER_SITE_OTHER): Payer: Commercial Managed Care - PPO | Admitting: Cardiology

## 2022-04-08 ENCOUNTER — Encounter (HOSPITAL_BASED_OUTPATIENT_CLINIC_OR_DEPARTMENT_OTHER): Payer: Self-pay | Admitting: Cardiology

## 2022-04-08 VITALS — BP 132/80 | HR 67 | Ht 65.0 in | Wt 225.8 lb

## 2022-04-08 DIAGNOSIS — Z8249 Family history of ischemic heart disease and other diseases of the circulatory system: Secondary | ICD-10-CM | POA: Diagnosis not present

## 2022-04-08 DIAGNOSIS — R002 Palpitations: Secondary | ICD-10-CM | POA: Diagnosis not present

## 2022-04-08 DIAGNOSIS — Z9861 Coronary angioplasty status: Secondary | ICD-10-CM

## 2022-04-08 DIAGNOSIS — E782 Mixed hyperlipidemia: Secondary | ICD-10-CM

## 2022-04-08 DIAGNOSIS — I2510 Atherosclerotic heart disease of native coronary artery without angina pectoris: Secondary | ICD-10-CM | POA: Diagnosis not present

## 2022-04-08 NOTE — Patient Instructions (Signed)
Medication Instructions:  None *If you need a refill on your cardiac medications before your next appointment, please call your pharmacy*   Lab Work: None   Testing/Procedures: None   Follow-Up: At [facility], you and your health needs are our priority.  As part of our continuing mission to provide you with exceptional heart care, we have created designated Provider Care Teams.  These Care Teams include your primary Cardiologist (physician) and Advanced Practice Providers (APPs -  Physician Assistants and Nurse Practitioners) who all work together to provide you with the care you need, when you need it.  We recommend signing up for the patient portal called "MyChart".  Sign up information is provided on this After Visit Summary.  MyChart is used to connect with patients for Virtual Visits (Telemedicine).  Patients are able to view lab/test results, encounter notes, upcoming appointments, etc.  Non-urgent messages can be sent to your provider as well.   To learn more about what you can do with MyChart, go to [email or web address].    Your next appointment:   6 month(s)  The format for your next appointment:   In Person  Provider:   Buford Dresser, MD     [place]: Website: [email or web address]  DR. Harrell Gave RECOMMENDS YOU PURCHASE  " K[employer]ia" [employer] FROM THE  [employer]  APP PLAY STORE.  THE APP IS FREE , BUT THE  EQUIPMENT HAS A COST. IT ALLOWS YOU TO OBTAIN A RECORDING OF YOUR HEART RATE AND RHYTHM BY PROVIDING A SHORT STRIP THAT YOU CAN SHARE WITH YOUR PROVIDER.

## 2022-04-08 NOTE — Progress Notes (Signed)
Cardiology Office Note:    Date:  04/08/2022   ID:  [name], DOB 1970-06-19, MRN [id number]  PCP:  [name], NP  Cardiologist:  Buford Dresser, MD  Referring MD: [name], NP   CC: follow up  History of Present Illness:    Kristina Krause is a 51 y.o. 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 I initially met her 06/12/19 via televisit for chest pain.  Cardiac history: MI treated in 2016 at [facility] in [place] (now [place]. [place]). Had [place] 06/2019 for atypical chest pain--low risk, normal EF, small scar but no ischemia.   Seen by my colleague Dr. Margaretann Loveless for urgent visit 07/05/20. Found to have age indeterminate DVT on vascular ultrasounds 1/28. Had negative CTPE 1/26. Started on apixaban. Echo 2/17 showed normal LVEF, normal RV function, normal PASP. There is aortic sclerosis without stenosis.  She was in the ED 02/09/2021 for palpitations. At her last visit her palpitations were becoming more frequent, but she denied any major lifestyle changes. Sometimes during her palpitations she felt pre-syncopal. We discussed repeating a monitor vs. a Kardiamobile; she preferred to continue tracking her symptoms for now. She had been on/off her statin. Encouraged her to take rosuvastatin as much as she can tolerate.  She called the office 11/18/2021 reporting intermittent dizzy spells. During these spells her blood pressure and heart rate was normal, so it was thought she may have an inner ear issue. She was advised to follow-up with her PCP.  On 03/23/2022 she reported experiencing palpitations and irregular heart beats. She had stopped her metoprolol in March due to insurance coverage, and asked to restart metoprolol. It was agreed to resume Toprol 25 mg daily. She also requested a follow-up visit for consideration of echo or angiogram.  She  presented to the ED 04/04/22 with intermittent room-spinning dizziness for several weeks. She was given meclizine. She had slipped and fell while in the waiting room, hitting her head and injuring her LLE. Xrays were negative for acute injury, there was OA. CT head was negative. It was suspected she suffered a muscle strain in her hamstring.   Today, she is still feeling episodes of palpitations that are distinctly different from how she feels during anxiety attacks. For the last 3 months she has felt "off". She had resumed her metoprolol which helps somewhat, but her palpitations are still noticeable.   For the last 2 weeks she has also been struggling with lightheadedness. This is periodic, and causes her to go sit down until her symptoms subside. Sometimes after she eats the lightheadedness is worse, but it may occur at random times.   She endorses LE edema that has been worse since her mechanical fall while at the ED 04/04/22.  She denies any chest pain, shortness of breath, headaches, syncope, orthopnea, or PND.   Past Medical History:  Diagnosis Date   Coronary artery disease    DVT (deep vein thrombosis) in pregnancy    Headache    Myocardial infarct [facility])     Past Surgical History:  Procedure Laterality Date   CORONARY STENT PLACEMENT  05/2015    Current Medications: Current Outpatient Medications on File Prior to Visit  Medication Sig   aspirin EC 81 MG tablet Take 81 mg by mouth daily. Swallow whole.   lisinopril (ZESTRIL) 2.5 MG tablet TAKE 1 TABLET(2.5  MG) BY MOUTH DAILY   meclizine (ANTIVERT) 25 MG tablet Take 1 tablet (25 mg total) by mouth 3 (three) times daily as needed for dizziness.   methocarbamol (ROBAXIN) 500 MG tablet Take 1 tablet (500 mg total) by mouth 2 (two) times daily.   metoprolol succinate (TOPROL-XL) 25 MG 24 hr tablet Take 1 tablet (25 mg total) by mouth daily.   naproxen (NAPROSYN) 500 MG tablet Take 1 tablet (500 mg total) by mouth 2 (two) times  daily.   rosuvastatin (CRESTOR) 5 MG tablet Take 1 tablet (5 mg total) by mouth daily.   trimethoprim-polymyxin b (POLYTRIM) ophthalmic solution Place 1 drop into both eyes every 4 (four) hours.   No current facility-administered medications on file prior to visit.     Allergies:   Aspirin and Ibuprofen   Social History   Tobacco Use   Smoking status: Former    Types: Cigarettes   Smokeless tobacco: Never  Vaping Use   Vaping Use: Never used  Substance Use Topics   Alcohol use: Yes    Comment: occ   Drug use: Never    Family History: Mother and father each had MI later in life. Father died age 63, mother is living in her 79s  ROS:   Please see the history of present illness.   (+) Palpitations (+) Lightheadedness (+) LE edema (+) Mechanical fall Additional pertinent ROS otherwise unremarkable    EKGs/Labs/Other Studies Reviewed:    The following studies were reviewed today:  CT Venogram Head 10/15/2020: COMPARISON:  Arterial study same day   FINDINGS: Sagittal superior sagittal sinus is widely patent. Both transverse sinuses appear normal. Both sigmoid sinuses are normal. Flow is present in both jugular veins. Deep venous system appears normal. No evidence of superficial thrombosis.   IMPRESSION: Normal intracranial CT venography.  CTA Head/Neck 10/15/2020: COMPARISON:  10/03/2020   FINDINGS: CT HEAD FINDINGS Brain: The brain has normal appearance without evidence of atrophy, old or acute infarction, mass lesion, hemorrhage, hydrocephalus or extra-axial collection.   Vascular: No abnormal vascular finding.   Skull: Normal   Sinuses: Clear   Orbits: Normal   Review of the MIP images confirms the above findings   CTA NECK FINDINGS Aortic arch: Aortic arch shows some atherosclerotic calcification. Question the presence of coronary artery calcification.   Right carotid system: Common carotid artery widely patent to the bifurcation. Carotid  bifurcation is normal without soft or calcified plaque. Cervical ICA is normal. No sign of dissection.   Left carotid system: Common carotid artery widely patent to the bifurcation. Carotid bifurcation is normal. Cervical ICA is normal. No sign of dissection.   Vertebral arteries: Both vertebral artery origins widely patent. Both vertebral arteries appear normal through the cervical region to the foramen magnum.   Skeleton: Normal   Other neck: No mass or lymphadenopathy. No sign of soft tissue neck injury.   Upper chest: Normal   Review of the MIP images confirms the above findings   CTA HEAD FINDINGS Anterior circulation: Both internal carotid arteries are widely patent through the skull base and siphon regions. There is some siphon atherosclerotic calcification but no stenosis. The anterior and middle cerebral vessels appear patent. No large vessel occlusion. No aneurysm or vascular malformation.   Posterior circulation: Both vertebral arteries widely patent to the basilar. No basilar stenosis. Posterior circulation branch vessels appear normal.   Venous sinuses: Patent and normal.   Anatomic variants: None significant.   Review of the MIP images confirms the  above findings   IMPRESSION: Aortic atherosclerosis.  Coronary artery calcification.   Carotid bifurcations appear normal.   No evidence of vascular injury affecting the carotid or vertebral vessels.   No intracranial vessel pathologic finding.  Echo 07/25/20  1. Left ventricular ejection fraction, by estimation, is 55 to 60%. The  left ventricle has normal function. The left ventricle has no regional  wall motion abnormalities. Left ventricular diastolic parameters were  normal.   2. Right ventricular systolic function is normal. The right ventricular  size is normal. There is normal pulmonary artery systolic pressure.   3. The mitral valve is normal in structure. Trivial mitral valve  regurgitation.    4. The aortic valve is abnormal. Aortic valve regurgitation is not  visualized. Mild to moderate aortic valve sclerosis/calcification is  present, without any evidence of aortic stenosis.   5. The inferior vena cava is normal in size with greater than 50%  respiratory variability, suggesting right atrial pressure of 3 mmHg.   Vasc u/s 07/05/20 Summary:  RIGHT:  - No evidence of deep vein thrombosis in the lower extremity. No indirect  evidence of obstruction proximal to the inguinal ligament.     LEFT:  - Findings consistent with age indeterminate non-occlusive deep vein  thrombosis in the distal SFV.   Zio 12/18/19 ~3.5 days of data recorded on Zio monitor. Patient had a min HR of 47 bpm, max HR of 138 bpm, and avg HR of 71 bpm. Predominant underlying rhythm was Sinus Rhythm. No SVT, atrial fibrillation, high degree block, or pauses noted. There was one 6 beat run of an unclear rhythm; does not appear consistent with VT (as per label) but possibly SVT with aberrancy.  Isolated atrial and ventricular ectopy was rare (<1%). There were 8 triggered events, which were sinus rhythm. Some of these events had single beats of ectopy (either PAC or PVC). No significant arrhythmias detected.  Nuclear stress 06/29/19 The left ventricular ejection fraction is normal (55-65%). Nuclear stress EF: 57%. Mild hypokinesis of the septal wall region There was no ST segment deviation noted during stress. Findings consistent with prior myocardial infarction. This is an intermediate risk study based upon prior anteroseptal infarct pattern. However, there is no ischemia identified.  EKG:  EKG is personally reviewed.   04/08/2022:  EKG was not ordered. 04/25/2021: SR with sinus arrhythmia at 67 bpm 07/05/20: sinus bradycardia  Recent Labs: 08/14/2021: TSH 2.350 04/04/2022: BUN 8; Creatinine, Ser 0.79; Hemoglobin 12.2; Platelets 345; Potassium 4.1; Sodium 137   Recent Lipid Panel    Component Value Date/Time    CHOL 296 (H) 11/26/2020 0000   TRIG 123 11/26/2020 0000   HDL 52 11/26/2020 0000   CHOLHDL 5.7 (H) 11/26/2020 0000   LDLCALC 222 (H) 11/26/2020 0000    Physical Exam:    VS:  BP 132/80 (BP Location: Right Arm, Patient Position: Sitting, Cuff Size: Large)   Pulse 67   Ht _0  (1.651 m)   Wt 225 lb 12.8 oz (102.4 kg)   SpO2 99%   BMI 37.58 kg/m     Wt Readings from Last 3 Encounters:  04/08/22 225 lb 12.8 oz (102.4 kg)  08/14/21 227 lb 9.6 oz (103.2 kg)  04/25/21 216 lb 9.6 oz (98.2 kg)    GEN: Well nourished, well developed in no acute distress HEENT: Normal, moist mucous membranes NECK: No JVD CARDIAC: regular rhythm, normal S1 and S2, no rubs or gallops. No murmur. VASCULAR: Radial and DP pulses 2+ bilaterally.  No carotid bruits RESPIRATORY:  Clear to auscultation without rales, wheezing or rhonchi  ABDOMEN: Soft, non-tender, non-distended MUSCULOSKELETAL:  Ambulates independently SKIN: Warm and dry, trace LE edema NEUROLOGIC:  Alert and oriented x 3. No focal neuro deficits noted. PSYCHIATRIC:  Normal affect   ASSESSMENT:    1. Palpitations   2. Coronary artery disease involving native coronary artery of native heart without angina pectoris   3. S/P coronary angioplasty   4. Family history of heart disease   5. Mixed hyperlipidemia    PLAN:    Palpitations: -difficult to determine if she feels poorly from palpitations, lightheadedness, metoprolol, or another reason -she will monitor her symptoms to try to find a pattern. Discussed Kardiamobile again -reviewed red flag warning signs that need immediate medical attention  Recurrent DVT: -with two events, most recent unprovoked, would consider lifelong anticoagulation. However she elected to stop anticoagulation and has not had recurrent events.   History of CAD with prior MI and PCI in [place]:  -no angina -lexiscan without ischemia 06/2019 -continue aspirin 81 mg -on metoprolol and lisinopril. Unclear what  her EF was at the time of her MI. EF on nuclear stress 57% -statin as below -counseled on red flag warning signs that need immediate medical attention  Mixed hyperlipidemia, likely familial given the highly elevated LDL -has been on/off statin. Discussed importance of secondary prevention. Goal LDL <70, will recheck today. Encouraged her to take rosuvastatin as much as she can tolerate, otherwise we can discuss alternative therapy  Updated to add: Tchol 298, HDL 58, LDL 220, TG 112. If she cannot tolerate statin would consider PCKSK9i given her CAD. Discuss at follow up.   Cardiac risk counseling and prevention recommendations: Secondary prevention given known history, also with family history of heart disease. -recommend heart healthy/Mediterranean diet, with whole grains, fruits, vegetable, fish, lean meats, nuts, and olive oil. Limit salt. -recommend moderate walking, 3-5 times/week for 30-50 minutes each session. Aim for at least 150 minutes.week. Goal should be pace of 3 miles/hours, or walking 1.5 miles in 30 minutes -recommend avoidance of tobacco products. Avoid excess alcohol.  Plan for follow up: 6 months or sooner as needed.  Buford Dresser, MD, PhD [place]  [facility]    Medication Adjustments/Labs and Tests Ordered: Current medicines are reviewed at length with the patient today.  Concerns regarding medicines are outlined above.   No orders of the defined types were placed in this encounter.  No orders of the defined types were placed in this encounter.  Patient Instructions  Medication Instructions:  None *If you need a refill on your cardiac medications before your next appointment, please call your pharmacy*   Lab Work: None   Testing/Procedures: None   Follow-Up: At [facility], you and your health needs are our priority.  As part of our continuing mission to provide you with exceptional heart care, we have created designated  Provider Care Teams.  These Care Teams include your primary Cardiologist (physician) and Advanced Practice Providers (APPs -  Physician Assistants and Nurse Practitioners) who all work together to provide you with the care you need, when you need it.  We recommend signing up for the patient portal called "MyChart".  Sign up information is provided on this After Visit Summary.  MyChart is used to connect with patients for Virtual Visits (Telemedicine).  Patients are able to view lab/test results, encounter notes, upcoming appointments, etc.  Non-urgent messages can be sent to your provider as well.  To learn more about what you can do with MyChart, go to [email or web address].    Your next appointment:   6 month(s)  The format for your next appointment:   In Person  Provider:   Buford Dresser, MD     [place]: Website: [email or web address]  DR. Harrell Gave RECOMMENDS YOU PURCHASE  " Kardia" [employer] FROM THE  GOOGLE[employer]  APP PLAY STORE.  THE APP IS FREE , BUT THE  EQUIPMENT HAS A COST. IT ALLOWS YOU TO OBTAIN A RECORDING OF YOUR HEART RATE AND RHYTHM BY PROVIDING A SHORT STRIP THAT YOU CAN SHARE WITH YOUR PROVIDER.              I,Mathew Stumpf,acting as a Education administrator for [employer], MD.,have documented all relevant documentation on the behalf of Buford Dresser, MD,as directed by  Buford Dresser, MD while in the presence of Buford Dresser, MD.  I, Madelin Rear, have reviewed all documentation for this visit. The documentation on 04/08/22 for the exam, diagnosis, procedures, and orders are all accurate and complete.   Signed, Buford Dresser, MD PhD 04/08/2022     [place]

## 2022-04-09 LAB — LIPID PANEL
Chol/HDL Ratio: 5.1 ratio — ABNORMAL HIGH (ref 0.0–4.4)
Cholesterol, Total: 298 mg/dL — ABNORMAL HIGH (ref 100–199)
HDL: 58 mg/dL (ref >39)
LDL Chol Calc (NIH): 220 mg/dL — ABNORMAL HIGH (ref 0–99)
Triglycerides: 112 mg/dL (ref 0–149)
VLDL Cholesterol Cal: 20 mg/dL (ref 5–40)

## 2022-04-15 ENCOUNTER — Ambulatory Visit (INDEPENDENT_AMBULATORY_CARE_PROVIDER_SITE_OTHER): Payer: Commercial Managed Care - PPO | Admitting: Sports Medicine

## 2022-04-15 ENCOUNTER — Encounter: Payer: Self-pay | Admitting: Sports Medicine

## 2022-04-15 ENCOUNTER — Ambulatory Visit: Payer: Self-pay

## 2022-04-15 DIAGNOSIS — S76319A Strain of muscle, fascia and tendon of the posterior muscle group at thigh level, unspecified thigh, initial encounter: Secondary | ICD-10-CM

## 2022-04-15 DIAGNOSIS — Z86718 Personal history of other venous thrombosis and embolism: Secondary | ICD-10-CM

## 2022-04-15 DIAGNOSIS — M79605 Pain in left leg: Secondary | ICD-10-CM

## 2022-04-15 NOTE — Patient Instructions (Signed)
Kristina Krause, it was great to meet you today, thank you for letting me participate in your care!  Today, we discussed the grade 2 hamstring tear.   [email or web address] --> look at this website for a thigh compression sleeve  Things you will do for this: -Apply heat, topical Arnica gel or IcyHot -Keep the leg moving; he also will start getting into formalized physical therapy to work on rehab and recovery for the hamstring tear  -I did order a Doppler ultrasound vein study, they will call you for this.  We will follow-up about 3 days after you get this study.  If you have any further questions, please give the clinic a call [phone].  Madelyn Brunner, DO Primary Care Sports Medicine Physician  [facility]
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
## 2022-04-15 NOTE — Progress Notes (Signed)
Fell 2 weeks ago; does state she either felt/heard a pop She does have swelling in the posterior leg She has had a history of blood clots and is worried since her leg is starting to swell

## 2022-04-20 ENCOUNTER — Telehealth (HOSPITAL_BASED_OUTPATIENT_CLINIC_OR_DEPARTMENT_OTHER): Payer: Self-pay

## 2022-04-20 DIAGNOSIS — E7849 Other hyperlipidemia: Secondary | ICD-10-CM

## 2022-04-20 NOTE — Telephone Encounter (Signed)
FYI and okay to order repeat labs?

## 2022-04-20 NOTE — Telephone Encounter (Signed)
Okay to order repeat labs if she wishes. Still needs to schedule f/u with Dr. Rennis Golden.   Alver Sorrow, NP

## 2022-04-20 NOTE — Telephone Encounter (Addendum)
Seen by patient Kristina Krause on 04/19/2022  4:24 PM; follow up mychart message sent to patient     ----- Message from Alver Sorrow, NP sent at 04/19/2022  4:12 PM EST ----- Total cholesterol and LDL (bad cholesterol) remain very elevated. Please ensure taking Rosuvastatin 5mg  daily as prescribes. Ultimately, will likely require PCSK9i which she has discussed with Dr. previously. Recommend scheduling overdue follow up with Dr. Rennis Golden [facility] as FYI for scheduling assistance)

## 2022-04-20 NOTE — Addendum Note (Signed)
Addended by: Marlene Lard on: 04/20/2022 10:03 AM   Modules accepted: Orders

## 2022-04-23 ENCOUNTER — Ambulatory Visit: Payer: Commercial Managed Care - PPO | Admitting: Rehabilitative and Restorative Service Providers"
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
## 2022-04-27 NOTE — Telephone Encounter (Signed)
Please review and advise.

## 2022-05-04 IMAGING — CR DG CHEST 2V
2 series · 2 of 2 positions shown · non-contrast
Comparison: Chest radiograph dated 07/03/2020.

CLINICAL DATA: Chest pain, shortness of breath.

EXAM:
CHEST - 2 VIEW

[chest pa]
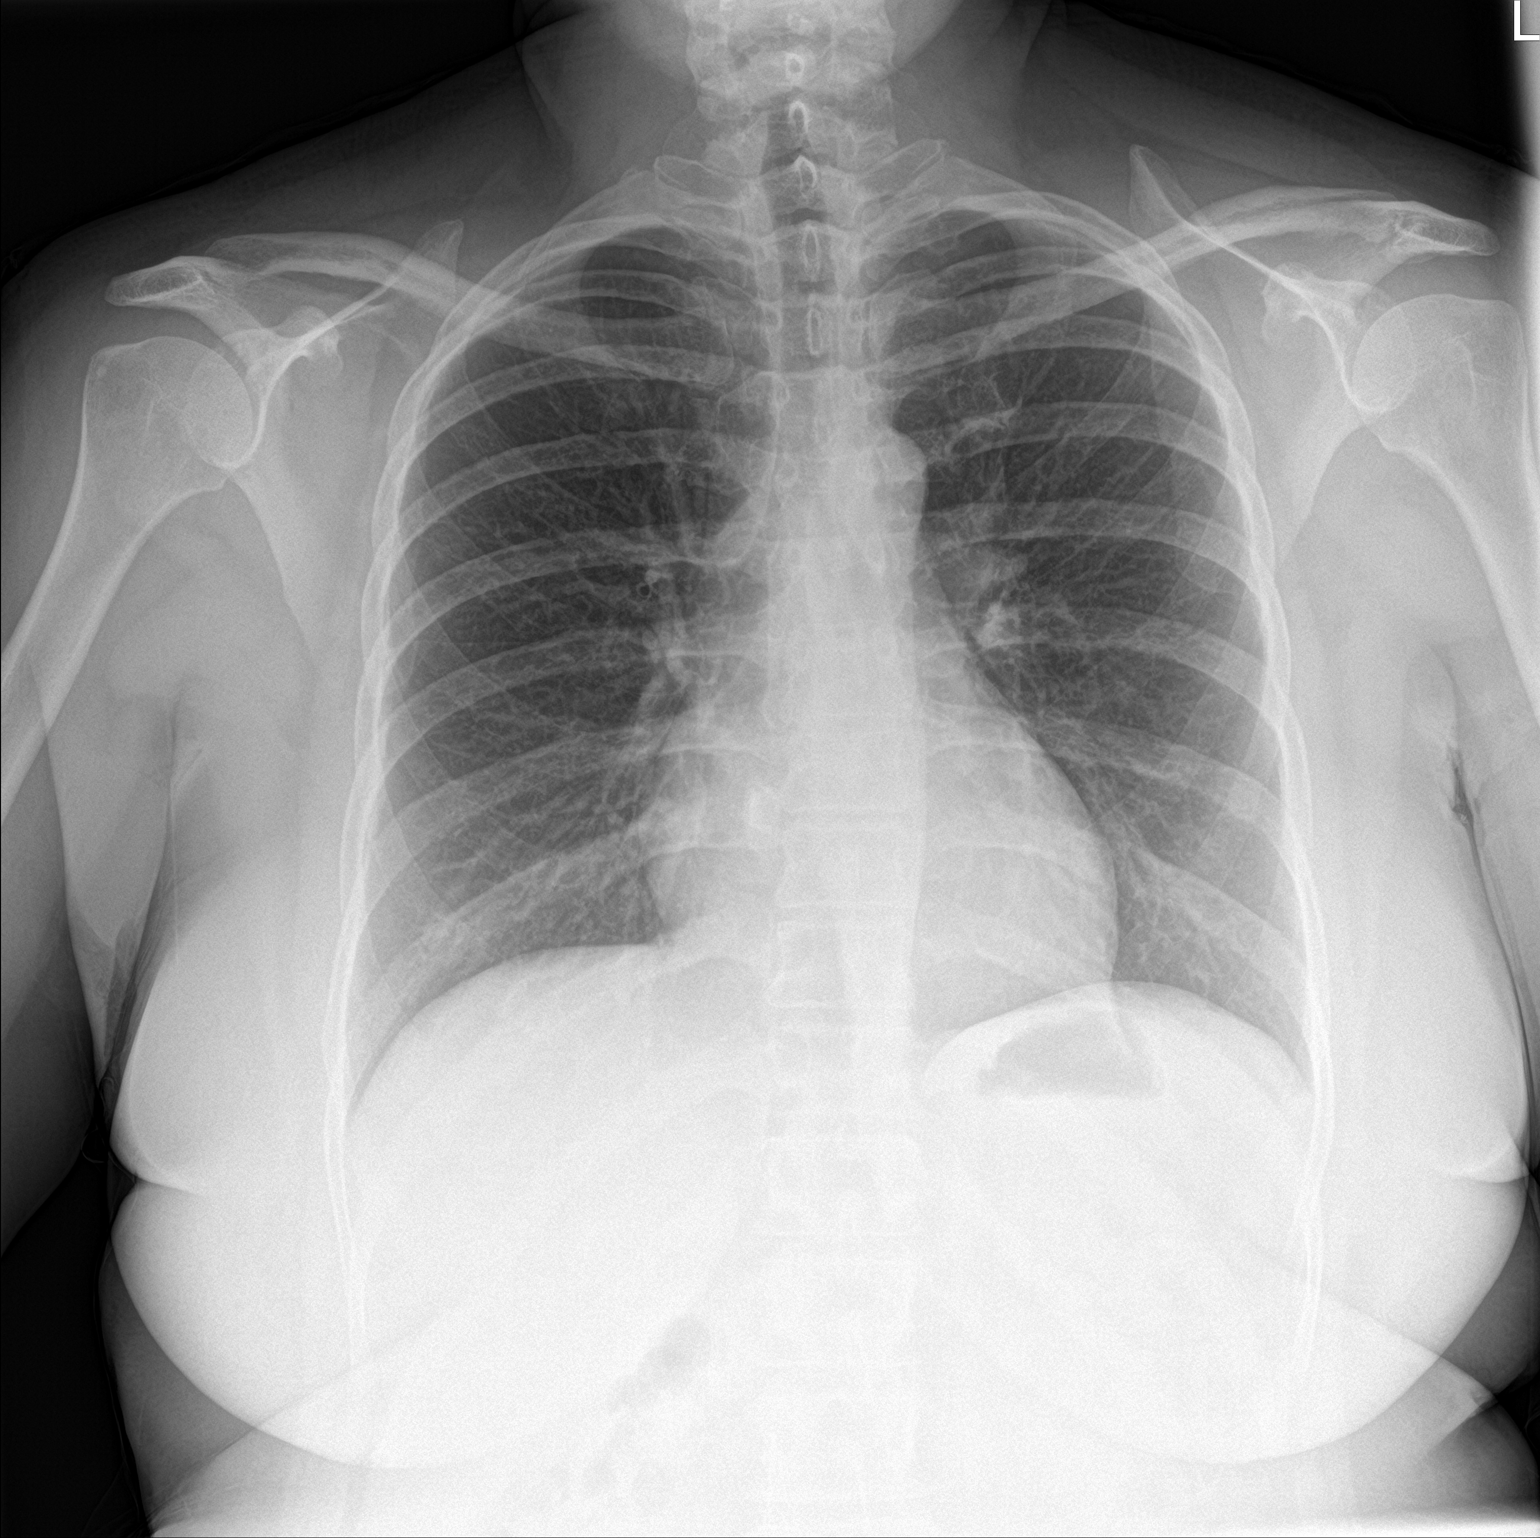

[chest lat]
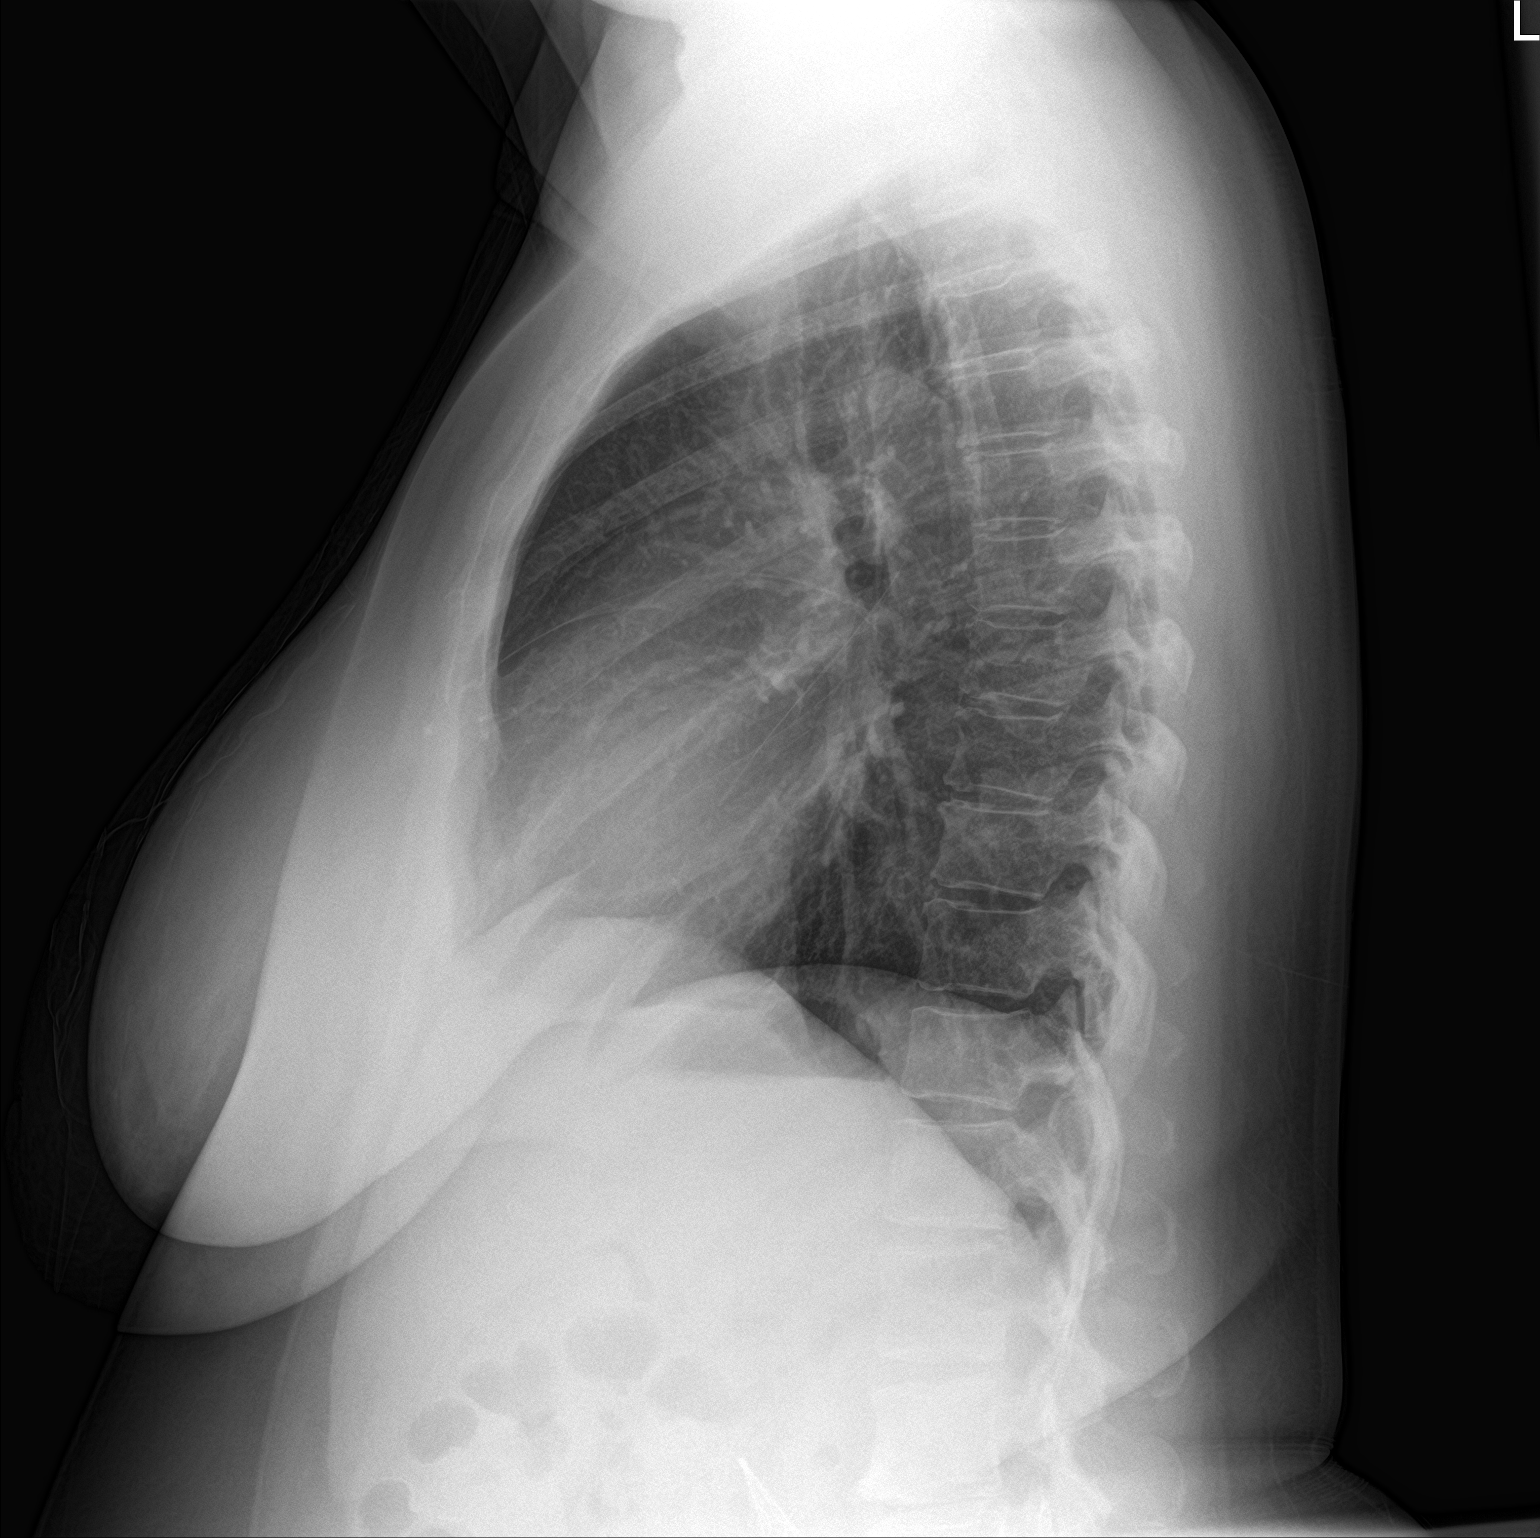

[2 of 2 positions shown; findings below may reference images not displayed]

FINDINGS: The heart size and mediastinal contours are within normal limits.
Both lungs are clear. The visualized skeletal structures are
unremarkable. An inferior vena cava filter is partially imaged.
IMPRESSION: No active cardiopulmonary disease.

## 2022-05-05 ENCOUNTER — Ambulatory Visit (INDEPENDENT_AMBULATORY_CARE_PROVIDER_SITE_OTHER): Payer: Commercial Managed Care - PPO | Admitting: Physical Therapy

## 2022-05-05 ENCOUNTER — Encounter: Payer: Self-pay | Admitting: Physical Therapy

## 2022-05-05 DIAGNOSIS — M79605 Pain in left leg: Secondary | ICD-10-CM

## 2022-05-05 DIAGNOSIS — R262 Difficulty in walking, not elsewhere classified: Secondary | ICD-10-CM

## 2022-05-05 DIAGNOSIS — M6281 Muscle weakness (generalized): Secondary | ICD-10-CM | POA: Diagnosis not present
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
## 2022-05-07 ENCOUNTER — Encounter (HOSPITAL_BASED_OUTPATIENT_CLINIC_OR_DEPARTMENT_OTHER): Payer: Self-pay | Admitting: Cardiology

## 2022-05-07 NOTE — Telephone Encounter (Signed)
See mychart response. Please schedule for a follow up visit so we can discuss further, thank you.

## 2022-05-13 ENCOUNTER — Telehealth (HOSPITAL_BASED_OUTPATIENT_CLINIC_OR_DEPARTMENT_OTHER): Payer: Self-pay

## 2022-05-13 NOTE — Telephone Encounter (Signed)
Please ask patient to cut the 25 mg tablet in half, and just take 1/2 tablet daily.  That is the lowest strength available for metoprolol.  If her symptoms return with just the 1/2 tablet daily, she should let us know as we'll have to find a different medication.  Ask that she monitor palpitations, to see if they become more frequent or not.

## 2022-05-13 NOTE — Telephone Encounter (Signed)
The following messages were received via pt. Schedule request, please advise,    Pt message: Gm. I started to take the meteprol again and I started feeling the same way. I would like to know if a lower dosage can be sent in to my pharmacy. So I can start taking it and see if that helps until I see her next week. I'm thinking the 25 mg is taking my blood pressure down to much and without my meds my blood pressure is pretty stablized. Maybe a 10 mg will be considered. Thank you. My phamry on [employer]. So I can pick it up today   RN Response: Good morning,    When did you restart the medication and how many days did you take it? Can you send me your recent blood pressure logs. We need to compare your pressures on and off the medications.    Pt message:  Started back first week of October. Stopped taking last week and I noticed my dizziness subsided..  I don't have a record of my blood pressure. I am taking this not for blood pressure monitoring. My pressure has been stable . While I was off for the few months bck my pressure was good as well. I just think I may need a smaller dosage. I read the pharmacy notes and this one of the side effects and if it continues after the first 2 weeks to let your provider know. I asked an no reply to my dosage.  I'm not gonna continue to take a med that is not making me feel normal and dizziness is not normal.     Routing to Dr. Lajean Saver for advisement

## 2022-05-19 ENCOUNTER — Ambulatory Visit (INDEPENDENT_AMBULATORY_CARE_PROVIDER_SITE_OTHER): Payer: Commercial Managed Care - PPO | Admitting: Physical Therapy

## 2022-05-19 ENCOUNTER — Encounter: Payer: Self-pay | Admitting: Physical Therapy

## 2022-05-19 ENCOUNTER — Telehealth: Payer: Self-pay | Admitting: Physical Therapy

## 2022-05-19 DIAGNOSIS — M6281 Muscle weakness (generalized): Secondary | ICD-10-CM | POA: Diagnosis not present

## 2022-05-19 DIAGNOSIS — M79605 Pain in left leg: Secondary | ICD-10-CM

## 2022-05-19 DIAGNOSIS — R262 Difficulty in walking, not elsewhere classified: Secondary | ICD-10-CM | POA: Diagnosis not present
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
## 2022-05-19 NOTE — Telephone Encounter (Signed)
I just called pt to follow up after her missed 1:00 appointment. Pt stating she was stuck in traffic and on her way. Pt was offered a 1:45 opening and her appointment was moved.  Narda Amber, PT, MPT 05/19/22 1:31 PM
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
## 2022-05-26 ENCOUNTER — Encounter: Payer: Self-pay | Admitting: Physical Therapy

## 2022-05-26 ENCOUNTER — Ambulatory Visit (INDEPENDENT_AMBULATORY_CARE_PROVIDER_SITE_OTHER): Payer: Commercial Managed Care - PPO | Admitting: Physical Therapy

## 2022-05-26 DIAGNOSIS — R262 Difficulty in walking, not elsewhere classified: Secondary | ICD-10-CM | POA: Diagnosis not present

## 2022-05-26 DIAGNOSIS — M6281 Muscle weakness (generalized): Secondary | ICD-10-CM | POA: Diagnosis not present

## 2022-05-26 DIAGNOSIS — M79605 Pain in left leg: Secondary | ICD-10-CM | POA: Diagnosis not present
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
## 2022-05-27 ENCOUNTER — Encounter: Payer: Commercial Managed Care - PPO | Admitting: Physical Therapy

## 2022-05-28 ENCOUNTER — Ambulatory Visit (HOSPITAL_BASED_OUTPATIENT_CLINIC_OR_DEPARTMENT_OTHER): Payer: Commercial Managed Care - PPO | Admitting: Cardiology

## 2022-06-02 ENCOUNTER — Encounter: Payer: Self-pay | Admitting: Internal Medicine

## 2022-06-03 ENCOUNTER — Encounter: Payer: Self-pay | Admitting: Physical Therapy

## 2022-06-03 ENCOUNTER — Ambulatory Visit (INDEPENDENT_AMBULATORY_CARE_PROVIDER_SITE_OTHER): Payer: Commercial Managed Care - PPO | Admitting: Physical Therapy

## 2022-06-03 DIAGNOSIS — R262 Difficulty in walking, not elsewhere classified: Secondary | ICD-10-CM | POA: Diagnosis not present

## 2022-06-03 DIAGNOSIS — M79605 Pain in left leg: Secondary | ICD-10-CM | POA: Diagnosis not present

## 2022-06-03 DIAGNOSIS — M6281 Muscle weakness (generalized): Secondary | ICD-10-CM | POA: Diagnosis not present
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
## 2022-06-04 ENCOUNTER — Telehealth: Payer: Self-pay | Admitting: Internal Medicine

## 2022-06-04 NOTE — Telephone Encounter (Signed)
Zobro, Gaetana Michaelis, [place], RN Hey, I've called her 3 times now, but she hasn't answered or called back. I'm sending her a letter now as well.  Thanks!       Previous Messages    ----- Message ----- From: Lindell Spar, RN Sent: 05/15/2022  10:01 AM EST To: Corliss Blacker! Any luck getting her a lipid f/u? I know she sees Corporate treasurer soon  ----- Message ----- From: Alver Sorrow, NP Sent: 04/19/2022   4:12 PM EST To: Lindell Spar, RN; C[facility] Dwb Triage  Total cholesterol and LDL (bad cholesterol) remain very elevated. Please ensure taking Rosuvastatin 5mg  daily as prescribes. Ultimately, will likely require PCSK9i which she has discussed with Dr. previously. Recommend scheduling overdue follow up with Dr. Rennis [facility] as FYI for scheduling assistance)

## 2022-06-15 NOTE — Progress Notes (Incomplete)
Cardiology Office Note:    Date:  06/15/2022   ID:  [name], DOB 04-03-1971, MRN [id number]  PCP:  Kristina Perna, NP  Cardiologist:  Buford Dresser, MD  Referring MD: Kristina Perna, NP   CC: follow up  History of Present Illness:    Kristina Krause is a 52 y.o. 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 I initially met her 06/12/19 via televisit for chest pain.  Cardiac history: MI treated in 2016 at [facility] in [place] (now [place]. [place] Had [place] 06/2019 for atypical chest pain--low risk, normal EF, small scar but no ischemia.   Seen by my colleague Dr. Margaretann Loveless for urgent visit 07/05/20. Found to have age indeterminate DVT on vascular ultrasounds 1/28. Had negative CTPE 1/26. Started on apixaban. Echo 2/17 showed normal LVEF, normal RV function, normal PASP. There is aortic sclerosis without stenosis.  She was in the ED 02/09/2021 for palpitations. Her palpitations were becoming more frequent, but she denied any major lifestyle changes. Sometimes during her palpitations she felt pre-syncopal. We discussed repeating a monitor vs. a Kardiamobile; she preferred to continue tracking her symptoms. She had been on/off her statin. Encouraged her to take rosuvastatin as much as she can tolerate.  She called the office 11/18/2021 reporting intermittent dizzy spells. During these spells her blood pressure and heart rate was normal, so it was thought she may have an inner ear issue. She was advised to follow-up with her PCP.  On 03/23/2022 she reported experiencing palpitations and irregular heart beats. She had stopped her metoprolol in March due to insurance coverage, and asked to restart metoprolol. It was agreed to resume Toprol 25 mg daily. She also requested a follow-up visit for consideration of echo or angiogram.  She presented to the ED 04/04/22  with intermittent room-spinning dizziness for several weeks. She was given meclizine. She had slipped and fell while in the waiting room, hitting her head and injuring her LLE. Xrays were negative for acute injury, there was OA. CT head was negative. It was suspected she suffered a muscle strain in her hamstring.   At her last appointment she continued to have episodes of palpitations. She had resumed metoprolol which helped somewhat. Also complained of periodic lightheadedness for 2 weeks. Since her mechanical fall while at the ED 03/2022 she noted worse LE edema.  On 05/13/22 she complained of ongoing dizziness. She had stopped taking the metoprolol for one week and noticed that her dizziness subsided. She requested a smaller dose, so she was advised to take 1/2 tablet daily of her 25 mg metoprolol.  Today,  She denies any palpitations, chest pain, shortness of breath, or peripheral edema. No lightheadedness, headaches, syncope, orthopnea, or PND.  (+)  Past Medical History:  Diagnosis Date   Coronary artery disease    DVT (deep vein thrombosis) in pregnancy    Headache    Myocardial infarct [facility])     Past Surgical History:  Procedure Laterality Date   CORONARY STENT PLACEMENT  05/2015    Current Medications: Current Outpatient Medications on File Prior to Visit  Medication Sig   aspirin EC 81 MG tablet Take 81 mg by mouth daily. Swallow whole.   lisinopril (ZESTRIL) 2.5 MG tablet TAKE 1 TABLET(2.5 MG) BY MOUTH DAILY   meclizine (ANTIVERT) 25 MG tablet Take 1 tablet (25 mg total) by mouth  3 (three) times daily as needed for dizziness.   methocarbamol (ROBAXIN) 500 MG tablet Take 1 tablet (500 mg total) by mouth 2 (two) times daily.   metoprolol succinate (TOPROL-XL) 25 MG 24 hr tablet Take 1 tablet (25 mg total) by mouth daily.   naproxen (NAPROSYN) 500 MG tablet Take 1 tablet (500 mg total) by mouth 2 (two) times daily.   rosuvastatin (CRESTOR) 5 MG tablet Take 1 tablet (5 mg  total) by mouth daily.   trimethoprim-polymyxin b (POLYTRIM) ophthalmic solution Place 1 drop into both eyes every 4 (four) hours.   No current facility-administered medications on file prior to visit.     Allergies:   Aspirin and Ibuprofen   Social History   Tobacco Use   Smoking status: Former    Types: Cigarettes   Smokeless tobacco: Never  Vaping Use   Vaping Use: Never used  Substance Use Topics   Alcohol use: Yes    Comment: occ   Drug use: Never    Family History: Mother and father each had MI later in life. Father died age 36, mother is living in her 61s  ROS:   Please see the history of present illness.    Additional pertinent ROS otherwise unremarkable    EKGs/Labs/Other Studies Reviewed:    The following studies were reviewed today:  CT Venogram Head 10/15/2020: COMPARISON:  Arterial study same day   FINDINGS: Sagittal superior sagittal sinus is widely patent. Both transverse sinuses appear normal. Both sigmoid sinuses are normal. Flow is present in both jugular veins. Deep venous system appears normal. No evidence of superficial thrombosis.   IMPRESSION: Normal intracranial CT venography.  CTA Head/Neck 10/15/2020: COMPARISON:  10/03/2020   FINDINGS: CT HEAD FINDINGS Brain: The brain has normal appearance without evidence of atrophy, old or acute infarction, mass lesion, hemorrhage, hydrocephalus or extra-axial collection.   Vascular: No abnormal vascular finding.   Skull: Normal   Sinuses: Clear   Orbits: Normal   Review of the MIP images confirms the above findings   CTA NECK FINDINGS Aortic arch: Aortic arch shows some atherosclerotic calcification. Question the presence of coronary artery calcification.   Right carotid system: Common carotid artery widely patent to the bifurcation. Carotid bifurcation is normal without soft or calcified plaque. Cervical ICA is normal. No sign of dissection.   Left carotid system: Common carotid  artery widely patent to the bifurcation. Carotid bifurcation is normal. Cervical ICA is normal. No sign of dissection.   Vertebral arteries: Both vertebral artery origins widely patent. Both vertebral arteries appear normal through the cervical region to the foramen magnum.   Skeleton: Normal   Other neck: No mass or lymphadenopathy. No sign of soft tissue neck injury.   Upper chest: Normal   Review of the MIP images confirms the above findings   CTA HEAD FINDINGS Anterior circulation: Both internal carotid arteries are widely patent through the skull base and siphon regions. There is some siphon atherosclerotic calcification but no stenosis. The anterior and middle cerebral vessels appear patent. No large vessel occlusion. No aneurysm or vascular malformation.   Posterior circulation: Both vertebral arteries widely patent to the basilar. No basilar stenosis. Posterior circulation branch vessels appear normal.   Venous sinuses: Patent and normal.   Anatomic variants: None significant.   Review of the MIP images confirms the above findings   IMPRESSION: Aortic atherosclerosis.  Coronary artery calcification.   Carotid bifurcations appear normal.   No evidence of vascular injury affecting the carotid or  vertebral vessels.   No intracranial vessel pathologic finding.  Echo 07/25/20  1. Left ventricular ejection fraction, by estimation, is 55 to 60%. The  left ventricle has normal function. The left ventricle has no regional  wall motion abnormalities. Left ventricular diastolic parameters were  normal.   2. Right ventricular systolic function is normal. The right ventricular  size is normal. There is normal pulmonary artery systolic pressure.   3. The mitral valve is normal in structure. Trivial mitral valve  regurgitation.   4. The aortic valve is abnormal. Aortic valve regurgitation is not  visualized. Mild to moderate aortic valve sclerosis/calcification is   present, without any evidence of aortic stenosis.   5. The inferior vena cava is normal in size with greater than 50%  respiratory variability, suggesting right atrial pressure of 3 mmHg.   Vasc u/s 07/05/20 Summary:  RIGHT:  - No evidence of deep vein thrombosis in the lower extremity. No indirect  evidence of obstruction proximal to the inguinal ligament.     LEFT:  - Findings consistent with age indeterminate non-occlusive deep vein  thrombosis in the distal SFV.   Zio 12/18/19 ~3.5 days of data recorded on Zio monitor. Patient had a min HR of 47 bpm, max HR of 138 bpm, and avg HR of 71 bpm. Predominant underlying rhythm was Sinus Rhythm. No SVT, atrial fibrillation, high degree block, or pauses noted. There was one 6 beat run of an unclear rhythm; does not appear consistent with VT (as per label) but possibly SVT with aberrancy.  Isolated atrial and ventricular ectopy was rare (<1%). There were 8 triggered events, which were sinus rhythm. Some of these events had single beats of ectopy (either PAC or PVC). No significant arrhythmias detected.  Nuclear stress 06/29/19 The left ventricular ejection fraction is normal (55-65%). Nuclear stress EF: 57%. Mild hypokinesis of the septal wall region There was no ST segment deviation noted during stress. Findings consistent with prior myocardial infarction. This is an intermediate risk study based upon prior anteroseptal infarct pattern. However, there is no ischemia identified.  EKG:  EKG is personally reviewed.   06/15/2022: 04/08/2022:  EKG was not ordered. 04/25/2021: SR with sinus arrhythmia at 67 bpm 07/05/20: sinus bradycardia  Recent Labs: 08/14/2021: TSH 2.350 04/04/2022: BUN 8; Creatinine, Ser 0.79; Hemoglobin 12.2; Platelets 345; Potassium 4.1; Sodium 137   Recent Lipid Panel    Component Value Date/Time   CHOL 298 (H) 04/08/2022 1009   TRIG 112 04/08/2022 1009   HDL 58 04/08/2022 1009   CHOLHDL 5.1 (H) 04/08/2022 1009    LDLCALC 220 (H) 04/08/2022 1009    Physical Exam:    VS:  There were no vitals taken for this visit.    Wt Readings from Last 3 Encounters:  04/08/22 225 lb 12.8 oz (102.4 kg)  08/14/21 227 lb 9.6 oz (103.2 kg)  04/25/21 216 lb 9.6 oz (98.2 kg)    GEN: Well nourished, well developed in no acute distress HEENT: Normal, moist mucous membranes NECK: No JVD CARDIAC: regular rhythm, normal S1 and S2, no rubs or gallops. No murmur. VASCULAR: Radial and DP pulses 2+ bilaterally. No carotid bruits RESPIRATORY:  Clear to auscultation without rales, wheezing or rhonchi  ABDOMEN: Soft, non-tender, non-distended MUSCULOSKELETAL:  Ambulates independently SKIN: Warm and dry, ***trace LE edema NEUROLOGIC:  Alert and oriented x 3. No focal neuro deficits noted. PSYCHIATRIC:  Normal affect   ASSESSMENT:    No diagnosis found.  PLAN:    Palpitations: -difficult  to determine if she feels poorly from palpitations, lightheadedness, metoprolol, or another reason -she will monitor her symptoms to try to find a pattern. Discussed Kardiamobile again -reviewed red flag warning signs that need immediate medical attention  Recurrent DVT: -with two events, most recent unprovoked, would consider lifelong anticoagulation. However she elected to stop anticoagulation and has not had recurrent events.   History of CAD with prior MI and PCI in [place]:  -no angina -lexiscan without ischemia 06/2019 -continue aspirin 81 mg -on metoprolol and lisinopril. Unclear what her EF was at the time of her MI. EF on nuclear stress 57% -statin as below -counseled on red flag warning signs that need immediate medical attention  Mixed hyperlipidemia, likely familial given the highly elevated LDL -has been on/off statin. Discussed importance of secondary prevention. Goal LDL <70, will recheck today. Encouraged her to take rosuvastatin as much as she can tolerate, otherwise we can discuss alternative therapy  Updated  to add: Tchol 298, HDL 58, LDL 220, TG 112. If she cannot tolerate statin would consider PCKSK9i given her CAD. Discuss at follow up.   Cardiac risk counseling and prevention recommendations: Secondary prevention given known history, also with family history of heart disease. -recommend heart healthy/Mediterranean diet, with whole grains, fruits, vegetable, fish, lean meats, nuts, and olive oil. Limit salt. -recommend moderate walking, 3-5 times/week for 30-50 minutes each session. Aim for at least 150 minutes.week. Goal should be pace of 3 miles/hours, or walking 1.5 miles in 30 minutes -recommend avoidance of tobacco products. Avoid excess alcohol.  Plan for follow up: ***6 months or sooner as needed.  Jodelle Red, MD, PhD [place]  [facility]    Medication Adjustments/Labs and Tests Ordered: Current medicines are reviewed at length with the patient today.  Concerns regarding medicines are outlined above.   No orders of the defined types were placed in this encounter.  No orders of the defined types were placed in this encounter.  There are no Patient Instructions on file for this visit.   I,Mathew Stumpf,acting as a Neurosurgeon for [employer], MD.,have documented all relevant documentation on the behalf of Jodelle Red, MD,as directed by  Jodelle Red, MD while in the presence of Jodelle Red, MD.  I, Carlena Bjornstad, have reviewed all documentation for this visit. The documentation on 04/08/22 for the exam, diagnosis, procedures, and orders are all accurate and complete.   Signed, Jodelle Red, MD PhD 06/15/2022     [facility]

## 2022-06-17 ENCOUNTER — Ambulatory Visit (INDEPENDENT_AMBULATORY_CARE_PROVIDER_SITE_OTHER): Payer: Commercial Managed Care - PPO | Admitting: Primary Care

## 2022-06-17 ENCOUNTER — Ambulatory Visit (HOSPITAL_BASED_OUTPATIENT_CLINIC_OR_DEPARTMENT_OTHER): Payer: Commercial Managed Care - PPO | Admitting: Cardiology

## 2022-06-17 ENCOUNTER — Encounter: Payer: Commercial Managed Care - PPO | Admitting: Rehabilitative and Restorative Service Providers"

## 2022-06-17 ENCOUNTER — Encounter: Payer: Commercial Managed Care - PPO | Admitting: Physical Therapy

## 2022-06-24 ENCOUNTER — Encounter: Payer: Commercial Managed Care - PPO | Admitting: Physical Therapy

## 2022-06-25 ENCOUNTER — Encounter (HOSPITAL_BASED_OUTPATIENT_CLINIC_OR_DEPARTMENT_OTHER): Payer: Self-pay

## 2022-06-25 DIAGNOSIS — I2510 Atherosclerotic heart disease of native coronary artery without angina pectoris: Secondary | ICD-10-CM

## 2022-06-25 MED ORDER — METOPROLOL SUCCINATE ER 25 MG PO TB24: 12.5000 mg | ORAL_TABLET | Freq: Every day | ORAL | 3 refills | Status: AC

## 2022-06-26 ENCOUNTER — Telehealth: Payer: Self-pay | Admitting: Primary Care

## 2022-06-26 ENCOUNTER — Other Ambulatory Visit (HOSPITAL_BASED_OUTPATIENT_CLINIC_OR_DEPARTMENT_OTHER): Payer: Self-pay | Admitting: Family

## 2022-06-26 DIAGNOSIS — I2510 Atherosclerotic heart disease of native coronary artery without angina pectoris: Secondary | ICD-10-CM

## 2022-06-26 NOTE — Telephone Encounter (Signed)
Copied from [place] [id number]. Topic: General - Other >> Jun 26, 2022 11:04 AM Oley Balm A wrote: Reason for CRM: Erasmo Downer from [facility] is needing another medical record request sent over from 04/04/2022 to present and will need all future visits sent to them as well.

## 2022-06-26 NOTE — Telephone Encounter (Signed)
This will need to be addressed with the medical records dept

## 2022-07-01 ENCOUNTER — Encounter: Payer: Self-pay | Admitting: Physical Therapy

## 2022-07-01 ENCOUNTER — Ambulatory Visit (INDEPENDENT_AMBULATORY_CARE_PROVIDER_SITE_OTHER): Payer: Commercial Managed Care - PPO | Admitting: Physical Therapy

## 2022-07-01 DIAGNOSIS — M6281 Muscle weakness (generalized): Secondary | ICD-10-CM

## 2022-07-01 DIAGNOSIS — M79605 Pain in left leg: Secondary | ICD-10-CM

## 2022-07-01 DIAGNOSIS — R262 Difficulty in walking, not elsewhere classified: Secondary | ICD-10-CM | POA: Diagnosis not present
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
## 2022-07-08 ENCOUNTER — Ambulatory Visit (INDEPENDENT_AMBULATORY_CARE_PROVIDER_SITE_OTHER): Payer: Commercial Managed Care - PPO

## 2022-07-08 ENCOUNTER — Encounter (INDEPENDENT_AMBULATORY_CARE_PROVIDER_SITE_OTHER): Payer: Self-pay

## 2022-07-15 ENCOUNTER — Encounter: Payer: Commercial Managed Care - PPO | Admitting: Physical Therapy

## 2022-07-21 ENCOUNTER — Ambulatory Visit (HOSPITAL_BASED_OUTPATIENT_CLINIC_OR_DEPARTMENT_OTHER): Payer: Commercial Managed Care - PPO | Admitting: Cardiology

## 2022-07-21 ENCOUNTER — Encounter (HOSPITAL_BASED_OUTPATIENT_CLINIC_OR_DEPARTMENT_OTHER): Payer: Self-pay

## 2022-07-21 NOTE — Progress Notes (Incomplete)
Cardiology Office Note:    Date:  07/21/2022   ID:  Kristina Krause, DOB [birth year], MRN [id number]  PCP:  [name], NP  Cardiologist:  Buford Dresser, MD  Referring MD: [name], NP   CC: follow up  History of Present Illness:    Kristina Krause is a 52 y.o. 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 I initially met her 06/12/19 via televisit for chest pain.  Cardiac history: MI treated in 2016 at [facility] in [place] (now [place]. [place] Had [place] 06/2019 for atypical chest pain--low risk, normal EF, small scar but no ischemia.   Seen by my colleague Dr. Margaretann Loveless for urgent visit 07/05/20. Found to have age indeterminate DVT on vascular ultrasounds 1/28. Had negative CTPE 1/26. Started on apixaban. Echo 2/17 showed normal LVEF, normal RV function, normal PASP. There is aortic sclerosis without stenosis.  She was in the ED 02/09/2021 for palpitations. At her last visit her palpitations were becoming more frequent, but she denied any major lifestyle changes. Sometimes during her palpitations she felt pre-syncopal. We discussed repeating a monitor vs. a Kardiamobile; she preferred to continue tracking her symptoms for now. She had been on/off her statin. Encouraged her to take rosuvastatin as much as she can tolerate.  She called the office 11/18/2021 reporting intermittent dizzy spells. During these spells her blood pressure and heart rate was normal, so it was thought she may have an inner ear issue. She was advised to follow-up with her PCP.  On 03/23/2022 she reported experiencing palpitations and irregular heart beats. She had stopped her metoprolol in March due to insurance coverage, and asked to restart metoprolol. It was agreed to resume Toprol 25 mg daily. She also requested a follow-up visit for consideration of echo or angiogram.  She  presented to the ED 04/04/22 with intermittent room-spinning dizziness for several weeks. She was given meclizine. She had slipped and fell while in the waiting room, hitting her head and injuring her LLE. Xrays were negative for acute injury, there was OA. CT head was negative. It was suspected she suffered a muscle strain in her hamstring.   Today, she is still feeling episodes of palpitations that are distinctly different from how she feels during anxiety attacks. For the last 3 months she has felt "off". She had resumed her metoprolol which helps somewhat, but her palpitations are still noticeable.   For the last 2 weeks she has also been struggling with lightheadedness. This is periodic, and causes her to go sit down until her symptoms subside. Sometimes after she eats the lightheadedness is worse, but it may occur at random times.   She endorses LE edema that has been worse since her mechanical fall while at the ED 04/04/22.  She denies any chest pain, shortness of breath, headaches, syncope, orthopnea, or PND.   Past Medical History:  Diagnosis Date   Coronary artery disease    DVT (deep vein thrombosis) in pregnancy    Headache    Myocardial infarct [facility])     Past Surgical History:  Procedure Laterality Date   CORONARY STENT PLACEMENT  05/2015    Current Medications: Current Outpatient Medications on File Prior to Visit  Medication Sig   aspirin EC 81 MG tablet Take 81 mg by mouth daily. Swallow whole.   lisinopril (ZESTRIL) 2.5 MG tablet TAKE 1 TABLET(2.5  MG) BY MOUTH DAILY   meclizine (ANTIVERT) 25 MG tablet Take 1 tablet (25 mg total) by mouth 3 (three) times daily as needed for dizziness.   methocarbamol (ROBAXIN) 500 MG tablet Take 1 tablet (500 mg total) by mouth 2 (two) times daily.   metoprolol succinate (TOPROL-XL) 25 MG 24 hr tablet Take 0.5 tablets (12.5 mg total) by mouth daily.   naproxen (NAPROSYN) 500 MG tablet Take 1 tablet (500 mg total) by mouth 2 (two) times  daily.   rosuvastatin (CRESTOR) 5 MG tablet Take 1 tablet (5 mg total) by mouth daily.   trimethoprim-polymyxin b (POLYTRIM) ophthalmic solution Place 1 drop into both eyes every 4 (four) hours.   No current facility-administered medications on file prior to visit.     Allergies:   Aspirin and Ibuprofen   Social History   Tobacco Use   Smoking status: Former    Types: Cigarettes   Smokeless tobacco: Never  Vaping Use   Vaping Use: Never used  Substance Use Topics   Alcohol use: Yes    Comment: occ   Drug use: Never    Family History: Mother and father each had MI later in life. Father died age 16, mother is living in her 31s  ROS:   Please see the history of present illness.   (+) Palpitations (+) Lightheadedness (+) LE edema (+) Mechanical fall Additional pertinent ROS otherwise unremarkable    EKGs/Labs/Other Studies Reviewed:    The following studies were reviewed today:  CT Venogram Head 10/15/2020: COMPARISON:  Arterial study same day   FINDINGS: Sagittal superior sagittal sinus is widely patent. Both transverse sinuses appear normal. Both sigmoid sinuses are normal. Flow is present in both jugular veins. Deep venous system appears normal. No evidence of superficial thrombosis.   IMPRESSION: Normal intracranial CT venography.  CTA Head/Neck 10/15/2020: COMPARISON:  10/03/2020   FINDINGS: CT HEAD FINDINGS Brain: The brain has normal appearance without evidence of atrophy, old or acute infarction, mass lesion, hemorrhage, hydrocephalus or extra-axial collection.   Vascular: No abnormal vascular finding.   Skull: Normal   Sinuses: Clear   Orbits: Normal   Review of the MIP images confirms the above findings   CTA NECK FINDINGS Aortic arch: Aortic arch shows some atherosclerotic calcification. Question the presence of coronary artery calcification.   Right carotid system: Common carotid artery widely patent to the bifurcation. Carotid  bifurcation is normal without soft or calcified plaque. Cervical ICA is normal. No sign of dissection.   Left carotid system: Common carotid artery widely patent to the bifurcation. Carotid bifurcation is normal. Cervical ICA is normal. No sign of dissection.   Vertebral arteries: Both vertebral artery origins widely patent. Both vertebral arteries appear normal through the cervical region to the foramen magnum.   Skeleton: Normal   Other neck: No mass or lymphadenopathy. No sign of soft tissue neck injury.   Upper chest: Normal   Review of the MIP images confirms the above findings   CTA HEAD FINDINGS Anterior circulation: Both internal carotid arteries are widely patent through the skull base and siphon regions. There is some siphon atherosclerotic calcification but no stenosis. The anterior and middle cerebral vessels appear patent. No large vessel occlusion. No aneurysm or vascular malformation.   Posterior circulation: Both vertebral arteries widely patent to the basilar. No basilar stenosis. Posterior circulation branch vessels appear normal.   Venous sinuses: Patent and normal.   Anatomic variants: None significant.   Review of the MIP images confirms the  above findings   IMPRESSION: Aortic atherosclerosis.  Coronary artery calcification.   Carotid bifurcations appear normal.   No evidence of vascular injury affecting the carotid or vertebral vessels.   No intracranial vessel pathologic finding.  Echo 07/25/20  1. Left ventricular ejection fraction, by estimation, is 55 to 60%. The  left ventricle has normal function. The left ventricle has no regional  wall motion abnormalities. Left ventricular diastolic parameters were  normal.   2. Right ventricular systolic function is normal. The right ventricular  size is normal. There is normal pulmonary artery systolic pressure.   3. The mitral valve is normal in structure. Trivial mitral valve  regurgitation.    4. The aortic valve is abnormal. Aortic valve regurgitation is not  visualized. Mild to moderate aortic valve sclerosis/calcification is  present, without any evidence of aortic stenosis.   5. The inferior vena cava is normal in size with greater than 50%  respiratory variability, suggesting right atrial pressure of 3 mmHg.   Vasc u/s 07/05/20 Summary:  RIGHT:  - No evidence of deep vein thrombosis in the lower extremity. No indirect  evidence of obstruction proximal to the inguinal ligament.     LEFT:  - Findings consistent with age indeterminate non-occlusive deep vein  thrombosis in the distal SFV.   Zio 12/18/19 ~3.5 days of data recorded on Zio monitor. Patient had a min HR of 47 bpm, max HR of 138 bpm, and avg HR of 71 bpm. Predominant underlying rhythm was Sinus Rhythm. No SVT, atrial fibrillation, high degree block, or pauses noted. There was one 6 beat run of an unclear rhythm; does not appear consistent with VT (as per label) but possibly SVT with aberrancy.  Isolated atrial and ventricular ectopy was rare (<1%). There were 8 triggered events, which were sinus rhythm. Some of these events had single beats of ectopy (either PAC or PVC). No significant arrhythmias detected.  Nuclear stress 06/29/19 The left ventricular ejection fraction is normal (55-65%). Nuclear stress EF: 57%. Mild hypokinesis of the septal wall region There was no ST segment deviation noted during stress. Findings consistent with prior myocardial infarction. This is an intermediate risk study based upon prior anteroseptal infarct pattern. However, there is no ischemia identified.  EKG:  EKG is personally reviewed.   04/08/2022:  EKG was not ordered. 04/25/2021: SR with sinus arrhythmia at 67 bpm 07/05/20: sinus bradycardia  Recent Labs: 08/14/2021: TSH 2.350 04/04/2022: BUN 8; Creatinine, Ser 0.79; Hemoglobin 12.2; Platelets 345; Potassium 4.1; Sodium 137   Recent Lipid Panel    Component Value Date/Time    CHOL 298 (H) 04/08/2022 1009   TRIG 112 04/08/2022 1009   HDL 58 04/08/2022 1009   CHOLHDL 5.1 (H) 04/08/2022 1009   LDLCALC 220 (H) 04/08/2022 1009    Physical Exam:    VS:  There were no vitals taken for this visit.    Wt Readings from Last 3 Encounters:  04/08/22 225 lb 12.8 oz (102.4 kg)  08/14/21 227 lb 9.6 oz (103.2 kg)  04/25/21 216 lb 9.6 oz (98.2 kg)    GEN: Well nourished, well developed in no acute distress HEENT: Normal, moist mucous membranes NECK: No JVD CARDIAC: regular rhythm, normal S1 and S2, no rubs or gallops. No murmur. VASCULAR: Radial and DP pulses 2+ bilaterally. No carotid bruits RESPIRATORY:  Clear to auscultation without rales, wheezing or rhonchi  ABDOMEN: Soft, non-tender, non-distended MUSCULOSKELETAL:  Ambulates independently SKIN: Warm and dry, trace LE edema NEUROLOGIC:  Alert and oriented  x 3. No focal neuro deficits noted. PSYCHIATRIC:  Normal affect   ASSESSMENT:    No diagnosis found.  PLAN:    Palpitations: -difficult to determine if she feels poorly from palpitations, lightheadedness, metoprolol, or another reason -she will monitor her symptoms to try to find a pattern. Discussed Kardiamobile again -reviewed red flag warning signs that need immediate medical attention  Recurrent DVT: -with two events, most recent unprovoked, would consider lifelong anticoagulation. However she elected to stop anticoagulation and has not had recurrent events.   History of CAD with prior MI and PCI in [place]:  -no angina -lexiscan without ischemia 06/2019 -continue aspirin 81 mg -on metoprolol and lisinopril. Unclear what her EF was at the time of her MI. EF on nuclear stress 57% -statin as below -counseled on red flag warning signs that need immediate medical attention  Mixed hyperlipidemia, likely familial given the highly elevated LDL -has been on/off statin. Discussed importance of secondary prevention. Goal LDL <70, will recheck today.  Encouraged her to take rosuvastatin as much as she can tolerate, otherwise we can discuss alternative therapy  Updated to add: Tchol 298, HDL 58, LDL 220, TG 112. If she cannot tolerate statin would consider [id number] given her CAD. Discuss at follow up.   Cardiac risk counseling and prevention recommendations: Secondary prevention given known history, also with family history of heart disease. -recommend heart healthy/Mediterranean diet, with whole grains, fruits, vegetable, fish, lean meats, nuts, and olive oil. Limit salt. -recommend moderate walking, 3-5 times/week for 30-50 minutes each session. Aim for at least 150 minutes.week. Goal should be pace of 3 miles/hours, or walking 1.5 miles in 30 minutes -recommend avoidance of tobacco products. Avoid excess alcohol.  Plan for follow up: 6 months or sooner as needed.  Buford Dresser, MD, PhD, [facility] [address] [place], [place] [address] [phone]   Medication Adjustments/Labs and Tests Ordered: Current medicines are reviewed at length with the patient today.  Concerns regarding medicines are outlined above.   No orders of the defined types were placed in this encounter.  No orders of the defined types were placed in this encounter.  There are no Patient Instructions on file for this visit.   Signed, Buford Dresser, MD PhD 07/21/2022     [place]

## 2022-07-22 ENCOUNTER — Ambulatory Visit (INDEPENDENT_AMBULATORY_CARE_PROVIDER_SITE_OTHER): Payer: Commercial Managed Care - PPO | Admitting: Physical Therapy

## 2022-07-22 ENCOUNTER — Encounter: Payer: Self-pay | Admitting: Physical Therapy

## 2022-07-22 DIAGNOSIS — M6281 Muscle weakness (generalized): Secondary | ICD-10-CM | POA: Diagnosis not present

## 2022-07-22 DIAGNOSIS — M79605 Pain in left leg: Secondary | ICD-10-CM

## 2022-07-22 DIAGNOSIS — R262 Difficulty in walking, not elsewhere classified: Secondary | ICD-10-CM | POA: Diagnosis not present
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
## 2022-07-23 ENCOUNTER — Ambulatory Visit (HOSPITAL_BASED_OUTPATIENT_CLINIC_OR_DEPARTMENT_OTHER): Payer: Commercial Managed Care - PPO | Admitting: Cardiology

## 2022-07-29 ENCOUNTER — Encounter (HOSPITAL_BASED_OUTPATIENT_CLINIC_OR_DEPARTMENT_OTHER): Payer: Self-pay | Admitting: Cardiology

## 2022-07-29 ENCOUNTER — Ambulatory Visit (INDEPENDENT_AMBULATORY_CARE_PROVIDER_SITE_OTHER): Payer: Commercial Managed Care - PPO | Admitting: Cardiology

## 2022-07-29 VITALS — BP 122/82 | HR 69 | Ht 65.0 in | Wt 223.3 lb

## 2022-07-29 DIAGNOSIS — I2510 Atherosclerotic heart disease of native coronary artery without angina pectoris: Secondary | ICD-10-CM

## 2022-07-29 DIAGNOSIS — E7849 Other hyperlipidemia: Secondary | ICD-10-CM | POA: Diagnosis not present

## 2022-07-29 DIAGNOSIS — R002 Palpitations: Secondary | ICD-10-CM | POA: Diagnosis not present

## 2022-07-29 DIAGNOSIS — M791 Myalgia, unspecified site: Secondary | ICD-10-CM

## 2022-07-29 DIAGNOSIS — T466X5D Adverse effect of antihyperlipidemic and antiarteriosclerotic drugs, subsequent encounter: Secondary | ICD-10-CM

## 2022-07-29 DIAGNOSIS — H8110 Benign paroxysmal vertigo, unspecified ear: Secondary | ICD-10-CM

## 2022-07-29 DIAGNOSIS — Z9861 Coronary angioplasty status: Secondary | ICD-10-CM

## 2022-07-29 NOTE — Addendum Note (Signed)
Addended by: Buford Dresser A on: 07/29/2022 01:44 PM   Modules accepted: Orders

## 2022-07-29 NOTE — Patient Instructions (Signed)
Medication Instructions:  Your physician recommends that you continue on your current medications as directed. Please refer to the Current Medication list given to you today.  *If you need a refill on your cardiac medications before your next appointment, please call your pharmacy*  Follow-Up: At [facility], you and your health needs are our priority.  As part of our continuing mission to provide you with exceptional heart care, we have created designated Provider Care Teams.  These Care Teams include your primary Cardiologist (physician) and Advanced Practice Providers (APPs -  Physician Assistants and Nurse Practitioners) who all work together to provide you with the care you need, when you need it.  We recommend signing up for the patient portal called "MyChart".  Sign up information is provided on this After Visit Summary.  MyChart is used to connect with patients for Virtual Visits (Telemedicine).  Patients are able to view lab/test results, encounter notes, upcoming appointments, etc.  Non-urgent messages can be sent to your provider as well.   To learn more about what you can do with MyChart, go to [email or web address].    Your next appointment:   November with Dr. Harrell Gave

## 2022-07-29 NOTE — Progress Notes (Signed)
Cardiology Office Note:    Date:  07/29/2022   ID:  Kristina Krause, DOB [birth year], MRN [id number]  PCP:  [name], NP  Cardiologist:  Buford Dresser, MD  Referring MD: [name], NP   CC: follow up  History of Present Illness:    Kristina Krause is a 52 y.o. 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 I initially met her 06/12/19 via televisit for chest pain.  Cardiac history: MI treated in 2016 at [facility] in [place] (now [place]. [place] Had [place] 06/2019 for atypical chest pain--low risk, normal EF, small scar but no ischemia.   Seen by my colleague Dr. Margaretann Loveless for urgent visit 07/05/20. Found to have age indeterminate DVT on vascular ultrasounds 1/28. Had negative CTPE 1/26. Started on apixaban. Echo 2/17 showed normal LVEF, normal RV function, normal PASP. There is aortic sclerosis without stenosis.  She presented to the ED 04/04/22 with intermittent room-spinning dizziness for several weeks. She was given meclizine. She had slipped and fell while in the waiting room, hitting her head and injuring her LLE. Xrays were negative for acute injury, there was OA. CT head was negative. It was suspected she suffered a muscle strain in her hamstring.   At her last visit on 04/08/2022, she was still feeling episodes of palpitations that were distinctly different from how she felt during anxiety attacks. For the previous 3 months she had felt "off". She had resumed her metoprolol which helped somewhat, but her palpitations were still noticeable.   Today, she reports she she continues to experience palpitations and dizziness. She has been experiencing the dizziness since 03/2022 and notes it has improved but continues to be a constant issue for her. She describes the dizziness is accompanied with a sensation of movement. She is unaware of any triggers  but reports that she has had these episodes while driving or when turning her head. She notices that dizziness worsens when eating.  She has been compliant with her Metoprolol 12.5 mg daily.   She has been having chronic knee pain. She believes that her pain has worsened since taking Rosuvastatin 5 mg daily.   She endorses bilateral LE edema in her ankles and feet. She states this is not unusual for her.   She denies any chest pain and shortness of breath. No headaches, syncope, orthopnea, or PND.  Past Medical History:  Diagnosis Date   Coronary artery disease    DVT (deep vein thrombosis) in pregnancy    Headache    Myocardial infarct [facility])     Past Surgical History:  Procedure Laterality Date   CORONARY STENT PLACEMENT  05/2015    Current Medications: Current Outpatient Medications on File Prior to Visit  Medication Sig   aspirin EC 81 MG tablet Take 81 mg by mouth daily. Swallow whole.   lisinopril (ZESTRIL) 2.5 MG tablet TAKE 1 TABLET(2.5 MG) BY MOUTH DAILY   meclizine (ANTIVERT) 25 MG tablet Take 1 tablet (25 mg total) by mouth 3 (three) times daily as needed for dizziness.   methocarbamol (ROBAXIN) 500 MG tablet Take 1 tablet (500 mg total) by mouth 2 (two) times daily.   metoprolol succinate (TOPROL-XL) 25 MG 24 hr tablet Take 0.5 tablets (12.5 mg total) by mouth daily.   naproxen (NAPROSYN) 500 MG tablet Take 1 tablet (500 mg total) by mouth 2 (two) times  daily.   rosuvastatin (CRESTOR) 5 MG tablet Take 1 tablet (5 mg total) by mouth daily.   trimethoprim-polymyxin b (POLYTRIM) ophthalmic solution Place 1 drop into both eyes every 4 (four) hours.   No current facility-administered medications on file prior to visit.     Allergies:   Aspirin and Ibuprofen   Social History   Tobacco Use   Smoking status: Former    Types: Cigarettes   Smokeless tobacco: Never  Vaping Use   Vaping Use: Never used  Substance Use Topics   Alcohol use: Yes    Comment: occ   Drug  use: Never    Family History: Mother and father each had MI later in life. Father died age 105, mother is living in her 81s  ROS:   Please see the history of present illness.   (+) Palpitations (+) Dizziness/Lightheadedness Additional pertinent ROS otherwise unremarkable    EKGs/Labs/Other Studies Reviewed:    The following studies were reviewed today:  CT Venogram Head 10/15/2020: COMPARISON:  Arterial study same day   FINDINGS: Sagittal superior sagittal sinus is widely patent. Both transverse sinuses appear normal. Both sigmoid sinuses are normal. Flow is present in both jugular veins. Deep venous system appears normal. No evidence of superficial thrombosis.   IMPRESSION: Normal intracranial CT venography.  CTA Head/Neck 10/15/2020: COMPARISON:  10/03/2020   FINDINGS: CT HEAD FINDINGS Brain: The brain has normal appearance without evidence of atrophy, old or acute infarction, mass lesion, hemorrhage, hydrocephalus or extra-axial collection.   Vascular: No abnormal vascular finding.   Skull: Normal   Sinuses: Clear   Orbits: Normal   Review of the MIP images confirms the above findings   CTA NECK FINDINGS Aortic arch: Aortic arch shows some atherosclerotic calcification. Question the presence of coronary artery calcification.   Right carotid system: Common carotid artery widely patent to the bifurcation. Carotid bifurcation is normal without soft or calcified plaque. Cervical ICA is normal. No sign of dissection.   Left carotid system: Common carotid artery widely patent to the bifurcation. Carotid bifurcation is normal. Cervical ICA is normal. No sign of dissection.   Vertebral arteries: Both vertebral artery origins widely patent. Both vertebral arteries appear normal through the cervical region to the foramen magnum.   Skeleton: Normal   Other neck: No mass or lymphadenopathy. No sign of soft tissue neck injury.   Upper chest: Normal   Review  of the MIP images confirms the above findings   CTA HEAD FINDINGS Anterior circulation: Both internal carotid arteries are widely patent through the skull base and siphon regions. There is some siphon atherosclerotic calcification but no stenosis. The anterior and middle cerebral vessels appear patent. No large vessel occlusion. No aneurysm or vascular malformation.   Posterior circulation: Both vertebral arteries widely patent to the basilar. No basilar stenosis. Posterior circulation branch vessels appear normal.   Venous sinuses: Patent and normal.   Anatomic variants: None significant.   Review of the MIP images confirms the above findings   IMPRESSION: Aortic atherosclerosis.  Coronary artery calcification.   Carotid bifurcations appear normal.   No evidence of vascular injury affecting the carotid or vertebral vessels.   No intracranial vessel pathologic finding.  Echo 07/25/20  1. Left ventricular ejection fraction, by estimation, is 55 to 60%. The  left ventricle has normal function. The left ventricle has no regional  wall motion abnormalities. Left ventricular diastolic parameters were  normal.   2. Right ventricular systolic function is normal. The right ventricular  size is normal. There is normal pulmonary artery systolic pressure.   3. The mitral valve is normal in structure. Trivial mitral valve  regurgitation.   4. The aortic valve is abnormal. Aortic valve regurgitation is not  visualized. Mild to moderate aortic valve sclerosis/calcification is  present, without any evidence of aortic stenosis.   5. The inferior vena cava is normal in size with greater than 50%  respiratory variability, suggesting right atrial pressure of 3 mmHg.   Vasc u/s 07/05/20 Summary:  RIGHT:  - No evidence of deep vein thrombosis in the lower extremity. No indirect  evidence of obstruction proximal to the inguinal ligament.     LEFT:  - Findings consistent with age  indeterminate non-occlusive deep vein  thrombosis in the distal SFV.   Zio 12/18/19 ~3.5 days of data recorded on Zio monitor. Patient had a min HR of 47 bpm, max HR of 138 bpm, and avg HR of 71 bpm. Predominant underlying rhythm was Sinus Rhythm. No SVT, atrial fibrillation, high degree block, or pauses noted. There was one 6 beat run of an unclear rhythm; does not appear consistent with VT (as per label) but possibly SVT with aberrancy.  Isolated atrial and ventricular ectopy was rare (<1%). There were 8 triggered events, which were sinus rhythm. Some of these events had single beats of ectopy (either PAC or PVC). No significant arrhythmias detected.  Nuclear stress 06/29/19 The left ventricular ejection fraction is normal (55-65%). Nuclear stress EF: 57%. Mild hypokinesis of the septal wall region There was no ST segment deviation noted during stress. Findings consistent with prior myocardial infarction. This is an intermediate risk study based upon prior anteroseptal infarct pattern. However, there is no ischemia identified.  EKG:  EKG is personally reviewed.   07/29/2022: Sinus rhythm. Rate 69 bpm. Low septal forces 04/08/2022:  EKG was not ordered. 04/25/2021: SR with sinus arrhythmia at 67 bpm 07/05/20: sinus bradycardia  Recent Labs: 08/14/2021: TSH 2.350 04/04/2022: BUN 8; Creatinine, Ser 0.79; Hemoglobin 12.2; Platelets 345; Potassium 4.1; Sodium 137   Recent Lipid Panel    Component Value Date/Time   CHOL 298 (H) 04/08/2022 1009   TRIG 112 04/08/2022 1009   HDL 58 04/08/2022 1009   CHOLHDL 5.1 (H) 04/08/2022 1009   LDLCALC 220 (H) 04/08/2022 1009    Physical Exam:    VS:  BP 122/82 (BP Location: Left Arm, Patient Position: Sitting, Cuff Size: Large)   Pulse 69   Ht 5' 5"$  (1.651 m)   Wt 223 lb 4.8 oz (101.3 kg)   SpO2 100%   BMI 37.16 kg/m     Wt Readings from Last 3 Encounters:  07/29/22 223 lb 4.8 oz (101.3 kg)  04/08/22 225 lb 12.8 oz (102.4 kg)  08/14/21 227 lb  9.6 oz (103.2 kg)    GEN: Well nourished, well developed in no acute distress HEENT: Normal, moist mucous membranes NECK: No JVD CARDIAC: regular rhythm, normal S1 and S2, no rubs or gallops. No murmur. VASCULAR: Radial and DP pulses 2+ bilaterally. No carotid bruits RESPIRATORY:  Clear to auscultation without rales, wheezing or rhonchi  ABDOMEN: Soft, non-tender, non-distended MUSCULOSKELETAL:  Ambulates independently SKIN: Warm and dry, trace LE edema NEUROLOGIC:  Alert and oriented x 3. No focal neuro deficits noted. PSYCHIATRIC:  Normal affect   ASSESSMENT:    1. Coronary artery disease involving native coronary artery of native heart without angina pectoris   2. Benign paroxysmal positional vertigo, unspecified laterality   3. Familial hyperlipidemia  4. Palpitations   5. S/P coronary angioplasty   6. Myalgia due to statin     PLAN:    Palpitations Intermittent dizziness, most consistent with BPPV -tolerating 12.6 mg metoprolol. Unclear that decreasing the dose changed symptoms -given sensation of motion, occurrences when she turns her head, highly suggestive of BPPV -reviewed red flag warning signs that need immediate medical attention   History of CAD with prior MI and PCI in [place]:  -no angina -lexiscan without ischemia 06/2019 -continue aspirin 81 mg -on metoprolol and lisinopril. Unclear what her EF was at the time of her MI. EF on nuclear stress 57% -statin as below -counseled on red flag warning signs that need immediate medical attention  Mixed hyperlipidemia, likely familial given the highly elevated LDL -has been trying to tolerate statin but worsens her knee pain -has follow up with Dr. Debara Pickett 10/2022 -we discussed non-statin options. She would like to follow up with Dr. Debara Pickett, possibly recheck labs and will discuss options at that time   Cardiac risk counseling and prevention recommendations: Secondary prevention given known history, also with family  history of heart disease. -recommend heart healthy/Mediterranean diet, with whole grains, fruits, vegetable, fish, lean meats, nuts, and olive oil. Limit salt. -recommend moderate walking, 3-5 times/week for 30-50 minutes each session. Aim for at least 150 minutes.week. Goal should be pace of 3 miles/hours, or walking 1.5 miles in 30 minutes -recommend avoidance of tobacco products. Avoid excess alcohol.  Not discussed today: Recurrent DVT: -with two events, most recent unprovoked, would consider lifelong anticoagulation. However she elected to stop anticoagulation and has not had recurrent events.  Plan for follow up: 04/2023 or sooner as needed.  Buford Dresser, MD, PhD [place]  [facility]    Medication Adjustments/Labs and Tests Ordered: Current medicines are reviewed at length with the patient today.  Concerns regarding medicines are outlined above.   Orders Placed This Encounter  Procedures   EKG 12-Lead   No orders of the defined types were placed in this encounter.  Patient Instructions  Medication Instructions:  Your physician recommends that you continue on your current medications as directed. Please refer to the Current Medication list given to you today.  *If you need a refill on your cardiac medications before your next appointment, please call your pharmacy*  Follow-Up: At [facility], you and your health needs are our priority.  As part of our continuing mission to provide you with exceptional heart care, we have created designated Provider Care Teams.  These Care Teams include your primary Cardiologist (physician) and Advanced Practice Providers (APPs -  Physician Assistants and Nurse Practitioners) who all work together to provide you with the care you need, when you need it.  We recommend signing up for the patient portal called "MyChart".  Sign up information is provided on this After Visit Summary.  MyChart is used to connect with patients  for Virtual Visits (Telemedicine).  Patients are able to view lab/test results, encounter notes, upcoming appointments, etc.  Non-urgent messages can be sent to your provider as well.   To learn more about what you can do with MyChart, go to [email or web address].    Your next appointment:   November with Dr. Little Ishikawa Rivera,acting as a scribe for Buford Dresser, MD.,have documented all relevant documentation on the behalf of Buford Dresser, MD,as directed by  Buford Dresser, MD while in the presence of Buford Dresser, MD.  I, Buford Dresser, MD, have reviewed  all documentation for this visit. The documentation on 07/29/22 for the exam, diagnosis, procedures, and orders are all accurate and complete.   Signed, Buford Dresser, MD PhD 07/29/2022     [place]

## 2022-08-01 ENCOUNTER — Ambulatory Visit
Admission: EM | Admit: 2022-08-01 | Discharge: 2022-08-01 | Disposition: A | Discharge status: Home / Self Care | Payer: Commercial Managed Care - PPO | Attending: Urgent Care | Admitting: Urgent Care

## 2022-08-01 ENCOUNTER — Other Ambulatory Visit: Payer: Self-pay

## 2022-08-01 ENCOUNTER — Ambulatory Visit: Admit: 2022-08-01 | Discharge status: Absent without leave | Payer: Commercial Managed Care - PPO

## 2022-08-01 DIAGNOSIS — N898 Other specified noninflammatory disorders of vagina: Secondary | ICD-10-CM

## 2022-08-01 DIAGNOSIS — I252 Old myocardial infarction: Secondary | ICD-10-CM | POA: Diagnosis not present

## 2022-08-01 DIAGNOSIS — Z87891 Personal history of nicotine dependence: Secondary | ICD-10-CM | POA: Insufficient documentation

## 2022-08-01 DIAGNOSIS — Z86718 Personal history of other venous thrombosis and embolism: Secondary | ICD-10-CM | POA: Diagnosis not present

## 2022-08-01 DIAGNOSIS — R350 Frequency of micturition: Secondary | ICD-10-CM | POA: Diagnosis not present

## 2022-08-01 DIAGNOSIS — Z113 Encounter for screening for infections with a predominantly sexual mode of transmission: Secondary | ICD-10-CM

## 2022-08-01 DIAGNOSIS — R81 Glycosuria: Secondary | ICD-10-CM

## 2022-08-01 DIAGNOSIS — N760 Acute vaginitis: Secondary | ICD-10-CM

## 2022-08-01 DIAGNOSIS — I2510 Atherosclerotic heart disease of native coronary artery without angina pectoris: Secondary | ICD-10-CM | POA: Diagnosis not present

## 2022-08-01 LAB — POCT URINALYSIS DIP (MANUAL ENTRY)
Bilirubin, UA: NEGATIVE
Blood, UA: NEGATIVE
Glucose, UA: 500 mg/dL — AB
Ketones, POC UA: NEGATIVE mg/dL
Leukocytes, UA: NEGATIVE
Nitrite, UA: NEGATIVE
Protein Ur, POC: NEGATIVE mg/dL
Spec Grav, UA: 1.025 (ref 1.010–1.025)
Urobilinogen, UA: 0.2 E.U./dL
pH, UA: 5.5 (ref 5.0–8.0)

## 2022-08-01 LAB — POCT FASTING CBG KUC MANUAL ENTRY: POCT Glucose (KUC): 98 mg/dL (ref 70–99)

## 2022-08-01 MED ORDER — METRONIDAZOLE 500 MG PO TABS: 500.0000 mg | ORAL_TABLET | Freq: Two times a day (BID) | ORAL | 0 refills | Status: AC

## 2022-08-01 NOTE — ED Triage Notes (Signed)
Pt c/o vaginal discharge and urinary frequency x 1 week. Denies abd pain. Would like STD testing as well as UA.

## 2022-08-01 NOTE — Discharge Instructions (Addendum)
You were tested today for gonorrhea, chlamydia, trichomonas, BV, and yeast. You were also tested for syphillis and HIV. We will call you with the results of your test once received. Please start taking Flagyl twice daily for the next 7 days.  Avoid drinking any alcohol while taking this antibiotic.  Please avoid all forms of intercourse until test results received, and if positive for any STI, all partners will need to complete entire course of antibiotics prior to resuming. As always, practice safe sexual practices by using protection each and every time, and limiting number of partners.   We drew an A1c level on you today which will further assess your blood sugar.  Please follow-up with your primary care physician to have your urine rechecked within the next 1 to 2 weeks.
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
## 2022-08-03 LAB — CERVICOVAGINAL ANCILLARY ONLY
Bacterial Vaginitis (gardnerella): POSITIVE — AB
Candida Glabrata: NEGATIVE
Candida Vaginitis: NEGATIVE
Chlamydia: NEGATIVE
Comment: NEGATIVE
Comment: NEGATIVE
Comment: NEGATIVE
Comment: NEGATIVE
Comment: NEGATIVE
Comment: NORMAL
Neisseria Gonorrhea: NEGATIVE
Trichomonas: NEGATIVE

## 2022-08-04 ENCOUNTER — Ambulatory Visit (INDEPENDENT_AMBULATORY_CARE_PROVIDER_SITE_OTHER): Payer: Commercial Managed Care - PPO | Admitting: Primary Care

## 2022-08-04 LAB — HEMOGLOBIN A1C
Est. average glucose Bld gHb Est-mCnc: 134 mg/dL
Hgb A1c MFr Bld: 6.3 % — ABNORMAL HIGH (ref 4.8–5.6)

## 2022-08-04 LAB — RPR: RPR Ser Ql: NONREACTIVE

## 2022-08-04 LAB — HIV ANTIBODY (ROUTINE TESTING W REFLEX): HIV Screen 4th Generation wRfx: NONREACTIVE

## 2022-08-19 ENCOUNTER — Encounter (INDEPENDENT_AMBULATORY_CARE_PROVIDER_SITE_OTHER): Payer: Self-pay

## 2022-09-02 ENCOUNTER — Telehealth: Payer: Commercial Managed Care - PPO | Admitting: Nurse Practitioner

## 2022-09-02 DIAGNOSIS — K649 Unspecified hemorrhoids: Secondary | ICD-10-CM

## 2022-09-02 MED ORDER — HYDROCORTISONE (PERIANAL) 2.5 % EX CREA: 1.0000 | TOPICAL_CREAM | Freq: Two times a day (BID) | CUTANEOUS | 0 refills | Status: DC

## 2022-09-02 MED ORDER — DOCUSATE SODIUM 50 MG PO CAPS: 50.0000 mg | ORAL_CAPSULE | Freq: Two times a day (BID) | ORAL | 0 refills | Status: DC | PRN
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
## 2022-09-08 ENCOUNTER — Encounter (INDEPENDENT_AMBULATORY_CARE_PROVIDER_SITE_OTHER): Payer: Self-pay | Admitting: Primary Care

## 2022-09-08 ENCOUNTER — Ambulatory Visit (INDEPENDENT_AMBULATORY_CARE_PROVIDER_SITE_OTHER): Payer: Commercial Managed Care - PPO | Admitting: Primary Care

## 2022-09-08 VITALS — BP 129/87 | HR 88 | Resp 16 | Wt 224.4 lb

## 2022-09-08 DIAGNOSIS — K649 Unspecified hemorrhoids: Secondary | ICD-10-CM

## 2022-09-08 MED ORDER — HYDROCORTISONE (PERIANAL) 2.5 % EX CREA: 1.0000 | TOPICAL_CREAM | Freq: Two times a day (BID) | CUTANEOUS | 0 refills | Status: AC

## 2022-09-08 MED ORDER — SENNA-DOCUSATE SODIUM 8.6-50 MG PO TABS: 1.0000 | ORAL_TABLET | Freq: Every day | ORAL | 3 refills | Status: DC

## 2022-09-08 NOTE — Progress Notes (Signed)
Kristina Krause  Kristina Krause, is a 52 y.o. female  [id number]  [id number]  DOB - 10-May-1971  Chief Complaint  Patient presents with   Hemorrhoids       Subjective:   Kristina Krause is a 52 y.o. female here today for a acute visit. She had a virtual visit and was advised she had hemorrids needed a colonoscopy . Patient has No headache, No chest pain, No abdominal pain - No Nausea, No new weakness tingling or numbness, No Cough - shortness of breath  No problems updated.  Allergies  Allergen Reactions   Aspirin Nausea Only    Pt reports anything above 81 mg causes stomach aches     Ibuprofen Other (See Comments)    Cramping, if takes more than 800 mg    Past Medical History:  Diagnosis Date   Coronary artery disease    DVT (deep vein thrombosis) in pregnancy    Headache    Myocardial infarct     Current Outpatient Medications on File Prior to Visit  Medication Sig Dispense Refill   aspirin EC 81 MG tablet Take 81 mg by mouth daily. Swallow whole.     docusate sodium (COLACE) 50 MG capsule Take 1 capsule (50 mg total) by mouth 2 (two) times daily as needed for mild constipation. 30 capsule 0   hydrocortisone (ANUSOL-HC) 2.5 % rectal cream Place 1 Application rectally 2 (two) times daily. 30 g 0   lisinopril (ZESTRIL) 2.5 MG tablet TAKE 1 TABLET(2.5 MG) BY MOUTH DAILY 30 tablet 6   meclizine (ANTIVERT) 25 MG tablet Take 1 tablet (25 mg total) by mouth 3 (three) times daily as needed for dizziness. 30 tablet 0   methocarbamol (ROBAXIN) 500 MG tablet Take 1 tablet (500 mg total) by mouth 2 (two) times daily. 20 tablet 0   metoprolol succinate (TOPROL-XL) 25 MG 24 hr tablet Take 0.5 tablets (12.5 mg total) by mouth daily. 45 tablet 3   naproxen (NAPROSYN) 500 MG tablet Take 1 tablet (500 mg total) by mouth 2 (two) times daily. 30 tablet 0   rosuvastatin (CRESTOR) 5 MG tablet Take 1 tablet (5 mg total) by mouth daily. 90 tablet 3   trimethoprim-polymyxin b  (POLYTRIM) ophthalmic solution Place 1 drop into both eyes every 4 (four) hours.     No current facility-administered medications on file prior to visit.    Objective:   Vitals:   09/08/22 1511  BP: 129/87  Pulse: 88  Resp: 16  SpO2: 97%  Weight: 224 lb 6.4 oz (101.8 kg)    Comprehensive ROS Pertinent positive and negative noted in HPI   Exam General appearance : Awake, alert, not in any distress. Speech Clear. Not toxic looking HEENT: Atraumatic and Normocephalic, pupils equally reactive to light and accomodation Neck: Supple, no JVD. No cervical lymphadenopathy.  Chest: Good air entry bilaterally, no added sounds  CVS: S1 S2 regular, no murmurs.  Abdomen: Bowel sounds present, Non tender and not distended with no gaurding, rigidity or rebound. Extremities: B/L Lower Ext shows no edema, both legs are warm to touch Neurology: Awake alert, and oriented X 3, CN II-XII intact, Non focal Skin: No Rash  Data Review Lab Results  Component Value Date   HGBA1C 6.3 (H) 08/01/2022   HGBA1C 5.9 (A) 08/14/2021    Assessment & Plan  Kristina Krause was seen today for hemorrhoids.  Diagnoses and all orders for this visit:  Hemorrhoids, unspecified hemorrhoid type Internal 1 inch and several cc external hemorrhoid  Will refer to general surgery  -     hydrocortisone (ANUSOL-HC) 2.5 % rectal cream; Place 1 Application rectally 2 (two) times daily. -     sennosides-docusate sodium (SENOKOT-S) 8.6-50 MG tablet; Take 1 tablet by mouth daily.     Patient have been counseled extensively about nutrition and exercise. Other issues discussed during this visit include: low cholesterol diet, weight control and daily exercise, foot care, annual eye examinations at Ophthalmology, importance of adherence with medications and regular follow-up. We also discussed long term complications of uncontrolled diabetes and hypertension.   No follow-ups on file.  The patient was given clear instructions to go to  ER or return to medical center if symptoms don't improve, worsen or new problems develop. The patient verbalized understanding. The patient was told to call to get lab results if they haven't heard anything in the next week.   This note has been created with Surveyor, quantity. Any transcriptional errors are unintentional.   [name], NP 09/08/2022, 3:19 PM

## 2022-09-08 NOTE — Patient Instructions (Signed)
Hemorrhoids Hemorrhoids are swollen veins in and around the rectum or the opening of the butt (anus). There are two types of hemorrhoids: Internal. These occur in the veins just inside the rectum. They may poke through to the outside and become irritated and painful. External. These occur in the veins outside the anus. They can be felt as a painful swelling or hard lump near the anus. Most hemorrhoids do not cause severe problems. Often, they can be treated at home with diet and lifestyle changes. If home treatments do not help, you may need a procedure to shrink or remove the hemorrhoids. What are the causes? Hemorrhoids are caused by pressure near the anus. This pressure may be caused by: Constipation or diarrhea. Straining to poop. Pregnancy. Obesity. Sitting or riding a bike for a long time. Heavy lifting or other things that cause you to strain. Anal sex. What are the signs or symptoms? Symptoms of this condition include: Pain. Anal itching or irritation. Bleeding from the rectum. Leakage of poop (stool). Swelling of the anus. One or more lumps around the anus. How is this diagnosed? Hemorrhoids can often be diagnosed through a visual exam. Other exams or tests may also be done, such as: A digital rectal exam. This is when your health care provider feels inside your rectum with a gloved finger. Anoscope. This is an exam of the anus using a small tube. A blood test, if you have lost a lot of blood. A sigmoidoscopy or colonoscopy. These are tests to look inside the colon using a tube with a camera on the end. How is this treated? In most cases, hemorrhoids can be treated at home with diet and lifestyle changes. If these changes do not help, you may need to have a procedure done. These procedures can make the hemorrhoids smaller or fully remove them. Common procedures include: Rubber band ligation. Rubber bands are placed at the base of the hemorrhoids to cut off their blood  supply. Sclerotherapy. Medicine is put into the hemorrhoids to shrink them. Infrared coagulation. A type of light energy is used to get rid of the hemorrhoids. Hemorrhoidectomy surgery. The hemorrhoids are removed during surgery. Then, the veins that supply them are tied off. Stapled hemorrhoidopexy surgery. The base of the hemorrhoid is stapled to the wall of the rectum. Follow these instructions at home: Medicines Take over-the-counter and prescription medicines only as told by your provider. Use medicated creams or medicines that are put in the rectum (suppositories) as told by your provider. Eating and drinking  Eat foods that are high in fiber, such as beans, whole grains, and fresh fruits and vegetables. Ask your provider about taking products that have fiber added to them (fiber supplements). Reduce the amount of fat in your diet. You can do this by eating low-fat dairy products, eating less red meat, and avoiding processed foods. Drink enough fluid to keep your pee (urine) pale yellow. Managing pain and swelling  Take warm sitz baths for 20 minutes, 3-4 times a day. This can help ease pain and discomfort. You may do this in a bathtub or you can use a portable sitz bath that fits over the toilet. If told, put ice on the affected area. It may help to use ice packs between sitz baths. Put ice in a plastic bag. Place a towel between your skin and the bag. Leave the ice on for 20 minutes, 2-3 times a day. If your skin turns bright red, remove the ice right away to prevent   skin damage. The risk of damage is higher if you cannot feel pain, heat, or cold. General instructions Exercise. Ask your provider how much and what kind of exercise is best for you. In general, you should do moderate exercise for at least 30 minutes on most days of the week (150 minutes each week). You may want to try walking, biking, or yoga. Go to the bathroom when you have the urge to poop. Do not wait. Avoid  straining to poop. Keep the anus dry and clean. Use wet toilet paper or moist towelettes after you poop. Do not sit on the toilet for a long time. This can increase blood pooling and pain. Where to find more information National Institute of Diabetes and Digestive and Kidney Diseases: niddk.nih.gov Contact a health care provider if: You have more pain and swelling that do not get better with treatment. You have trouble pooping or you are not able to poop. You have pain or inflammation outside the area of the hemorrhoids. Get help right away if: You are bleeding from your rectum and you cannot get it to stop. This information is not intended to replace advice given to you by your health care provider. Make sure you discuss any questions you have with your health care provider. Document Revised: 02/04/2022 Document Reviewed: 02/04/2022 Elsevier Patient Education  2023 Elsevier Inc.  

## 2022-10-26 ENCOUNTER — Ambulatory Visit: Payer: Commercial Managed Care - PPO | Attending: Internal Medicine | Admitting: Internal Medicine

## 2022-10-26 ENCOUNTER — Encounter: Payer: Self-pay | Admitting: Internal Medicine

## 2022-10-26 ENCOUNTER — Telehealth: Payer: Self-pay | Admitting: *Deleted

## 2022-10-26 VITALS — BP 123/80 | HR 78 | Ht 64.0 in | Wt 205.0 lb

## 2022-10-26 DIAGNOSIS — M791 Myalgia, unspecified site: Secondary | ICD-10-CM

## 2022-10-26 DIAGNOSIS — I2510 Atherosclerotic heart disease of native coronary artery without angina pectoris: Secondary | ICD-10-CM

## 2022-10-26 DIAGNOSIS — T466X5D Adverse effect of antihyperlipidemic and antiarteriosclerotic drugs, subsequent encounter: Secondary | ICD-10-CM

## 2022-10-26 DIAGNOSIS — Z9861 Coronary angioplasty status: Secondary | ICD-10-CM

## 2022-10-26 DIAGNOSIS — E7849 Other hyperlipidemia: Secondary | ICD-10-CM | POA: Diagnosis not present

## 2022-10-26 NOTE — Progress Notes (Signed)
Virtual Visit via Video Note   Because of Kristina Krause co-morbid illnesses, she is at least at moderate risk for complications without adequate follow up.  This format is felt to be most appropriate for this patient at this time.  All issues noted in this document were discussed and addressed.  A limited physical exam was performed with this format.  Please refer to the patient's chart for her consent to telehealth for [facility].      Date:  10/26/2022   ID:  Kristina Krause, DOB July 29, 1970, MRN [id number] The patient was identified using 2 identifiers.  Evaluation Performed:  Follow-Up Visit  Patient Location:  [address] [place] [address]  Provider location:   [address], [address] [place], [place] [address]  PCP:  Kristina Sessions, NP  Cardiologist:  Kristina Red, MD Electrophysiologist:  None   Chief Complaint:  Manage dyslipidemia  History of Present Illness:    Kristina Krause is a 52 y.o. female who presents via audio/video conferencing for a telehealth visit today.  This is a pleasan female kindly referred by Dr. Cristal Krause for evaluation management of dyslipidemia.  She has a history of coronary artery disease with prior PCI in 2016 in [place].  She also has a history of recurrent DVT initially during pregnancy and more recently developed an age-indeterminate DVT after having asymmetric swelling.  She was on Eliquis for this but discontinued it although she should continue it for an indefinite period due to current guidelines, she said she had some side effects and discontinued the medicine.  She is only currently taking lisinopril and metoprolol.  She was previously on atorvastatin but had side effects with it and was concerned about the risk of developing diabetes.  She said both of her parents were on cholesterol medicines but did not develop heart disease until her 84s or 61s, both suffering MIs.  There is high cholesterol noted  in the family.  More recently her lipids and June showed total cholesterol 296, HDL 52, LDL 222 and triglycerides 621.  She says she was advised to take rosuvastatin but never was given a prescription or did not get it filled.  Subsequently she has been on no therapy.  She is interested in advanced testing including particle numbers and genetic testing for possible familial hyperlipidemia based on some of her own personal reading.  10/26/2022  Kristina Krause today for follow-up.  She had recently seen Dr. Cristal Krause.  She reported she could not tolerate the statin.  She is previously not tolerated atorvastatin and rosuvastatin.  Even at low doses she had myalgias.  She also said that she has been cutting back on her metoprolol because she had some dizziness associated with it.  Her last labs 6 months ago showed total cholesterol 298, triglycerides 112, HDL 58 and LDL 220.  She does have a history of coronary artery disease and prior PCI/stent in 2016.  Based on that her target LDL is less than 70.  We also did genetic testing on her.  This indicated 2 variants of unknown significance but likely suggest a genetic dyslipidemia.  This included a variation in LDL receptor and LPA gene.  I had previously ordered LP(a) blood testing as well as a lipid NMR but this was not performed.  Currently she is on no lipid-lowering therapies.  Prior CV studies:   The following studies were reviewed today:  Chart reviewed, labwork  PMHx:  Past Medical History:  Diagnosis  Date   Coronary artery disease    DVT (deep vein thrombosis) in pregnancy    Headache    Myocardial infarct [facility])     Past Surgical History:  Procedure Laterality Date   CORONARY STENT PLACEMENT  05/2015    FAMHx:  Family History  Problem Relation Age of Onset   Heart attack Mother    Diabetes Mother    Kidney failure Mother    Hypertension Mother    Heart attack Father     SOCHx:   reports that she has quit smoking. Her smoking use  included cigarettes. She has never used smokeless tobacco. She reports current alcohol use. She reports that she does not use drugs.  ALLERGIES:  Allergies  Allergen Reactions   Aspirin Nausea Only    Pt reports anything above 81 mg causes stomach aches     Ibuprofen Other (See Comments)    Cramping, if takes more than 800 mg   Rosuvastatin Other (See Comments)    myalgia    MEDS:  Current Meds  Medication Sig   aspirin EC 81 MG tablet Take 81 mg by mouth daily. Swallow whole.   hydrocortisone (ANUSOL-HC) 2.5 % rectal cream Place 1 Application rectally 2 (two) times daily.   lisinopril (ZESTRIL) 2.5 MG tablet TAKE 1 TABLET(2.5 MG) BY MOUTH DAILY   metoprolol succinate (TOPROL-XL) 25 MG 24 hr tablet Take 0.5 tablets (12.5 mg total) by mouth daily.   [DISCONTINUED] meclizine (ANTIVERT) 25 MG tablet Take 1 tablet (25 mg total) by mouth 3 (three) times daily as needed for dizziness.     ROS: Pertinent items noted in HPI and remainder of comprehensive ROS otherwise negative.  Labs/Other Tests and Data Reviewed:    Recent Labs: 04/04/2022: BUN 8; Creatinine, Ser 0.79; Hemoglobin 12.2; Platelets 345; Potassium 4.1; Sodium 137   Recent Lipid Panel Lab Results  Component Value Date/Time   CHOL 298 (H) 04/08/2022 10:09 AM   TRIG 112 04/08/2022 10:09 AM   HDL 58 04/08/2022 10:09 AM   CHOLHDL 5.1 (H) 04/08/2022 10:09 AM   LDLCALC 220 (H) 04/08/2022 10:09 AM    Wt Readings from Last 3 Encounters:  10/26/22 205 lb (93 kg)  09/08/22 224 lb 6.4 oz (101.8 kg)  07/29/22 223 lb 4.8 oz (101.3 kg)     Exam:    Vital Signs:  BP 123/80   Pulse 78   Ht 5\' 4"  (1.626 m)   Wt 205 lb (93 kg)   BMI 35.19 kg/m    General appearance: alert and no distress Lungs: No wheezing Abdomen: Obese Extremities: No edema Neurologic: Grossly normal  ASSESSMENT & PLAN:    Familial hyperlipidemia, LDL greater than 190, 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  This likely predicts a high LP(a) and is contributing to her high LDL cholesterol.  She was unfortunately intolerant of both atorvastatin and rosuvastatin even at low doses.  She would benefit from a PCSK9 inhibitor given her history of coronary artery disease with prior stent as she is at high risk of heart disease.  Will pursue prior authorization for PCSK9 inhibitor.  Will update her lipid NMR and blood test for LP(a) which is predicted to be elevated given her LPA gene variation.  Plan follow-up with me otherwise  in 4 months at which time we will repeat lipids on therapy.  Patient Risk:   After full review of this patients clinical status, I feel that they are at least moderate risk at this time.  Time:   Today, I have spent 15 minutes with the patient with telehealth technology discussing lipidemia.     Medication Adjustments/Labs and Tests Ordered: Current medicines are reviewed at length with the patient today.  Concerns regarding medicines are outlined above.   Tests Ordered: Orders Placed This Encounter  Procedures   Lipoprotein A (LPA)   NMR, lipoprofile    Medication Changes: No orders of the defined types were placed in this encounter.   Disposition:  in 4 month(s)  Chrystie Nose, MD, [facility], FACP  [place]  [facility]  Medical Director of the Advanced Lipid Disorders &  Cardiovascular Risk Reduction Clinic Diplomate of the American Board of Clinical Lipidology Attending Cardiologist  Direct Dial: [phone]  Fax: [phone]  Website:  [email or web address]  Chrystie Nose, MD  10/26/2022 8:55 AM

## 2022-10-26 NOTE — Patient Instructions (Signed)
Medication Instructions:  Your physician recommends that you continue on your current medications as directed. Please refer to the Current Medication list given to you today.  *If you need a refill on your cardiac medications before your next appointment, please call your pharmacy*  Lab Work: Please return for FASTING labs this week (NMR+LPa)  Our in office lab hours are Monday-Friday 8:00-4:00, closed for lunch 1-2 pm.  No appointment needed.  [facility] locations:   [facility] - [address]  - [address] ([facility]) - [address] [phone] [address]. [address]   [place] - [address]    [place]  - [address]    [place] - [address] - [address] [employer]  - [address] [place] - [address] ([facility])  Follow-Up: At [facility], you and your health needs are our priority.  As part of our continuing mission to provide you with exceptional heart care, we have created designated Provider Care Teams.  These Care Teams include your primary Cardiologist (physician) and Advanced Practice Providers (APPs -  Physician Assistants and Nurse Practitioners) who all work together to provide you with the care you need, when you need it.  We recommend signing up for the patient portal called "MyChart".  Sign up information is provided on this After Visit Summary.  MyChart is used to connect with patients for Virtual Visits (Telemedicine).  Patients are able to view lab/test results, encounter notes, upcoming appointments, etc.  Non-urgent messages can be sent to your provider as well.   To learn more about what you can do with MyChart, go to [email or web address].    Your next appointment:   4 month(s)  Provider:   [facility]

## 2022-10-26 NOTE — Telephone Encounter (Signed)
  Patient Consent for Virtual Visit        Kristina Krause has provided verbal consent on 10/26/2022 for a virtual visit (video or telephone).   CONSENT FOR VIRTUAL VISIT FOR:  Kristina Krause  By participating in this virtual visit I agree to the following:  I hereby voluntarily request, consent and authorize [facility] and its employed or contracted physicians, physician assistants, nurse practitioners or other licensed health care professionals (the Practitioner), to provide me with telemedicine health care services (the "Services") as deemed necessary by the treating Practitioner. I acknowledge and consent to receive the Services by the Practitioner via telemedicine. I understand that the telemedicine visit will involve communicating with the Practitioner through live audiovisual communication technology and the disclosure of certain medical information by electronic transmission. I acknowledge that I have been given the opportunity to request an in-person assessment or other available alternative prior to the telemedicine visit and am voluntarily participating in the telemedicine visit.  I understand that I have the right to withhold or withdraw my consent to the use of telemedicine in the course of my care at any time, without affecting my right to future care or treatment, and that the Practitioner or I may terminate the telemedicine visit at any time. I understand that I have the right to inspect all information obtained and/or recorded in the course of the telemedicine visit and may receive copies of available information for a reasonable fee.  I understand that some of the potential risks of receiving the Services via telemedicine include:  Delay or interruption in medical evaluation due to technological equipment failure or disruption; Information transmitted may not be sufficient (e.g. poor resolution of images) to allow for appropriate medical decision making by the Practitioner;  and/or  In rare instances, security protocols could fail, causing a breach of personal health information.  Furthermore, I acknowledge that it is my responsibility to provide information about my medical history, conditions and care that is complete and accurate to the best of my ability. I acknowledge that Practitioner's advice, recommendations, and/or decision may be based on factors not within their control, such as incomplete or inaccurate data provided by me or distortions of diagnostic images or specimens that may result from electronic transmissions. I understand that the practice of medicine is not an exact science and that Practitioner makes no warranties or guarantees regarding treatment outcomes. I acknowledge that a copy of this consent can be made available to me via my patient portal [facility] MyChart), or I can request a printed copy by calling the office of  HeartCare.    I understand that my insurance will be billed for this visit.   I have read or had this consent read to me. I understand the contents of this consent, which adequately explains the benefits and risks of the Services being provided via telemedicine.  I have been provided ample opportunity to ask questions regarding this consent and the Services and have had my questions answered to my satisfaction. I give my informed consent for the services to be provided through the use of telemedicine in my medical care

## 2022-11-06 ENCOUNTER — Encounter (INDEPENDENT_AMBULATORY_CARE_PROVIDER_SITE_OTHER): Payer: Self-pay | Admitting: Primary Care

## 2022-11-14 ENCOUNTER — Telehealth: Payer: Commercial Managed Care - PPO | Admitting: Nurse Practitioner

## 2022-11-14 NOTE — Progress Notes (Signed)
Patient no showed for appointment

## 2022-11-16 ENCOUNTER — Other Ambulatory Visit (HOSPITAL_COMMUNITY)
Admission: RE | Admit: 2022-11-16 | Discharge: 2022-11-16 | Disposition: A | Discharge status: Home / Self Care | Payer: Commercial Managed Care - PPO | Source: Ambulatory Visit | Attending: Primary Care | Admitting: Primary Care

## 2022-11-16 ENCOUNTER — Telehealth (INDEPENDENT_AMBULATORY_CARE_PROVIDER_SITE_OTHER): Payer: Self-pay | Admitting: Primary Care

## 2022-11-16 ENCOUNTER — Ambulatory Visit (INDEPENDENT_AMBULATORY_CARE_PROVIDER_SITE_OTHER): Payer: Commercial Managed Care - PPO

## 2022-11-16 DIAGNOSIS — N898 Other specified noninflammatory disorders of vagina: Secondary | ICD-10-CM | POA: Diagnosis present

## 2022-11-16 NOTE — Telephone Encounter (Signed)
Pt is calling in because she had a nurse visit today and per pt, Marcelino Duster was supposed to send over some medication for pt to the [facility] on [address]. Pt says there isn't any medication at the pharmacy and wants to know the status of this.

## 2022-11-16 NOTE — Telephone Encounter (Signed)
Will forward to provider  

## 2022-11-17 ENCOUNTER — Ambulatory Visit (INDEPENDENT_AMBULATORY_CARE_PROVIDER_SITE_OTHER): Payer: Self-pay

## 2022-11-17 ENCOUNTER — Telehealth: Payer: Commercial Managed Care - PPO | Admitting: Nurse Practitioner

## 2022-11-17 DIAGNOSIS — N760 Acute vaginitis: Secondary | ICD-10-CM | POA: Diagnosis not present

## 2022-11-17 DIAGNOSIS — B9689 Other specified bacterial agents as the cause of diseases classified elsewhere: Secondary | ICD-10-CM | POA: Diagnosis not present

## 2022-11-17 MED ORDER — METRONIDAZOLE 0.75 % EX GEL: CUTANEOUS | 0 refills | Status: DC

## 2022-11-17 MED ORDER — METRONIDAZOLE 500 MG PO TABS: 500.0000 mg | ORAL_TABLET | Freq: Two times a day (BID) | ORAL | 0 refills | Status: DC
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
## 2022-11-17 NOTE — Telephone Encounter (Signed)
Called and spoke with patient and sent patient some instructions in mychart till vaginal swab comes back

## 2022-11-17 NOTE — Patient Instructions (Signed)
  [name], thank you for joining Claiborne Rigg, NP for today's virtual visit.  While this provider is not your primary care provider (PCP), if your PCP is located in our provider database this encounter information will be shared with them immediately following your visit.   A [facility] MyChart account gives you access to today's visit and all your visits, tests, and labs performed at [facility] " click here if you don't have a [facility] MyChart account or go to [email or web address]  Consent: (Patient) Kristina Krause provided verbal consent for this virtual visit at the beginning of the encounter.  Current Medications:  Current Outpatient Medications:    metroNIDAZOLE (METROGEL) 0.75 % gel, One applicatorful once daily at bedtime for 5 days, Disp: 45 g, Rfl: 0   aspirin EC 81 MG tablet, Take 81 mg by mouth daily. Swallow whole., Disp: , Rfl:    hydrocortisone (ANUSOL-HC) 2.5 % rectal cream, Place 1 Application rectally 2 (two) times daily., Disp: 30 g, Rfl: 0   lisinopril (ZESTRIL) 2.5 MG tablet, TAKE 1 TABLET(2.5 MG) BY MOUTH DAILY, Disp: 30 tablet, Rfl: 6   metoprolol succinate (TOPROL-XL) 25 MG 24 hr tablet, Take 0.5 tablets (12.5 mg total) by mouth daily., Disp: 45 tablet, Rfl: 3   rosuvastatin (CRESTOR) 5 MG tablet, Take 1 tablet (5 mg total) by mouth daily., Disp: 90 tablet, Rfl: 3   Medications ordered in this encounter:  Meds ordered this encounter  Medications   metroNIDAZOLE (METROGEL) 0.75 % gel    Sig: One applicatorful once daily at bedtime for 5 days    Dispense:  45 g    Refill:  0    Order Specific Question:   Supervising Provider    Answer:   Merrilee Jansky [id number]     *If you need refills on other medications prior to your next appointment, please contact your pharmacy*  Follow-Up: Call back or seek an in-person evaluation if the symptoms worsen or if the condition fails to improve as anticipated.  [facility] [phone]  Other Instructions   If you have been instructed to have an in-person evaluation today at a local Urgent Care facility, please use the link below. It will take you to a list of all of our available [facility], including address, phone number and hours of operation. Please do not delay care.  [facility]  If you or a family member do not have a primary care provider, use the link below to schedule a visit and establish care. When you choose a [facility] primary care physician or advanced practice provider, you gain a long-term partner in health. Find a Primary Care Provider  Learn more about [facility]'s in-office and virtual care options: [facility] - Get Care Now

## 2022-11-17 NOTE — Telephone Encounter (Signed)
Will forward to provider  

## 2022-11-17 NOTE — Telephone Encounter (Signed)
    Chief Complaint: Vaginal itching, back pressure, urinary frequency. Upset lab results are not back."I need some medication." Symptoms: Above Frequency: Last Thursday Pertinent Negatives: Patient denies vaginal discharge Disposition: [] ED /[] Urgent Care (no appt availability in office) / [] Appointment(In office/virtual)/ []  [facility]/ [] Home Care/ [] Refused Recommended Disposition /[] [facility]/ [x]  Follow-up with PCP Additional Notes: Please advise pt.  Answer Assessment - Initial Assessment Questions 1. SYMPTOM: "What's the main symptom you're concerned about?" (e.g., pain, itching, dryness)     Itching, back pain, urinary frequency 2. LOCATION: "Where is the   located?" (e.g., inside/outside, left/right)     External 3. ONSET: "When did the    start?"     Last Thursday 4. PAIN: "Is there any pain?" If Yes, ask: "How bad is it?" (Scale: 1-10; mild, moderate, severe)   -  MILD (1-3): Doesn't interfere with normal activities.    -  MODERATE (4-7): Interferes with normal activities (e.g., work or school) or awakens from sleep.     -  SEVERE (8-10): Excruciating pain, unable to do any normal activities.     Mild 5. ITCHING: "Is there any itching?" If Yes, ask: "How bad is it?" (Scale: 1-10; mild, moderate, severe)     Moderate 6. CAUSE: "What do you think is causing the discharge?" "Have you had the same problem before? What happened then?"     Unsure 7. OTHER SYMPTOMS: "Do you have any other symptoms?" (e.g., fever, itching, vaginal bleeding, pain with urination, injury to genital area, vaginal foreign body)     Back pressure 8. PREGNANCY: "Is there any chance you are pregnant?" "When was your last menstrual period?"     no  Protocols used: Vaginal Symptoms-A-AH

## 2022-11-18 ENCOUNTER — Other Ambulatory Visit: Payer: Self-pay | Admitting: Nurse Practitioner

## 2022-11-18 ENCOUNTER — Other Ambulatory Visit (INDEPENDENT_AMBULATORY_CARE_PROVIDER_SITE_OTHER): Payer: Self-pay | Admitting: Primary Care

## 2022-11-18 DIAGNOSIS — B9689 Other specified bacterial agents as the cause of diseases classified elsewhere: Secondary | ICD-10-CM

## 2022-11-18 DIAGNOSIS — B379 Candidiasis, unspecified: Secondary | ICD-10-CM

## 2022-11-18 LAB — CERVICOVAGINAL ANCILLARY ONLY
Bacterial Vaginitis (gardnerella): NEGATIVE
Candida Glabrata: NEGATIVE
Candida Vaginitis: POSITIVE — AB
Chlamydia: NEGATIVE
Comment: NEGATIVE
Comment: NEGATIVE
Comment: NEGATIVE
Comment: NEGATIVE
Comment: NEGATIVE
Comment: NORMAL
Neisseria Gonorrhea: NEGATIVE
Trichomonas: NEGATIVE

## 2022-11-18 MED ORDER — FLUCONAZOLE 150 MG PO TABS: 150.0000 mg | ORAL_TABLET | Freq: Every day | ORAL | 1 refills | Status: AC

## 2022-11-18 MED ORDER — METRONIDAZOLE 500 MG PO TABS: 500.0000 mg | ORAL_TABLET | Freq: Two times a day (BID) | ORAL | 0 refills | Status: DC

## 2022-11-18 NOTE — Progress Notes (Signed)
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    Dispense:  14 tablet    Refill:  0

## 2022-11-18 NOTE — Progress Notes (Signed)
Metronidazole dc'd. WET PREP Negative for BV

## 2022-11-30 ENCOUNTER — Other Ambulatory Visit (HOSPITAL_COMMUNITY)
Admission: RE | Admit: 2022-11-30 | Discharge: 2022-11-30 | Disposition: A | Discharge status: Home / Self Care | Payer: Commercial Managed Care - PPO | Source: Ambulatory Visit | Attending: Primary Care | Admitting: Primary Care

## 2022-11-30 ENCOUNTER — Ambulatory Visit (INDEPENDENT_AMBULATORY_CARE_PROVIDER_SITE_OTHER): Payer: Commercial Managed Care - PPO

## 2022-11-30 ENCOUNTER — Telehealth: Payer: Self-pay | Admitting: Primary Care

## 2022-11-30 DIAGNOSIS — R300 Dysuria: Secondary | ICD-10-CM

## 2022-11-30 DIAGNOSIS — N898 Other specified noninflammatory disorders of vagina: Secondary | ICD-10-CM | POA: Insufficient documentation

## 2022-11-30 NOTE — Telephone Encounter (Signed)
Copied from CRM [id number]. Topic: Appointment Scheduling - Scheduling Inquiry for Clinic >> Nov 30, 2022 10:37 AM Pincus Sanes wrote: Reason for CRM: Pt seen 6/6 UTI now thinks has moved to kidneys hurting in back general unwell. FU [phone] spoke to office fu

## 2022-11-30 NOTE — Telephone Encounter (Signed)
Pt is scheduled for a nurse visit for a ua and self swab

## 2022-12-01 ENCOUNTER — Emergency Department (HOSPITAL_COMMUNITY)
Admission: EM | Admit: 2022-12-01 | Discharge: 2022-12-01 | Disposition: A | Discharge status: Home / Self Care | Payer: Commercial Managed Care - PPO | Attending: Emergency Medicine | Admitting: Emergency Medicine

## 2022-12-01 ENCOUNTER — Other Ambulatory Visit: Payer: Self-pay

## 2022-12-01 ENCOUNTER — Emergency Department (HOSPITAL_COMMUNITY): Payer: Commercial Managed Care - PPO

## 2022-12-01 ENCOUNTER — Encounter (HOSPITAL_COMMUNITY): Payer: Self-pay | Admitting: *Deleted

## 2022-12-01 DIAGNOSIS — Z7982 Long term (current) use of aspirin: Secondary | ICD-10-CM | POA: Insufficient documentation

## 2022-12-01 DIAGNOSIS — R109 Unspecified abdominal pain: Secondary | ICD-10-CM | POA: Diagnosis not present

## 2022-12-01 DIAGNOSIS — M549 Dorsalgia, unspecified: Secondary | ICD-10-CM | POA: Diagnosis present

## 2022-12-01 DIAGNOSIS — I2510 Atherosclerotic heart disease of native coronary artery without angina pectoris: Secondary | ICD-10-CM | POA: Diagnosis not present

## 2022-12-01 LAB — CBC WITH DIFFERENTIAL/PLATELET
Abs Immature Granulocytes: 0.01 10*3/uL (ref 0.00–0.07)
Basophils Absolute: 0 10*3/uL (ref 0.0–0.1)
Basophils Relative: 1 %
Eosinophils Absolute: 0.1 10*3/uL (ref 0.0–0.5)
Eosinophils Relative: 1 %
HCT: 38.6 % (ref 36.0–46.0)
Hemoglobin: 12.7 g/dL (ref 12.0–15.0)
Immature Granulocytes: 0 %
Lymphocytes Relative: 46 %
Lymphs Abs: 2 10*3/uL (ref 0.7–4.0)
MCH: 30.4 pg (ref 26.0–34.0)
MCHC: 32.9 g/dL (ref 30.0–36.0)
MCV: 92.3 fL (ref 80.0–100.0)
Monocytes Absolute: 0.4 10*3/uL (ref 0.1–1.0)
Monocytes Relative: 9 %
Neutro Abs: 2 10*3/uL (ref 1.7–7.7)
Neutrophils Relative %: 43 %
Platelets: 343 10*3/uL (ref 150–400)
RBC: 4.18 MIL/uL (ref 3.87–5.11)
RDW: 12.9 % (ref 11.5–15.5)
WBC: 4.5 10*3/uL (ref 4.0–10.5)
nRBC: 0 % (ref 0.0–0.2)

## 2022-12-01 LAB — URINALYSIS, W/ REFLEX TO CULTURE (INFECTION SUSPECTED)
Bilirubin Urine: NEGATIVE
Glucose, UA: 50 mg/dL — AB
Hgb urine dipstick: NEGATIVE
Ketones, ur: NEGATIVE mg/dL
Leukocytes,Ua: NEGATIVE
Nitrite: NEGATIVE
Protein, ur: NEGATIVE mg/dL
Specific Gravity, Urine: 1.017 (ref 1.005–1.030)
pH: 5 (ref 5.0–8.0)

## 2022-12-01 LAB — COMPREHENSIVE METABOLIC PANEL
ALT: 45 U/L — ABNORMAL HIGH (ref 0–44)
AST: 37 U/L (ref 15–41)
Albumin: 3.6 g/dL (ref 3.5–5.0)
Alkaline Phosphatase: 74 U/L (ref 38–126)
Anion gap: 8 (ref 5–15)
BUN: 10 mg/dL (ref 6–20)
CO2: 24 mmol/L (ref 22–32)
Calcium: 8.8 mg/dL — ABNORMAL LOW (ref 8.9–10.3)
Chloride: 105 mmol/L (ref 98–111)
Creatinine, Ser: 0.82 mg/dL (ref 0.44–1.00)
GFR, Estimated: >60 mL/min (ref >60)
Glucose, Bld: 101 mg/dL — ABNORMAL HIGH (ref 70–99)
Potassium: 4.1 mmol/L (ref 3.5–5.1)
Sodium: 137 mmol/L (ref 135–145)
Total Bilirubin: 1.1 mg/dL (ref 0.3–1.2)
Total Protein: 7.1 g/dL (ref 6.5–8.1)

## 2022-12-01 LAB — POCT URINALYSIS DIP (CLINITEK)
Bilirubin, UA: NEGATIVE
Blood, UA: NEGATIVE
Glucose, UA: 100 mg/dL — AB
Ketones, POC UA: NEGATIVE mg/dL
Leukocytes, UA: NEGATIVE
Nitrite, UA: NEGATIVE
POC PROTEIN,UA: NEGATIVE
Spec Grav, UA: 1.025 (ref 1.010–1.025)
Urobilinogen, UA: 0.2 E.U./dL
pH, UA: 5.5 (ref 5.0–8.0)

## 2022-12-01 MED ORDER — ACETAMINOPHEN 500 MG PO TABS
1000.0000 mg | ORAL_TABLET | Freq: Once | ORAL | Status: AC
  Administered 2022-12-01: 1000 mg via ORAL
  Filled 2022-12-01: qty 2

## 2022-12-01 MED ORDER — LIDOCAINE 5 % EX PTCH
1.0000 | MEDICATED_PATCH | CUTANEOUS | Status: DC
  Administered 2022-12-01: 1 via TRANSDERMAL
  Filled 2022-12-01: qty 1

## 2022-12-01 MED ORDER — METHOCARBAMOL 500 MG PO TABS: 500.0000 mg | ORAL_TABLET | Freq: Two times a day (BID) | ORAL | 0 refills | Status: AC

## 2022-12-01 MED ORDER — METHOCARBAMOL 500 MG PO TABS
500.0000 mg | ORAL_TABLET | Freq: Once | ORAL | Status: DC
  Filled 2022-12-01: qty 1

## 2022-12-01 NOTE — Discharge Instructions (Addendum)
Thank you for coming to [facility] Emergency Department. You were seen for pain in your right flank. We did an exam, labs, and imaging, and these showed no acute findings. Given that your pain is exclusively with movement it is possible you are having musculoskeletal back pain.  You can take robaxin 500 mg twice per day for muscle relaxer. You can take tylenol 1 g three times per day as well. Over the counter lidocaine patches and ice/heat can also help your pain.  Please follow up with your primary care provider within 1 week.   Do not hesitate to return to the ED or call 911 if you experience: -Worsening symptoms -Vaginal discharge, pain, bleeding -Pain with urination, difficulty or inability to urinate -Chest pain, shortness of breath -Lightheadedness, passing out -Fevers/chills -Anything else that concerns you

## 2022-12-01 NOTE — ED Provider Notes (Signed)
[place] [facility]ERGENCY DEPARTMENT AT [facility] Provider Note   CSN: [id number] Arrival date & time: 12/01/22  2956     History {Add pertinent medical, surgical, social history, OB history to HPI:1} Chief Complaint  Patient presents with   Back Pain    Kristina Krause is a 52 y.o. female with CAD s/p PCI presents with right sided back pain that started 3-4 days ago. Constant, severe, worse w/ movement. No h/o similar. No urinary or vaginal symptoms, no abdominal pain. Was recently treated for yeast infection/BV, but no vaginal symptoms anymore. Saw PCP yesterday and had urine but doesn't know results of that. Couldn't sleep last night d/t pain. Denies trauma, h/o IVDU, h/o malignancy, f/c. No n/v/d.   Back Pain      Home Medications Prior to Admission medications   Medication Sig Start Date End Date Taking? Authorizing Provider  fluconazole (DIFLUCAN) 150 MG tablet Take 1 tablet (150 mg total) by mouth daily. 11/18/22   Grayce Sessions, NP  aspirin EC 81 MG tablet Take 81 mg by mouth daily. Swallow whole.    [provider]  hydrocortisone (ANUSOL-HC) 2.5 % rectal cream Place 1 Application rectally 2 (two) times daily. 09/08/22   Grayce Sessions, NP  lisinopril (ZESTRIL) 2.5 MG tablet TAKE 1 TABLET(2.5 MG) BY MOUTH DAILY 06/25/21   Jodelle Red, MD  metoprolol succinate (TOPROL-XL) 25 MG 24 hr tablet Take 0.5 tablets (12.5 mg total) by mouth daily. 06/25/22   Jodelle Red, MD  rosuvastatin (CRESTOR) 5 MG tablet Take 1 tablet (5 mg total) by mouth daily. 01/01/21 07/29/22  Chrystie Nose, MD      Allergies    Aspirin, Ibuprofen, and Rosuvastatin    Review of Systems   Review of Systems  Musculoskeletal:  Positive for back pain.   A 10 point review of systems was performed and is negative unless otherwise reported in HPI.  Physical Exam Updated Vital Signs BP (!) 144/78 (BP Location: Left Arm)   Pulse 64   Temp 97.7 F (36.5 C)  (Oral)   Resp 18   Ht 5\' 4"  (1.626 m)   Wt 97.5 kg   SpO2 100%   BMI 36.90 kg/m  Physical Exam General: Normal appearing female, lying in bed.  HEENT: Sclera anicteric, MMM, trachea midline.  Cardiology: RRR, no murmurs/rubs/gallops. BL radial and DP pulses equal bilaterally.  Resp: Normal respiratory rate and effort. CTAB, no wheezes, rhonchi, crackles.  Abd: Soft, non-tender, non-distended. No rebound tenderness or guarding.  GU: Deferred. MSK: No peripheral edema or signs of trauma. Extremities without deformity or TTP. No cyanosis or clubbing. Skin: warm, dry.  Back: No CVA tenderness Neuro: A&Ox4, CNs II-XII grossly intact. MAEs. Sensation grossly intact.  Psych: Normal mood and affect.   ED Results / Procedures / Treatments   Labs (all labs ordered are listed, but only abnormal results are displayed) Labs Reviewed  CBC WITH DIFFERENTIAL/PLATELET  COMPREHENSIVE METABOLIC PANEL  URINALYSIS, W/ REFLEX TO CULTURE (INFECTION SUSPECTED)    EKG None  Radiology No results found.  Procedures Procedures  {Document cardiac monitor, telemetry assessment procedure when appropriate:1}  Medications Ordered in ED Medications - No data to display  ED Course/ Medical Decision Making/ A&P                          Medical Decision Making Amount and/or Complexity of Data Reviewed Labs: ordered. Decision-making details documented in ED Course. Radiology: ordered. Decision-making  details documented in ED Course.  Risk OTC drugs. Prescription drug management.    This patient presents to the ED for concern of ***, this involves an extensive number of treatment options, and is a complaint that carries with it a high risk of complications and morbidity.  I considered the following differential and admission for this acute, potentially life threatening condition.   MDM:    DDX for flank pain includes but is not limited to:  Vascular causes such as abdominal aortic aneurysm or  dissection, renal artery embolism, renal vein thrombosis, or mesenteric ischemia. ***  GU causes such as pyelonephritis, nephrolithiasis/ureterolithiasis, perinephric abscess, RCC, obstructive uropathy, renal infarction, ureteral blood clot or stricture, cystitis  GI causes such as biliary colic, pancreatitis, perforated peptic ulcer, appendicitis (appendix may be pushed to RUQ in pregnancy), inguinal Hernia, diverticulitis, malignancy, SBO.  Pt was recently treated for yeast/BV, but has no more vaginal symptoms. She has no pelvic or abdominal pain, no abd TTP. Lower c/f gynecologic causes such as ectopic pregnancy, PID/TOA, ovarian cyst vs torsion. Patient is worried she has a kidney stone and states she would like to have a CT to rule it out.***  Clinical Course as of 12/01/22 1408  Tue Dec 01, 2022  1205 CT Renal Stone Study No bowel obstruction, free air or free fluid.  Normal appendix.  Coronary artery calcifications are seen. Please correlate for other coronary risk factors.  Enlarged lobular uterus consistent with multiple fibroids.  IVC filter.   [HN]  1205 CBC with Differential wnl [HN]  1206 Comprehensive metabolic panel(!) Unremarkable in the context of this patient's presentation. No RUQ TTP or pain to indicate biliary colic.  [HN]    Clinical Course User Index [HN] Loetta Rough, MD    Labs: I Ordered, and personally interpreted labs.  The pertinent results include:  ***  Imaging Studies ordered: I ordered imaging studies including *** I independently visualized and interpreted imaging. I agree with the radiologist interpretation  Additional history obtained from ***.  External records from outside source obtained and reviewed including ***  Cardiac Monitoring: The patient was maintained on a cardiac monitor.  I personally viewed and interpreted the cardiac monitored which showed an underlying rhythm of: ***  Reevaluation: After the interventions noted  above, I reevaluated the patient and found that they have :{resolved/improved/worsened:23923::"improved"}  Social Determinants of Health: ***  Disposition:  ***  Co morbidities that complicate the patient evaluation  Past Medical History:  Diagnosis Date   Coronary artery disease    DVT (deep vein thrombosis) in pregnancy    Headache    Myocardial infarct (HCC)      Medicines No orders of the defined types were placed in this encounter.   I have reviewed the patients home medicines and have made adjustments as needed  Problem List / ED Course: Problem List Items Addressed This Visit   None        {Document critical care time when appropriate:1} {Document review of labs and clinical decision tools ie heart score, Chads2Vasc2 etc:1}  {Document your independent review of radiology images, and any outside records:1} {Document your discussion with family members, caretakers, and with consultants:1} {Document social determinants of health affecting pt's care:1} {Document your decision making why or why not admission, treatments were needed:1}  This note was created using dictation software, which may contain spelling or grammatical errors.

## 2022-12-01 NOTE — ED Triage Notes (Signed)
Pt started having rt mid back pain yesterday, went to PCP, labs obtained but will not be back for 3-4 days. Pain worse this morning.

## 2022-12-04 LAB — CERVICOVAGINAL ANCILLARY ONLY
Bacterial Vaginitis (gardnerella): NEGATIVE
Candida Glabrata: NEGATIVE
Candida Vaginitis: NEGATIVE
Chlamydia: NEGATIVE
Comment: NEGATIVE
Comment: NEGATIVE
Comment: NEGATIVE
Comment: NEGATIVE
Comment: NEGATIVE
Comment: NORMAL
Neisseria Gonorrhea: NEGATIVE
Trichomonas: NEGATIVE

## 2022-12-07 ENCOUNTER — Encounter (HOSPITAL_BASED_OUTPATIENT_CLINIC_OR_DEPARTMENT_OTHER): Payer: Self-pay

## 2022-12-07 NOTE — Telephone Encounter (Signed)
Please advise 

## 2022-12-09 ENCOUNTER — Ambulatory Visit (INDEPENDENT_AMBULATORY_CARE_PROVIDER_SITE_OTHER): Payer: Commercial Managed Care - PPO | Admitting: Primary Care

## 2022-12-09 NOTE — Telephone Encounter (Signed)
Patient has follow up questions.

## 2022-12-23 ENCOUNTER — Ambulatory Visit (INDEPENDENT_AMBULATORY_CARE_PROVIDER_SITE_OTHER): Payer: Commercial Managed Care - PPO | Admitting: Primary Care

## 2022-12-23 ENCOUNTER — Encounter (INDEPENDENT_AMBULATORY_CARE_PROVIDER_SITE_OTHER): Payer: Self-pay

## 2022-12-30 ENCOUNTER — Ambulatory Visit (INDEPENDENT_AMBULATORY_CARE_PROVIDER_SITE_OTHER): Payer: Commercial Managed Care - PPO | Admitting: Physician Assistant

## 2022-12-30 ENCOUNTER — Encounter (INDEPENDENT_AMBULATORY_CARE_PROVIDER_SITE_OTHER): Payer: Self-pay

## 2022-12-30 ENCOUNTER — Other Ambulatory Visit: Payer: Self-pay | Admitting: Physician Assistant

## 2022-12-30 VITALS — BP 121/76 | HR 79 | Resp 16 | Wt 226.6 lb

## 2022-12-30 DIAGNOSIS — R42 Dizziness and giddiness: Secondary | ICD-10-CM | POA: Diagnosis not present

## 2022-12-30 DIAGNOSIS — H6691 Otitis media, unspecified, right ear: Secondary | ICD-10-CM

## 2022-12-30 DIAGNOSIS — N921 Excessive and frequent menstruation with irregular cycle: Secondary | ICD-10-CM

## 2022-12-30 DIAGNOSIS — Z9861 Coronary angioplasty status: Secondary | ICD-10-CM

## 2022-12-30 MED ORDER — MECLIZINE HCL 12.5 MG PO TABS
12.5000 mg | ORAL_TABLET | Freq: Three times a day (TID) | ORAL | 0 refills | Status: AC | PRN
Start: 2022-12-30 — End: ?

## 2022-12-30 MED ORDER — AMOXICILLIN 500 MG PO CAPS
500.0000 mg | ORAL_CAPSULE | Freq: Three times a day (TID) | ORAL | 0 refills | Status: AC
Start: 2022-12-30 — End: 2023-01-09

## 2022-12-30 MED ORDER — FLUTICASONE PROPIONATE 50 MCG/ACT NA SUSP
2.0000 | Freq: Every day | NASAL | 6 refills | Status: AC
Start: 2022-12-30 — End: ?

## 2022-12-30 NOTE — Progress Notes (Signed)
Patient ID: Kristina Krause, female   DOB: 1971/04/06, 52 y.o.   MRN: [id number]   Kristina Krause, is a 52 y.o. female  YQM:[id number]  [id number]  DOB - 1970-09-24  Chief Complaint  Patient presents with   Dizziness       Subjective:   Kristina Krause is a 52 y.o. female here today for a 4 day h/o intermittent dizziness.  She awakened with dizziness 7/21 and had nausea and vomited once.  Then she felt mostly better throughout the day with only occasional dizziness.  She has had a few more episodes of dizziness since and some headache starting behind her R ear and up the back of her head and into the front of her head.  She is concerned bc she has CAD and had a stent placed in 2016.  She denies any CP or SOB.  No vision changes.  No recent URI.  No further vomiting.  Dizziness mostly with position changes.  SOme R ear and jaw aching.  No urinary s/sx  She thinks she may be getting menopausal bc she did not have a period for 4 months then had a really heavy and long one that stopped the morning she started having dizziness.  No SOB  No problems updated.  ALLERGIES: Allergies  Allergen Reactions   Aspirin Nausea Only    Pt reports anything above 81 mg causes stomach aches     Ibuprofen Other (See Comments)    Cramping, if takes more than 800 mg   Rosuvastatin Other (See Comments)    myalgia    PAST MEDICAL HISTORY: Past Medical History:  Diagnosis Date   Coronary artery disease    DVT (deep vein thrombosis) in pregnancy    Headache    Myocardial infarct [facility])     MEDICATIONS AT HOME: Prior to Admission medications   Medication Sig Start Date End Date Taking? Authorizing Provider  amoxicillin (AMOXIL) 500 MG capsule Take 1 capsule (500 mg total) by mouth 3 (three) times daily for 10 days. 12/30/22 01/09/23 Yes Shalina Norfolk, Marzella Schlein, PA-C  fluconazole (DIFLUCAN) 150 MG tablet Take 1 tablet (150 mg total) by mouth daily. 11/18/22   Grayce Sessions, NP  fluticasone (FLONASE) 50  MCG/ACT nasal spray Place 2 sprays into both nostrils daily. 12/30/22  Yes Leonilda Cozby M, PA-C  meclizine (ANTIVERT) 12.5 MG tablet Take 1 tablet (12.5 mg total) by mouth 3 (three) times daily as needed for dizziness. 12/30/22  Yes Anders Simmonds, PA-C  aspirin EC 81 MG tablet Take 81 mg by mouth daily. Swallow whole.    [provider]  hydrocortisone (ANUSOL-HC) 2.5 % rectal cream Place 1 Application rectally 2 (two) times daily. 09/08/22   Grayce Sessions, NP  lisinopril (ZESTRIL) 2.5 MG tablet TAKE 1 TABLET(2.5 MG) BY MOUTH DAILY 06/25/21   Jodelle Red, MD  methocarbamol (ROBAXIN) 500 MG tablet Take 1 tablet (500 mg total) by mouth 2 (two) times daily. 12/01/22   Loetta Rough, MD  metoprolol succinate (TOPROL-XL) 25 MG 24 hr tablet Take 0.5 tablets (12.5 mg total) by mouth daily. 06/25/22   Jodelle Red, MD  rosuvastatin (CRESTOR) 5 MG tablet Take 1 tablet (5 mg total) by mouth daily. 01/01/21 07/29/22  Hilty, Lisette Abu, MD    ROS: Neg resp Neg cardiac Neg GU Neg MS Neg psych Neg neuro  Objective:   Vitals:   12/30/22 1536 12/30/22 1537  BP: 136/82 121/76  Pulse: 79   Resp: 16   SpO2:  98%   Weight: 226 lb 9.6 oz (102.8 kg)    Exam General appearance : Awake, alert, not in any distress. Speech Clear. Not toxic looking HEENT: Atraumatic and Normocephalic, pupils equally reactive to light and accomodation, EOM intact.  L TM appears normal.  R TM is bulging/congested with pale erythema.  Mild TTP in mastoid region and down R eustachian tube without swelling or LN Neck: Supple, no JVD. No cervical lymphadenopathy.  Chest: Good air entry bilaterally, CTAB.  No rales/rhonchi/wheezing CVS: S1 S2 regular, no murmurs.  Extremities: B/L Lower Ext shows no edema, both legs are warm to touch.  No asymmetry and finger to nose/heel to shin intact Neurology: Awake alert, and oriented X 3, CN II-XII intact, Non focal Skin: No Rash  Data Review Lab Results   Component Value Date   HGBA1C 6.3 (H) 08/01/2022   HGBA1C 5.9 (A) 08/14/2021    Assessment & Plan   1. Dizziness No neurological red flags.  EKG with NSR and no ST changes - EKG 12-Lead - FSH/LH - TSH - CBC with Differential/Platelet  2. S/P coronary angioplasty Reason for EKG along with #1.  Follow up with cardiology.  To ED/call 911 if CP/SOB/cardiac s/sx develop.  Patient verbalizes understanding  3. Acute bacterial middle ear infection, right - meclizine (ANTIVERT) 12.5 MG tablet; Take 1 tablet (12.5 mg total) by mouth 3 (three) times daily as needed for dizziness.  Dispense: 30 tablet; Refill: 0 - amoxicillin (AMOXIL) 500 MG capsule; Take 1 capsule (500 mg total) by mouth 3 (three) times daily for 10 days.  Dispense: 30 capsule; Refill: 0 - fluticasone (FLONASE) 50 MCG/ACT nasal spray; Place 2 sprays into both nostrils daily.  Dispense: 16 g; Refill: 6  4. Menorrhagia with irregular cycle - FSH/LH - TSH - CBC with Differential/Platelet    Return for see MIchelle next month as planned.  The patient was given clear instructions to go to ER or return to medical center if symptoms don't improve, worsen or new problems develop. The patient verbalized understanding. The patient was told to call to get lab results if they haven't heard anything in the next week.      Georgian Co, PA-C [facility] [place], [place] [phone]   12/30/2022, 3:58 PM

## 2022-12-30 NOTE — Patient Instructions (Addendum)
Drink 64 to 80 ounces water daily  Dizziness Dizziness is a common problem. It makes you feel unsteady or light-headed. You may feel like you are about to pass out (faint). Dizziness can lead to getting hurt if you stumble or fall. Dizziness can be caused by many things, including: Medicines. Not having enough water in your body (dehydration). Illness. Follow these instructions at home: Eating and drinking  Drink enough fluid to keep your pee (urine) pale yellow. This helps to keep you from getting dehydrated. Try to drink more clear fluids, such as water. Do not drink alcohol. Limit how much caffeine you drink or eat, if your doctor tells you to do that. Limit how much salt (sodium) you drink or eat, if your doctor tells you to do that. Activity  Avoid making quick movements. Stand up slowly from sitting in a chair, and steady yourself until you feel okay. In the morning, first sit up on the side of the bed. When you feel okay, stand up slowly while you hold onto something. Do this until you know that your balance is okay. If you need to stand in one place for a long time, move your legs often. Tighten and relax the muscles in your legs while you are standing. Do not drive or use machinery if you feel dizzy. Avoid bending down if you feel dizzy. Place items in your home so you can reach them easily without leaning over. Lifestyle Do not smoke or use any products that contain nicotine or tobacco. If you need help quitting, ask your doctor. Try to lower your stress level. You can do this by using methods such as yoga or meditation. Talk with your doctor if you need help. General instructions Watch your dizziness for any changes. Take over-the-counter and prescription medicines only as told by your doctor. Talk with your doctor if you think that you are dizzy because of a medicine that you are taking. Tell a friend or a family member that you are feeling dizzy. If he or she notices any  changes in your behavior, have this person call your doctor. Keep all follow-up visits. Contact a doctor if: Your dizziness does not go away. Your dizziness or light-headedness gets worse. You feel like you may vomit (are nauseous). You have trouble hearing. You have new symptoms. You are unsteady on your feet. You feel like the room is spinning. You have neck pain or a stiff neck. You have a fever. Get help right away if: You vomit or have watery poop (diarrhea), and you cannot eat or drink anything. You have trouble: Talking. Walking. Swallowing. Using your arms, hands, or legs. You feel generally weak. You are not thinking clearly, or you have trouble forming sentences. A friend or family member may notice this. You have: Chest pain. Pain in your belly (abdomen). Shortness of breath. Sweating. Your vision changes. You are bleeding. You have a very bad headache. These symptoms may be an emergency. Get help right away. Call your local emergency services (911 in the [place] Do not wait to see if the symptoms will go away. Do not drive yourself to the hospital. Summary Dizziness makes you feel unsteady or light-headed. You may feel like you are about to pass out (faint). Drink enough fluid to keep your pee (urine) pale yellow. Do not drink alcohol. Avoid making quick movements if you feel dizzy. Watch your dizziness for any changes. This information is not intended to replace advice given to you by your health  care provider. Make sure you discuss any questions you have with your health care provider. Document Revised: 04/26/2020 Document Reviewed: 04/29/2020 Elsevier Patient Education  2024 [employer] Middle Ear Infection Adult  Ear barotrauma is an injury to the middle ear. This happens when there is a change in air pressure. What are the causes? Flying in an airplane. Going deeper or coming to the surface too quickly when scuba diving. Going to a high place (high  altitude) quickly. Being too close to an explosion or blast. What increases the risk? An infection in your middle ear. A sinus infection. A cold. An allergy to something around you. Having small tubes that lead from the middle ear to the back of the nose (eustachian tubes). Recent ear surgery. What are the signs or symptoms? Symptoms of this condition include: Ear pain. Sudden loss of hearing. This may be partial or complete. A feeling that your ear is clogged. Ringing in your ears. Other symptoms may include: Dizziness that creates a spinning, rocking, or tumbling feeling. Loss of balance. Feeling like you may vomit (nausea). Headache. Pain in your face. How is this treated? Medicines. Using a method to "pop" your ears or make the pressure in the middle ear the same as in the outer ear. Ways to do this include: Yawning. Chewing gum. Swallowing. Holding your nose closed and gently blowing. Surgery. This is done in very bad cases. Follow these instructions at home: Medicines Take over-the-counter and prescription medicines only as told by your doctor. Do not put anything into your ears to clean or unplug them. Activity Until your doctor says it is okay: Do not travel to high places. Do not fly in an airplane. Do not scuba dive. How is this prevented? Avoid doing things that cause pressure changes when you have symptoms of a cold or a stuffy nose. If you have a stuffy nose and will be flying, use a medicine that helps a stuffy nose (nasal decongestant) about 30-60 minutes before flying. When you fly: Chew gum. Swallow often and swallow hard during takeoff and landing. Hold your nose closed and gently blow to "pop" your ears. Yawn during air pressure changes. If scuba diving, dive feet first and "pop" your ears often as you go deeper in the water. Contact a doctor if: Your symptoms get worse. Your symptoms do not get better. You have new symptoms. You have a  fever. Get help right away if: You have very bad ear pain. You have a very bad headache. You are very dizzy. You have blood or pus coming from your ear. You have very bad balance problems. You cannot move or feel part of your face. Summary Ear barotrauma is an injury to the middle ear. This condition may be treated with medicines or by "popping" your ears. You can take steps to help prevent this condition. This information is not intended to replace advice given to you by your health care provider. Make sure you discuss any questions you have with your health care provider. Document Revised: 09/04/2019 Document Reviewed: 09/04/2019 Elsevier Patient Education  2024 [employer].

## 2022-12-31 LAB — CBC WITH DIFFERENTIAL/PLATELET
Basophils Absolute: 0.1 10*3/uL (ref 0.0–0.2)
Basos: 1 %
EOS (ABSOLUTE): 0.1 10*3/uL (ref 0.0–0.4)
Hematocrit: 38.1 % (ref 34.0–46.6)
Hemoglobin: 13.1 g/dL (ref 11.1–15.9)
Immature Grans (Abs): 0 10*3/uL (ref 0.0–0.1)
Immature Granulocytes: 0 %
Lymphocytes Absolute: 2.7 10*3/uL (ref 0.7–3.1)
Lymphs: 42 %
MCH: 31.5 pg (ref 26.6–33.0)
MCHC: 34.4 g/dL (ref 31.5–35.7)
MCV: 92 fL (ref 79–97)
Monocytes Absolute: 0.5 10*3/uL (ref 0.1–0.9)
Monocytes: 8 %
Neutrophils Absolute: 3.1 10*3/uL (ref 1.4–7.0)
Neutrophils: 48 %
RDW: 12.5 % (ref 11.7–15.4)
WBC: 6.4 10*3/uL (ref 3.4–10.8)

## 2022-12-31 LAB — TSH: TSH: 1.71 u[IU]/mL (ref 0.450–4.500)

## 2022-12-31 LAB — FSH/LH: FSH: 29.1 m[IU]/mL

## 2023-01-13 ENCOUNTER — Encounter (INDEPENDENT_AMBULATORY_CARE_PROVIDER_SITE_OTHER): Payer: Commercial Managed Care - PPO | Admitting: Primary Care

## 2023-01-13 ENCOUNTER — Ambulatory Visit (INDEPENDENT_AMBULATORY_CARE_PROVIDER_SITE_OTHER): Payer: Commercial Managed Care - PPO | Admitting: Primary Care

## 2023-01-28 ENCOUNTER — Emergency Department (HOSPITAL_COMMUNITY)
Admission: EM | Admit: 2023-01-28 | Discharge: 2023-01-28 | Disposition: A | Discharge status: Home / Self Care | Payer: Commercial Managed Care - PPO | Attending: Emergency Medicine | Admitting: Emergency Medicine

## 2023-01-28 ENCOUNTER — Other Ambulatory Visit: Payer: Self-pay

## 2023-01-28 ENCOUNTER — Emergency Department (HOSPITAL_COMMUNITY): Payer: Commercial Managed Care - PPO

## 2023-01-28 ENCOUNTER — Encounter (HOSPITAL_COMMUNITY): Payer: Self-pay | Admitting: Emergency Medicine

## 2023-01-28 DIAGNOSIS — R600 Localized edema: Secondary | ICD-10-CM | POA: Diagnosis not present

## 2023-01-28 DIAGNOSIS — R0789 Other chest pain: Secondary | ICD-10-CM | POA: Insufficient documentation

## 2023-01-28 DIAGNOSIS — Z79899 Other long term (current) drug therapy: Secondary | ICD-10-CM | POA: Insufficient documentation

## 2023-01-28 DIAGNOSIS — Z7982 Long term (current) use of aspirin: Secondary | ICD-10-CM | POA: Diagnosis not present

## 2023-01-28 DIAGNOSIS — I2510 Atherosclerotic heart disease of native coronary artery without angina pectoris: Secondary | ICD-10-CM | POA: Insufficient documentation

## 2023-01-28 DIAGNOSIS — R079 Chest pain, unspecified: Secondary | ICD-10-CM

## 2023-01-28 LAB — TROPONIN I (HIGH SENSITIVITY)
Troponin I (High Sensitivity): 3 ng/L (ref <18)
Troponin I (High Sensitivity): 3 ng/L (ref <18)

## 2023-01-28 LAB — BASIC METABOLIC PANEL
Anion gap: 7 (ref 5–15)
BUN: 12 mg/dL (ref 6–20)
CO2: 25 mmol/L (ref 22–32)
Calcium: 8.8 mg/dL — ABNORMAL LOW (ref 8.9–10.3)
Chloride: 104 mmol/L (ref 98–111)
Creatinine, Ser: 0.63 mg/dL (ref 0.44–1.00)
GFR, Estimated: >60 mL/min (ref >60)
Glucose, Bld: 118 mg/dL — ABNORMAL HIGH (ref 70–99)
Potassium: 3.6 mmol/L (ref 3.5–5.1)
Sodium: 136 mmol/L (ref 135–145)

## 2023-01-28 LAB — CBC
HCT: 38.4 % (ref 36.0–46.0)
Hemoglobin: 12.7 g/dL (ref 12.0–15.0)
MCH: 31 pg (ref 26.0–34.0)
MCHC: 33.1 g/dL (ref 30.0–36.0)
MCV: 93.7 fL (ref 80.0–100.0)
Platelets: 369 10*3/uL (ref 150–400)
RBC: 4.1 MIL/uL (ref 3.87–5.11)
RDW: 12.5 % (ref 11.5–15.5)
WBC: 5.9 10*3/uL (ref 4.0–10.5)
nRBC: 0 % (ref 0.0–0.2)

## 2023-01-28 MED ORDER — IOHEXOL 350 MG/ML SOLN
100.0000 mL | Freq: Once | INTRAVENOUS | Status: AC | PRN
  Administered 2023-01-28: 100 mL via INTRAVENOUS

## 2023-01-28 MED ORDER — SODIUM CHLORIDE (PF) 0.9 % IJ SOLN
INTRAMUSCULAR | Status: AC
  Filled 2023-01-28: qty 50

## 2023-01-28 MED ORDER — ACETAMINOPHEN 325 MG PO TABS
650.0000 mg | ORAL_TABLET | Freq: Once | ORAL | Status: AC
  Administered 2023-01-28: 650 mg via ORAL
  Filled 2023-01-28: qty 2

## 2023-01-28 NOTE — ED Provider Notes (Signed)
[place] EMERGENCY DEPARTMENT AT [facility] Provider Note   CSN: [id number] Arrival date & time: 01/28/23  0348     History  Chief Complaint  Patient presents with   Chest Pain    Kristina Krause is a 52 y.o. female.  The history is provided by the patient.  Chest Pain Kristina Krause is a 52 y.o. female who presents to the Emergency Department complaining of chest pain.  She presents emergency department for evaluation of sudden sharp chest pain that woke her at 1030 when she coughed.  Pain waxes and wanes.  She states the pain will stay there for a while and then resolve.  It recurs anytime she has another cough.  She has a history of MI with stent placement in 2016 when she was in [place].  No shortness of breath, fever, abdominal pain, nausea, vomiting.  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  She is not on anticoagulation.  She does not have pain on breathing.  She states that she is not coughing routinely but every time she coughs today the pain returns.   She also has hyperlipidemia.     Home Medications Prior to Admission medications   Medication Sig Start Date End Date Taking? Authorizing Provider  fluconazole (DIFLUCAN) 150 MG tablet Take 1 tablet (150 mg total) by mouth daily. 11/18/22   Grayce Sessions, NP  aspirin EC 81 MG tablet Take 81 mg by mouth daily. Swallow whole.    [provider]  fluticasone (FLONASE) 50 MCG/ACT nasal spray Place 2 sprays into both nostrils daily. 12/30/22   Anders Simmonds, PA-C  hydrocortisone (ANUSOL-HC) 2.5 % rectal cream Place 1 Application rectally 2 (two) times daily. 09/08/22   Grayce Sessions, NP  lisinopril (ZESTRIL) 2.5 MG tablet TAKE 1 TABLET(2.5 MG) BY MOUTH DAILY 06/25/21   Jodelle Red, MD  meclizine (ANTIVERT) 12.5 MG tablet Take 1 tablet (12.5 mg total) by mouth 3 (three) times daily as needed for dizziness. 12/30/22   Anders Simmonds, PA-C   methocarbamol (ROBAXIN) 500 MG tablet Take 1 tablet (500 mg total) by mouth 2 (two) times daily. 12/01/22   Loetta Rough, MD  metoprolol succinate (TOPROL-XL) 25 MG 24 hr tablet Take 0.5 tablets (12.5 mg total) by mouth daily. 06/25/22   Jodelle Red, MD  rosuvastatin (CRESTOR) 5 MG tablet Take 1 tablet (5 mg total) by mouth daily. 01/01/21 07/29/22  Chrystie Nose, MD      Allergies    Aspirin, Ibuprofen, and Rosuvastatin    Review of Systems   Review of Systems  Cardiovascular:  Positive for chest pain.  All other systems reviewed and are negative.   Physical Exam Updated Vital Signs BP 109/60 (BP Location: Left Arm)   Pulse (!) 55   Temp 97.6 F (36.4 C) (Oral)   Resp 20   SpO2 100%  Physical Exam Vitals and nursing note reviewed.  Constitutional:      Appearance: She is well-developed.  HENT:     Head: Normocephalic and atraumatic.  Cardiovascular:     Rate and Rhythm: Normal rate and regular rhythm.     Heart sounds: No murmur heard. Pulmonary:     Effort: Pulmonary effort is normal. No respiratory distress.     Breath sounds: Normal breath sounds.  Chest:     Chest wall: No tenderness.  Abdominal:     Palpations: Abdomen is soft.  Tenderness: There is no abdominal tenderness. There is no guarding or rebound.  Musculoskeletal:        General: No tenderness.     Comments: Nonpitting edema to LLE  Skin:    General: Skin is warm and dry.  Neurological:     Mental Status: She is alert and oriented to person, place, and time.  Psychiatric:        Behavior: Behavior normal.     ED Results / Procedures / Treatments   Labs (all labs ordered are listed, but only abnormal results are displayed) Labs Reviewed  BASIC METABOLIC PANEL - Abnormal; Notable for the following components:      Result Value   Glucose, Bld 118 (*)    Calcium 8.8 (*)    All other components within normal limits  CBC  HCG, SERUM, QUALITATIVE  TROPONIN I (HIGH SENSITIVITY)   TROPONIN I (HIGH SENSITIVITY)    EKG EKG Interpretation Date/Time:  Thursday January 28 2023 04:26:45 EDT Ventricular Rate:  60 PR Interval:  148 QRS Duration:  100 QT Interval:  422 QTC Calculation: 422 R Axis:   19  Text Interpretation: Sinus rhythm Low voltage, precordial leads Anteroseptal infarct, old Confirmed by Tilden Fossa [id number]) on 01/28/2023 5:34:08 AM  Radiology CT Angio Chest PE W/Cm &/Or Wo Cm  Result Date: 01/28/2023 CLINICAL DATA:  Pulmonary embolism (PE) suspected, high prob EXAM: CT ANGIOGRAPHY CHEST WITH CONTRAST TECHNIQUE: Multidetector CT imaging of the chest was performed using the standard protocol during bolus administration of intravenous contrast. Multiplanar CT image reconstructions and MIPs were obtained to evaluate the vascular anatomy. RADIATION DOSE REDUCTION: This exam was performed according to the departmental dose-optimization program which includes automated exposure control, adjustment of the mA and/or kV according to patient size and/or use of iterative reconstruction technique. CONTRAST:  OMNIPAQUE IOHEXOL 350 MG/ML SOLN COMPARISON:  CTA Chest 07/03/20 FINDINGS: Cardiovascular: Satisfactory opacification of the pulmonary arteries to the segmental level. No evidence of pulmonary embolism. Normal heart size. No pericardial effusion. Moderate to severe coronary artery calcifications. Mediastinum/Nodes: No enlarged mediastinal, hilar, or axillary lymph nodes. Thyroid gland, trachea, and esophagus demonstrate no significant findings. Lungs/Pleura: No pleural effusion. No pneumothorax. No focal consolidative opacity. Central airways are patent. There are scattered 3-4 mm solid pulmonary nodules, for example in the right middle lobe (series 13, image 49). Upper Abdomen: No acute abnormality. There is likely a small splenule adjacent to the spleen Musculoskeletal: Asymmetric fibroglandular tissue in the right breast. Review of the MIP images confirms the  above findings. IMPRESSION: 1. No evidence of pulmonary embolism. 2. Moderate to severe coronary artery calcifications. Aortic Atherosclerosis (ICD10-I70.0). Electronically Signed   By: Lorenza Cambridge M.D.   On: 01/28/2023 08:03   DG Chest 2 View  Result Date: 01/28/2023 CLINICAL DATA:  52 year old female with history of chest pain. Cough. EXAM: CHEST - 2 VIEW COMPARISON:  Chest x-ray 02/05/2022. FINDINGS: Lung volumes are normal. No consolidative airspace disease. No pleural effusions. No pneumothorax. No pulmonary nodule or mass noted. Pulmonary vasculature and the cardiomediastinal silhouette are within normal limits. IMPRESSION: No radiographic evidence of acute cardiopulmonary disease. Electronically Signed   By: Trudie Reed M.D.   On: 01/28/2023 05:32    Procedures Procedures    Medications Ordered in ED Medications  acetaminophen (TYLENOL) tablet 650 mg (650 mg Oral Given 01/28/23 0616)  iohexol (OMNIPAQUE) 350 MG/ML injection 100 mL (100 mLs Intravenous Contrast Given 01/28/23 0719)    ED Course/ Medical Decision Making/ A&P  Medical Decision Making Amount and/or Complexity of Data Reviewed Labs: ordered. Radiology: ordered.  Risk OTC drugs. Prescription drug management.   Patient with history of DVT not on anticoagulation, coronary artery disease status post stenting, hyperlipidemia here for evaluation of chest pain that woke her from sleep with cough.  EKG does not have acute ischemic changes.  Her initial troponin is negative.  She does have asymmetric lower extremity edema, which she states is chronic.  Given her history of DVT and not currently on anticoagulation will obtain CTA to rule out PE.  Patient care transferred pending CTA and repeat troponin.  Discussed with patient findings of studies to this point and recommended ongoing workup.  She was treated with acetaminophen for her pain.        Final Clinical Impression(s) / ED  Diagnoses Final diagnoses:  None    Rx / DC Orders ED Discharge Orders     None         Tilden Fossa, MD 01/28/23 [phone]

## 2023-01-28 NOTE — ED Provider Notes (Signed)
  Physical Exam  BP (!) 141/76 (BP Location: Right Arm)   Pulse 63   Temp 98.1 F (36.7 C) (Oral)   Resp 16   SpO2 100%   Physical Exam  Procedures  Procedures  ED Course / MDM   Clinical Course as of 01/28/23 0937  Thu Jan 28, 2023  9629 CT angio chest reviewed and interpreted and demonstrates of pulmonary embolism noted with moderate to severe coronary artery calcifications [DR]  0937 Troponin and repeat troponin are both normal [DR]    Clinical Course User Index [DR] Margarita Grizzle, MD   Medical Decision Making Amount and/or Complexity of Data Reviewed Labs: ordered. Radiology: ordered.  Risk OTC drugs. Prescription drug management.   52 yo female ho cad, ho dvt not currently on anticoag Now with asymmetric leg edema patient states is at baseline Central sharp chest pain and some cough EKG and one trop normal Second trop and cta pending D/C if above pending CT reviewed and no evidence of PE noted.  Repeat troponin reviewed and is normal Discussed findings with patient. She is hemodynamically stable.  She voices understanding and agreement with plan.  She voices understanding of return precautions and need for follow-up     Margarita Grizzle, MD 01/28/23 [phone]

## 2023-01-28 NOTE — ED Triage Notes (Signed)
Pt reports central chest pain after coughing. Reports pain feels sharp. Hx MI

## 2023-02-12 ENCOUNTER — Other Ambulatory Visit (INDEPENDENT_AMBULATORY_CARE_PROVIDER_SITE_OTHER): Payer: Self-pay | Admitting: Primary Care

## 2023-02-12 DIAGNOSIS — I2510 Atherosclerotic heart disease of native coronary artery without angina pectoris: Secondary | ICD-10-CM

## 2023-03-08 ENCOUNTER — Ambulatory Visit: Payer: Self-pay | Admitting: Internal Medicine

## 2023-05-10 NOTE — Progress Notes (Unsigned)
  Cardiology Office Note:  .   Date:  05/10/2023  ID:  Kristina Krause, DOB [birth year], MRN [id number] PCP: Grayce Sessions, NP  [facility] Providers Cardiologist:  Jodelle Red, MD { Click to update primary MD,subspecialty MD or APP then REFRESH:1}   Patient Profile: .      PMH Coronary artery disease S/p PCI in 2016 in [place] (no records) DVT during pregnancy in remote past, recurrent unprovoked DVT 2022 Panic attacks Hyperlipidemia Statin intolerance  Referred to advanced lipid disorder clinic and seen by Dr. Rennis Golden 01/01/2021.  History of coronary artery disease with prior PCI in 2016 in [place].  History of recurrent DVT initially during pregnancy and more recently developed an age-indeterminate DVT after having asymmetric swelling.  She was on Eliquis for this but discontinued it although she should have continued it for an indefinite.  Due to current guidelines.  She said she had some side effects and discontinued the medication.  She was previously on atorvastatin but had side effects and was concerned about the risk of developing diabetes.  She said both parents were on cholesterol medications but did not develop heart disease until their 40s or 80s both suffering MIs.  High cholesterol as noted in her family.  Most recent lipid panel at that time showed total cholesterol 296, HDL 52, LDL 222, and triglycerides 123.  She says she was advised to take rosuvastatin but never received a prescription she was not on any lipid-lowering therapy at that time.  She was started on rosuvastatin 5 mg daily with plan to recheck lipids in 3 months.  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  At follow-up visit 10/26/2022 with Dr. Rennis Golden, she reported she had not tolerated statin even at low-dose.  Plan was to pursue PCSK9 inhibitor therapy with lipid NMR and blood test for LP(a) with plan for follow-up in 4 months.       History of Present Illness: .   Kristina Krause  is a *** 52 y.o. female ***   Discussed the use of AI scribe software for clinical note transcription with the patient, who gave verbal consent to proceed.   ROS: ***       Studies Reviewed: .        *** Risk Assessment/Calculations:   {Does this patient have ATRIAL FIBRILLATION?[phone]} No BP recorded.  {Refresh Note OR Click here to enter BP  :1}***       Physical Exam:   VS:  There were no vitals taken for this visit.   Wt Readings from Last 3 Encounters:  12/30/22 226 lb 9.6 oz (102.8 kg)  12/01/22 215 lb (97.5 kg)  10/26/22 205 lb (93 kg)    GEN: Well nourished, well developed in no acute distress NECK: No JVD; No carotid bruits CARDIAC: ***RRR, no murmurs, rubs, gallops RESPIRATORY:  Clear to auscultation without rales, wheezing or rhonchi  ABDOMEN: Soft, non-tender, non-distended EXTREMITIES:  No edema; No deformity     ASSESSMENT AND PLAN: .    Dyslipidemia LDL goal < 70: CAD:     {Are you ordering a CV Procedure (e.g. stress test, cath, DCCV, TEE, etc)?   Press F2        :[id number]}  Dispo: ***  Signed, Eligha Bridegroom, NP-C

## 2023-05-12 ENCOUNTER — Encounter (HOSPITAL_BASED_OUTPATIENT_CLINIC_OR_DEPARTMENT_OTHER): Payer: Self-pay | Admitting: Nurse Practitioner

## 2024-01-06 ENCOUNTER — Other Ambulatory Visit: Payer: Self-pay | Admitting: Family

## 2024-01-06 DIAGNOSIS — N926 Irregular menstruation, unspecified: Secondary | ICD-10-CM

## 2024-01-07 ENCOUNTER — Ambulatory Visit
Admission: RE | Admit: 2024-01-07 | Discharge: 2024-01-07 | Disposition: A | Discharge status: Home / Self Care | Payer: Self-pay | Source: Ambulatory Visit | Attending: Family | Admitting: Family

## 2024-01-07 DIAGNOSIS — N926 Irregular menstruation, unspecified: Secondary | ICD-10-CM
